# Patient Record
Sex: Male | Born: 1945 | Race: White | Hispanic: No | Marital: Single | Smoking: Former smoker
Health system: Southern US, Community
[De-identification: ages and names within clinical notes are randomized; demographics above are authoritative.]

## PROBLEM LIST (undated history)

## (undated) DIAGNOSIS — D649 Anemia, unspecified: Secondary | ICD-10-CM

## (undated) DIAGNOSIS — M1990 Unspecified osteoarthritis, unspecified site: Secondary | ICD-10-CM

## (undated) DIAGNOSIS — B1920 Unspecified viral hepatitis C without hepatic coma: Secondary | ICD-10-CM

## (undated) DIAGNOSIS — K7460 Unspecified cirrhosis of liver: Secondary | ICD-10-CM

## (undated) DIAGNOSIS — K219 Gastro-esophageal reflux disease without esophagitis: Secondary | ICD-10-CM

## (undated) DIAGNOSIS — I739 Peripheral vascular disease, unspecified: Secondary | ICD-10-CM

## (undated) DIAGNOSIS — Z973 Presence of spectacles and contact lenses: Secondary | ICD-10-CM

## (undated) DIAGNOSIS — I10 Essential (primary) hypertension: Secondary | ICD-10-CM

## (undated) DIAGNOSIS — G47419 Narcolepsy without cataplexy: Secondary | ICD-10-CM

## (undated) DIAGNOSIS — R7989 Other specified abnormal findings of blood chemistry: Secondary | ICD-10-CM

## (undated) DIAGNOSIS — Z972 Presence of dental prosthetic device (complete) (partial): Secondary | ICD-10-CM

## (undated) HISTORY — PX: TONSILLECTOMY: SUR1361

## (undated) HISTORY — PX: UPPER GI ENDOSCOPY: SHX6162

## (undated) HISTORY — PX: EYE SURGERY: SHX253

## (undated) HISTORY — PX: COLONOSCOPY: SHX174

## (undated) HISTORY — PX: APPENDECTOMY: SHX54

---
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
## 2000-08-25 ENCOUNTER — Ambulatory Visit (HOSPITAL_BASED_OUTPATIENT_CLINIC_OR_DEPARTMENT_OTHER): Admission: RE | Admit: 2000-08-25 | Discharge: 2000-08-25 | Payer: Self-pay | Admitting: Internal Medicine

## 2002-01-25 ENCOUNTER — Encounter: Payer: Self-pay | Admitting: *Deleted

## 2002-01-25 ENCOUNTER — Encounter (INDEPENDENT_AMBULATORY_CARE_PROVIDER_SITE_OTHER): Payer: Self-pay | Admitting: *Deleted

## 2002-01-25 ENCOUNTER — Ambulatory Visit (HOSPITAL_COMMUNITY): Admission: RE | Admit: 2002-01-25 | Discharge: 2002-01-25 | Payer: Self-pay | Admitting: *Deleted

## 2004-11-17 ENCOUNTER — Ambulatory Visit: Payer: Self-pay | Admitting: Internal Medicine

## 2004-12-02 ENCOUNTER — Ambulatory Visit (HOSPITAL_COMMUNITY): Admission: RE | Admit: 2004-12-02 | Discharge: 2004-12-02 | Payer: Self-pay | Admitting: Internal Medicine

## 2004-12-14 ENCOUNTER — Ambulatory Visit: Payer: Self-pay | Admitting: Internal Medicine

## 2005-01-07 ENCOUNTER — Ambulatory Visit: Payer: Self-pay | Admitting: Gastroenterology

## 2005-01-21 ENCOUNTER — Ambulatory Visit: Payer: Self-pay | Admitting: Gastroenterology

## 2005-02-04 ENCOUNTER — Ambulatory Visit: Payer: Self-pay | Admitting: Internal Medicine

## 2005-02-18 ENCOUNTER — Ambulatory Visit: Payer: Self-pay | Admitting: Internal Medicine

## 2005-03-18 ENCOUNTER — Ambulatory Visit: Payer: Self-pay | Admitting: Gastroenterology

## 2005-04-15 ENCOUNTER — Ambulatory Visit: Payer: Self-pay | Admitting: Gastroenterology

## 2005-05-13 ENCOUNTER — Ambulatory Visit: Payer: Self-pay | Admitting: Gastroenterology

## 2005-05-31 ENCOUNTER — Ambulatory Visit (HOSPITAL_COMMUNITY): Admission: RE | Admit: 2005-05-31 | Discharge: 2005-05-31 | Payer: Self-pay | Admitting: Obstetrics and Gynecology

## 2005-06-10 ENCOUNTER — Ambulatory Visit: Payer: Self-pay | Admitting: Gastroenterology

## 2005-07-08 ENCOUNTER — Ambulatory Visit: Payer: Self-pay | Admitting: Gastroenterology

## 2005-08-18 ENCOUNTER — Ambulatory Visit: Payer: Self-pay | Admitting: Oncology

## 2010-01-21 ENCOUNTER — Ambulatory Visit: Payer: Self-pay | Admitting: Oncology

## 2010-01-27 LAB — CBC WITH DIFFERENTIAL/PLATELET
BASO%: 0.4 % (ref 0.0–2.0)
Basophils Absolute: 0 10*3/uL (ref 0.0–0.1)
EOS%: 2.4 % (ref 0.0–7.0)
Eosinophils Absolute: 0.1 10*3/uL (ref 0.0–0.5)
HCT: 41.8 % (ref 38.4–49.9)
HGB: 14.9 g/dL (ref 13.0–17.1)
LYMPH%: 28.9 % (ref 14.0–49.0)
MCH: 35.2 pg — ABNORMAL HIGH (ref 27.2–33.4)
MCHC: 35.6 g/dL (ref 32.0–36.0)
MCV: 98.8 fL — ABNORMAL HIGH (ref 79.3–98.0)
MONO#: 0.3 10*3/uL (ref 0.1–0.9)
MONO%: 8 % (ref 0.0–14.0)
NEUT#: 2.4 10*3/uL (ref 1.5–6.5)
NEUT%: 60.3 % (ref 39.0–75.0)
Platelets: 53 10*3/uL — ABNORMAL LOW (ref 140–400)
RBC: 4.23 10*6/uL (ref 4.20–5.82)
RDW: 14 % (ref 11.0–14.6)
WBC: 4 10*3/uL (ref 4.0–10.3)
lymph#: 1.1 10*3/uL (ref 0.9–3.3)

## 2010-01-27 LAB — MORPHOLOGY
Other Comments: NONE SEEN
PLT EST: DECREASED

## 2010-01-28 LAB — COMPREHENSIVE METABOLIC PANEL
ALT: 86 U/L — ABNORMAL HIGH (ref 0–53)
AST: 63 U/L — ABNORMAL HIGH (ref 0–37)
Albumin: 3.7 g/dL (ref 3.5–5.2)
BUN: 20 mg/dL (ref 6–23)
CO2: 28 mEq/L (ref 19–32)
Calcium: 8.4 mg/dL (ref 8.4–10.5)
Chloride: 103 mEq/L (ref 96–112)
Creatinine, Ser: 1.21 mg/dL (ref 0.40–1.50)
Potassium: 3.8 mEq/L (ref 3.5–5.3)

## 2010-01-28 LAB — LACTATE DEHYDROGENASE: LDH: 160 U/L (ref 94–250)

## 2010-01-28 LAB — ANA: Anti Nuclear Antibody(ANA): NEGATIVE

## 2010-02-03 ENCOUNTER — Ambulatory Visit (HOSPITAL_COMMUNITY): Admission: RE | Admit: 2010-02-03 | Discharge: 2010-02-03 | Payer: Self-pay | Admitting: Oncology

## 2010-04-17 ENCOUNTER — Ambulatory Visit: Payer: Self-pay | Admitting: Gastroenterology

## 2010-07-30 ENCOUNTER — Ambulatory Visit: Payer: Self-pay | Admitting: Oncology

## 2010-08-13 ENCOUNTER — Ambulatory Visit: Payer: Self-pay | Admitting: Gastroenterology

## 2010-08-25 ENCOUNTER — Ambulatory Visit (HOSPITAL_COMMUNITY): Admission: RE | Admit: 2010-08-25 | Discharge: 2010-08-25 | Payer: Self-pay | Admitting: Gastroenterology

## 2011-02-04 ENCOUNTER — Ambulatory Visit (INDEPENDENT_AMBULATORY_CARE_PROVIDER_SITE_OTHER): Payer: BLUE CROSS/BLUE SHIELD | Admitting: Gastroenterology

## 2011-02-04 DIAGNOSIS — K7460 Unspecified cirrhosis of liver: Secondary | ICD-10-CM

## 2011-02-04 DIAGNOSIS — B182 Chronic viral hepatitis C: Secondary | ICD-10-CM

## 2011-03-25 ENCOUNTER — Other Ambulatory Visit: Payer: Self-pay

## 2012-02-22 ENCOUNTER — Other Ambulatory Visit: Payer: Self-pay | Admitting: Dermatology

## 2012-06-14 ENCOUNTER — Other Ambulatory Visit: Payer: Self-pay | Admitting: Internal Medicine

## 2012-06-14 DIAGNOSIS — B182 Chronic viral hepatitis C: Secondary | ICD-10-CM

## 2012-06-19 ENCOUNTER — Other Ambulatory Visit: Payer: Self-pay | Admitting: Dermatology

## 2012-06-20 ENCOUNTER — Other Ambulatory Visit: Payer: BLUE CROSS/BLUE SHIELD

## 2012-06-27 ENCOUNTER — Ambulatory Visit
Admission: RE | Admit: 2012-06-27 | Discharge: 2012-06-27 | Disposition: A | Discharge status: Home / Self Care | Payer: BLUE CROSS/BLUE SHIELD | Source: Ambulatory Visit | Attending: Internal Medicine | Admitting: Internal Medicine

## 2012-06-27 DIAGNOSIS — B182 Chronic viral hepatitis C: Secondary | ICD-10-CM

## 2012-08-16 ENCOUNTER — Emergency Department (HOSPITAL_COMMUNITY)
Admission: EM | Admit: 2012-08-16 | Discharge: 2012-08-16 | Disposition: A | Discharge status: Home / Self Care | Payer: BC Managed Care – PPO | Attending: Emergency Medicine | Admitting: Emergency Medicine

## 2012-08-16 ENCOUNTER — Encounter (HOSPITAL_COMMUNITY): Payer: Self-pay

## 2012-08-16 DIAGNOSIS — H538 Other visual disturbances: Secondary | ICD-10-CM | POA: Insufficient documentation

## 2012-08-16 DIAGNOSIS — H539 Unspecified visual disturbance: Secondary | ICD-10-CM

## 2012-08-16 DIAGNOSIS — Z79899 Other long term (current) drug therapy: Secondary | ICD-10-CM | POA: Insufficient documentation

## 2012-08-16 DIAGNOSIS — B1710 Acute hepatitis C without hepatic coma: Secondary | ICD-10-CM | POA: Insufficient documentation

## 2012-08-16 DIAGNOSIS — Z8739 Personal history of other diseases of the musculoskeletal system and connective tissue: Secondary | ICD-10-CM | POA: Insufficient documentation

## 2012-08-16 DIAGNOSIS — F17200 Nicotine dependence, unspecified, uncomplicated: Secondary | ICD-10-CM | POA: Insufficient documentation

## 2012-08-16 HISTORY — DX: Unspecified viral hepatitis C without hepatic coma: B19.20

## 2012-08-16 HISTORY — DX: Unspecified osteoarthritis, unspecified site: M19.90

## 2012-08-16 NOTE — ED Notes (Signed)
Pt states that motor skills have not been effected.
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
## 2012-08-16 NOTE — ED Notes (Signed)
Pt ambulatory leaving ED by self. Pt given discharge instructions. Pt does not have any furhter questions upon discharge. Pt does not appear to be in any acute distress.

## 2012-08-16 NOTE — ED Notes (Signed)
Pt states peripheral vision is impaired on right side. Pt states vision currently "more than adequate." Pt denies dizziness and  lightheadedness. Pt states little nausea earlier today. Pt denies pain.

## 2012-08-16 NOTE — ED Notes (Signed)
C. Schinlever, PA at bedside.  

## 2012-08-16 NOTE — ED Notes (Signed)
Pt reports blurry vision to (R) peripheral field starting yesterday. Pt reports he was on the computer yesterday when he noticed his sight was originally blurry then would gradually become clear. Pt also reports some difficulty w/typing yesterday. Pt denies slurred speech, dizziness, or headache. No neuro deficits noted, grips and strengths equal bilaterally
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
## 2013-05-23 ENCOUNTER — Other Ambulatory Visit: Payer: Self-pay | Admitting: Gastroenterology

## 2013-05-23 DIAGNOSIS — B182 Chronic viral hepatitis C: Secondary | ICD-10-CM

## 2013-05-23 DIAGNOSIS — K7460 Unspecified cirrhosis of liver: Secondary | ICD-10-CM

## 2013-06-04 ENCOUNTER — Ambulatory Visit
Admission: RE | Admit: 2013-06-04 | Discharge: 2013-06-04 | Disposition: A | Discharge status: Home / Self Care | Payer: BLUE CROSS/BLUE SHIELD | Source: Ambulatory Visit | Attending: Gastroenterology | Admitting: Gastroenterology

## 2013-06-04 DIAGNOSIS — B182 Chronic viral hepatitis C: Secondary | ICD-10-CM

## 2013-06-04 DIAGNOSIS — K7460 Unspecified cirrhosis of liver: Secondary | ICD-10-CM

## 2013-07-27 ENCOUNTER — Encounter (HOSPITAL_BASED_OUTPATIENT_CLINIC_OR_DEPARTMENT_OTHER): Payer: Self-pay | Admitting: *Deleted

## 2013-07-27 NOTE — Progress Notes (Signed)
Pt long hx cirrhosis-hepc-was treated To come in for bmet To bring all meds and overnight bag in case he has to stay

## 2013-07-30 ENCOUNTER — Other Ambulatory Visit: Payer: Self-pay | Admitting: Orthopedic Surgery

## 2013-08-03 NOTE — Progress Notes (Signed)
Surgery r/s from 08/02/13 to 08/09/13=pt saw pcp 08/02/13-labs done will get new labs preop reviewed

## 2013-08-07 ENCOUNTER — Other Ambulatory Visit: Payer: Self-pay | Admitting: Orthopedic Surgery

## 2013-08-08 NOTE — H&P (Signed)
Lareen Mullings/[facility] [address]   [address] [place], [place] [address] [phone] A Division of [facility]  Loreta Ave, M.D.   Robert A. Thurston Hole, M.D.   Burnell Blanks, M.D.   Eulas Post, M.D.   Lunette Stands, M.D Buford Dresser, M.D.  Charlsie Quest, M.D.   Estell Harpin, M.D.   Melina Fiddler, M.D. Genene Churn. Barry Dienes, PA-C            Kirstin A. Shepperson, PA-C Josh Media, PA-C [place], [place]   RE: Amarrion, Pastorino                                [phone]      DOB: [birth year] PROGRESS NOTE: 02-13-13 Elgie comes in for an acute injury to his left shoulder.  He was lifting a heavy object and felt pain and tearing sensation in his left shoulder referred down to the deltoid attachment.  Pain also down his biceps, which continues to be painful.  This has been three weeks.  He is getting worse rather than better.  Some help with anti-inflammatories.  He has not been able to really get his arm overhead since his initial injury.  No neck pain.  No radicular complaints.  Prior to this event no significant problems with his shoulder.  He comes in for evaluation and treatment recommendation.  He is a healthy, active 67 year-old male.   Status post right radial tunnel release by me back the end of last year.  Doing well without any issues.  Definitely improved after that procedure. Remaining history is reviewed, updated and included in the chart.   EXAMINATION: General exam is outlined and included in the chart.  Specifically, he has a virtual drop arm on the left.  Marked cuff weakness.  Also a little bit of biceps weakness, but I think the long head is still intact.  Positive impingement.  No instability.  No dramatic atrophy.  Passively I can get him through fairly good motion, but actively he cannot get his arm overhead with abduction.  On the right his incision from radial tunnel release has healed.  No residual  symptoms.    X-RAYS: Three view x-ray of the injured left shoulder shows a Type III acromion.  Degenerative changes in the [facility] joint.  Glenohumeral joint and subacromial space reasonable.    DISPOSITION:  I am very concerned he has a complete rotator cuff tear.  Rather than delaying diagnosis and treatment I would rather go ahead and get a scan now.  I have discussed this with him and based on his findings I think it is definitely indicated.  We will proceed with that and he will follow up with me when that is complete.  Tailor further treatment depending on what that shows.    Loreta Ave, M.D.   Electronically verified by Loreta Ave, M.D. DFM:jjh D 02-13-13 T 02-14-13  Devonda Pequignot/[facility] [address]   [address] [place], [place] [address] [phone] A Division of [facility]  Loreta Ave, M.D.   Robert A. Thurston Hole, M.D.   Burnell Blanks, M.D.   Eulas Post, M.D.   Lunette Stands, M.D Buford Dresser, M.D.  Charlsie Quest, M.D.   Estell Harpin, M.D.   Melina Fiddler, M.D. Genene Churn. Barry Dienes, PA-C  Kirstin A. Shepperson, PA-C Josh [place], PA-C [place], [place]   RE: Joelle, [place]                                [phone]      DOB: [birth year] PROGRESS NOTE: 02-16-13 Trestan returns for follow up.  I have gone over his MRI report.  Fortunately he does not have a full thickness tear.  There is cuff irritation, relatively mild.  Longitudinal tearing long head biceps, but not a complete tear.  I have gone over the MRI report and discussed it with him.  I also looked at the scan itself.  After thorough discussion we are going to try a subacromial injection to settle down inflammation and a Jobe exercise program.  We will see how he does.  Progress with activities as tolerated.  He will let me know if things don't improve.  We have also talked about potential progression to rupturing long head biceps  tendon and what would occur if that happened.  He understands and agrees.    PROCEDURE NOTE: The patient's clinical condition is marked by substantial pain and/or significant functional disability.  Other conservative therapy has not provided relief, is contraindicated, or not appropriate.  There is a reasonable likelihood that injection will significantly improve the patient's pain and/or functional disability. After appropriate consent and under sterile technique subacromial injection of the left shoulder with Depo-Medrol/Marcaine.  Tolerated this well.    Loreta Ave, M.D.   Electronically verified by Loreta Ave, M.D. DFM:jjh D 02-16-13 T 02-19-13  Atisha Hamidi/[facility] [address]   [address] [place], [place] [address] [phone] A Division of [facility]  Loreta Ave, M.D.   Robert A. Thurston Hole, M.D.   Burnell Blanks, M.D.   Eulas Post, M.D.   Lunette Stands, M.D Jewel Baize. Eulah Pont, M.D.  Buford Dresser, M.D.  Charlsie Quest, M.D.  Estell Harpin, M.D.   Melina Fiddler, M.D. Danford Bad. Willa Rough, PA-C  Kirstin A. Shepperson, PA-C  Josh Toyah, PA-C [place], [place]   RE: Jesson, [place]                                [phone]      DOB: 11-02-1969 PROGRESS NOTE: 07-20-13 Vilas comes in for his left shoulder.  Cortisone injection we did back in August helped for a week, nothing longer.  Pain has returned.  His last injection helped for more than four months.  He has rotator cuff impingement signs and symptoms, as well as marked biceps irritation.  He has reached a point that he would like to consider definitive treatment.  This is something we have discussed in the past.  Previous evaluation with x-rays and MRI scan.  Picture of impingement, rotator cuff tendinopathy, but he does not have a full thickness cuff tear.   History, workup and treatment to date is reviewed and discussed with  him.  EXAMINATION: General exam is outlined and included in the chart.  He continues to have fairly good motion.  Positive impingement.  Positive palms down abduction.  Biceps irritation, but it is not completely torn.  Neurovascularly intact.     DISPOSITION:  We have discussed definitive treatment.  Exam under anesthesia, arthroscopy, decompression.  If there is significant biceps pathology my approach would be an  arthroscopic release, not an extraarticular tenodesis, given his age of 35.  Obviously repair his cuff if a full thickness tear is found.  All of this discussed face-to-face, more than 25 minutes.  Paperwork complete.  All questions answered.  Procedure, risks, benefits and complications reviewed in detail.  I will see him at the time of operative intervention.    Loreta Ave, M.D.   Electronically verified by Loreta Ave, M.D. DFM:jjh D 07-20-13 T 07-23-13

## 2013-08-09 ENCOUNTER — Ambulatory Visit (HOSPITAL_BASED_OUTPATIENT_CLINIC_OR_DEPARTMENT_OTHER): Payer: BC Managed Care – PPO | Admitting: Anesthesiology

## 2013-08-09 ENCOUNTER — Encounter (HOSPITAL_BASED_OUTPATIENT_CLINIC_OR_DEPARTMENT_OTHER): Payer: Self-pay

## 2013-08-09 ENCOUNTER — Encounter (HOSPITAL_BASED_OUTPATIENT_CLINIC_OR_DEPARTMENT_OTHER)
Admission: RE | Disposition: A | Discharge status: Home / Self Care | Payer: Self-pay | Source: Ambulatory Visit | Attending: Orthopedic Surgery

## 2013-08-09 ENCOUNTER — Encounter (HOSPITAL_BASED_OUTPATIENT_CLINIC_OR_DEPARTMENT_OTHER): Payer: BC Managed Care – PPO | Admitting: Anesthesiology

## 2013-08-09 ENCOUNTER — Ambulatory Visit (HOSPITAL_BASED_OUTPATIENT_CLINIC_OR_DEPARTMENT_OTHER)
Admission: RE | Admit: 2013-08-09 | Discharge: 2013-08-09 | Disposition: A | Discharge status: Home / Self Care | Payer: BC Managed Care – PPO | Source: Ambulatory Visit | Attending: Orthopedic Surgery | Admitting: Orthopedic Surgery

## 2013-08-09 DIAGNOSIS — M7500 Adhesive capsulitis of unspecified shoulder: Secondary | ICD-10-CM | POA: Insufficient documentation

## 2013-08-09 DIAGNOSIS — B1920 Unspecified viral hepatitis C without hepatic coma: Secondary | ICD-10-CM | POA: Insufficient documentation

## 2013-08-09 DIAGNOSIS — M19019 Primary osteoarthritis, unspecified shoulder: Secondary | ICD-10-CM | POA: Insufficient documentation

## 2013-08-09 DIAGNOSIS — I10 Essential (primary) hypertension: Secondary | ICD-10-CM | POA: Insufficient documentation

## 2013-08-09 DIAGNOSIS — F17200 Nicotine dependence, unspecified, uncomplicated: Secondary | ICD-10-CM | POA: Insufficient documentation

## 2013-08-09 DIAGNOSIS — D696 Thrombocytopenia, unspecified: Secondary | ICD-10-CM | POA: Insufficient documentation

## 2013-08-09 DIAGNOSIS — K7460 Unspecified cirrhosis of liver: Secondary | ICD-10-CM | POA: Insufficient documentation

## 2013-08-09 DIAGNOSIS — M25512 Pain in left shoulder: Secondary | ICD-10-CM

## 2013-08-09 DIAGNOSIS — M25819 Other specified joint disorders, unspecified shoulder: Secondary | ICD-10-CM | POA: Insufficient documentation

## 2013-08-09 HISTORY — DX: Presence of dental prosthetic device (complete) (partial): Z97.2

## 2013-08-09 HISTORY — PX: SHOULDER ARTHROSCOPY WITH SUBACROMIAL DECOMPRESSION, ROTATOR CUFF REPAIR AND BICEP TENDON REPAIR: SHX5687

## 2013-08-09 HISTORY — DX: Essential (primary) hypertension: I10

## 2013-08-09 HISTORY — DX: Anemia, unspecified: D64.9

## 2013-08-09 HISTORY — DX: Presence of spectacles and contact lenses: Z97.3

## 2013-08-09 LAB — POCT I-STAT, CHEM 8
BUN: 23 mg/dL (ref 6–23)
Chloride: 101 mEq/L (ref 96–112)
Creatinine, Ser: 1.4 mg/dL — ABNORMAL HIGH (ref 0.50–1.35)
Glucose, Bld: 90 mg/dL (ref 70–99)
Hemoglobin: 14.6 g/dL (ref 13.0–17.0)
Potassium: 4.1 mEq/L (ref 3.5–5.1)

## 2013-08-09 LAB — CBC WITH DIFFERENTIAL/PLATELET
Eosinophils Absolute: 0.3 10*3/uL (ref 0.0–0.7)
Eosinophils Relative: 4 % (ref 0–5)
Hemoglobin: 14.5 g/dL (ref 13.0–17.0)
Lymphs Abs: 1.9 10*3/uL (ref 0.7–4.0)
MCH: 34.9 pg — ABNORMAL HIGH (ref 26.0–34.0)
MCV: 96.6 fL (ref 78.0–100.0)
Monocytes Relative: 8 % (ref 3–12)
Neutro Abs: 3.9 10*3/uL (ref 1.7–7.7)
Neutrophils Relative %: 59 % (ref 43–77)
RBC: 4.16 MIL/uL — ABNORMAL LOW (ref 4.22–5.81)

## 2013-08-09 SURGERY — SHOULDER ARTHROSCOPY WITH SUBACROMIAL DECOMPRESSION, ROTATOR CUFF REPAIR AND BICEP TENDON REPAIR
Anesthesia: General | Site: Shoulder | Laterality: Left | Wound class: Clean

## 2013-08-09 MED ORDER — DEXAMETHASONE SODIUM PHOSPHATE 4 MG/ML IJ SOLN
INTRAMUSCULAR | Status: DC | PRN
  Administered 2013-08-09: 10 mg via INTRAVENOUS

## 2013-08-09 MED ORDER — ACETAMINOPHEN 500 MG PO TABS
ORAL_TABLET | ORAL | Status: AC
  Filled 2013-08-09: qty 2

## 2013-08-09 MED ORDER — OXYCODONE HCL 5 MG PO TABS: 5.0000 mg | ORAL_TABLET | Freq: Once | ORAL | Status: DC | PRN

## 2013-08-09 MED ORDER — CEFAZOLIN SODIUM-DEXTROSE 2-3 GM-% IV SOLR: 2.0000 g | INTRAVENOUS | Status: DC

## 2013-08-09 MED ORDER — MIDAZOLAM HCL 2 MG/2ML IJ SOLN
INTRAMUSCULAR | Status: AC
  Filled 2013-08-09: qty 2

## 2013-08-09 MED ORDER — CHLORHEXIDINE GLUCONATE 4 % EX LIQD: 60.0000 mL | Freq: Once | CUTANEOUS | Status: DC

## 2013-08-09 MED ORDER — LACTATED RINGERS IV SOLN
INTRAVENOUS | Status: DC
  Administered 2013-08-09: 09:00:00 via INTRAVENOUS

## 2013-08-09 MED ORDER — MIDAZOLAM HCL 2 MG/2ML IJ SOLN
1.0000 mg | INTRAMUSCULAR | Status: DC | PRN
  Administered 2013-08-09: 2 mg via INTRAVENOUS

## 2013-08-09 MED ORDER — FENTANYL CITRATE 0.05 MG/ML IJ SOLN
INTRAMUSCULAR | Status: AC
  Filled 2013-08-09: qty 2

## 2013-08-09 MED ORDER — OXYCODONE HCL 5 MG/5ML PO SOLN: 5.0000 mg | Freq: Once | ORAL | Status: DC | PRN

## 2013-08-09 MED ORDER — OXYCODONE HCL 5 MG PO TABS: 5.0000 mg | ORAL_TABLET | ORAL | Status: DC | PRN

## 2013-08-09 MED ORDER — DEXTROSE-NACL 5-0.45 % IV SOLN: INTRAVENOUS | Status: DC

## 2013-08-09 MED ORDER — CEFAZOLIN SODIUM-DEXTROSE 2-3 GM-% IV SOLR
2.0000 g | INTRAVENOUS | Status: AC
  Administered 2013-08-09: 2 g via INTRAVENOUS

## 2013-08-09 MED ORDER — ACETAMINOPHEN 500 MG PO TABS: 1000.0000 mg | ORAL_TABLET | Freq: Once | ORAL | Status: DC

## 2013-08-09 MED ORDER — MIDAZOLAM HCL 2 MG/2ML IJ SOLN: 0.5000 mg | Freq: Once | INTRAMUSCULAR | Status: DC | PRN

## 2013-08-09 MED ORDER — PROMETHAZINE HCL 25 MG/ML IJ SOLN: 6.2500 mg | INTRAMUSCULAR | Status: DC | PRN

## 2013-08-09 MED ORDER — PROPOFOL 10 MG/ML IV BOLUS
INTRAVENOUS | Status: DC | PRN
  Administered 2013-08-09: 100 mg via INTRAVENOUS

## 2013-08-09 MED ORDER — BUPIVACAINE-EPINEPHRINE PF 0.5-1:200000 % IJ SOLN
INTRAMUSCULAR | Status: DC | PRN
  Administered 2013-08-09: 30 mL

## 2013-08-09 MED ORDER — FENTANYL CITRATE 0.05 MG/ML IJ SOLN
50.0000 ug | INTRAMUSCULAR | Status: DC | PRN
  Administered 2013-08-09: 100 ug via INTRAVENOUS

## 2013-08-09 MED ORDER — HYDRALAZINE HCL 20 MG/ML IJ SOLN
INTRAMUSCULAR | Status: DC | PRN
  Administered 2013-08-09: 5 mg via INTRAVENOUS

## 2013-08-09 MED ORDER — CEFAZOLIN SODIUM-DEXTROSE 2-3 GM-% IV SOLR
INTRAVENOUS | Status: AC
  Filled 2013-08-09: qty 50

## 2013-08-09 MED ORDER — FENTANYL CITRATE 0.05 MG/ML IJ SOLN
INTRAMUSCULAR | Status: DC | PRN
  Administered 2013-08-09: 25 ug via INTRAVENOUS

## 2013-08-09 MED ORDER — MEPERIDINE HCL 25 MG/ML IJ SOLN: 6.2500 mg | INTRAMUSCULAR | Status: DC | PRN

## 2013-08-09 MED ORDER — FENTANYL CITRATE 0.05 MG/ML IJ SOLN: 25.0000 ug | INTRAMUSCULAR | Status: DC | PRN

## 2013-08-09 MED ORDER — SUCCINYLCHOLINE CHLORIDE 20 MG/ML IJ SOLN
INTRAMUSCULAR | Status: DC | PRN
  Administered 2013-08-09: 100 mg via INTRAVENOUS

## 2013-08-09 SURGICAL SUPPLY — 69 items
BENZOIN TINCTURE PRP APPL 2/3 (GAUZE/BANDAGES/DRESSINGS) IMPLANT
BLADE CUTTER GATOR 3.5 (BLADE) ×2 IMPLANT
BLADE CUTTER MENIS 5.5 (BLADE) IMPLANT
BLADE GREAT WHITE 4.2 (BLADE) ×2 IMPLANT
BLADE SURG 15 STRL LF DISP TIS (BLADE) ×1 IMPLANT
BLADE SURG 15 STRL SS (BLADE) ×1
BUR OVAL 6.0 (BURR) ×2 IMPLANT
CANISTER OMNI JUG 16 LITER (MISCELLANEOUS) ×2 IMPLANT
CANISTER SUCT 3000ML (MISCELLANEOUS) IMPLANT
CANNULA DRY DOC 8X75 (CANNULA) IMPLANT
CANNULA TWIST IN 8.25X7CM (CANNULA) IMPLANT
CLOTH BEACON ORANGE TIMEOUT ST (SAFETY) ×2 IMPLANT
DECANTER SPIKE VIAL GLASS SM (MISCELLANEOUS) IMPLANT
DRAPE STERI 35X30 U-POUCH (DRAPES) ×2 IMPLANT
DRAPE U-SHAPE 47X51 STRL (DRAPES) ×2 IMPLANT
DRAPE U-SHAPE 76X120 STRL (DRAPES) ×4 IMPLANT
DRSG PAD ABDOMINAL 8X10 ST (GAUZE/BANDAGES/DRESSINGS) ×2 IMPLANT
DURAPREP 26ML APPLICATOR (WOUND CARE) ×2 IMPLANT
ELECT MENISCUS 165MM 90D (ELECTRODE) ×2 IMPLANT
ELECT NEEDLE TIP 2.8 STRL (NEEDLE) IMPLANT
ELECT REM PT RETURN 9FT ADLT (ELECTROSURGICAL) ×2
ELECTRODE REM PT RTRN 9FT ADLT (ELECTROSURGICAL) ×1 IMPLANT
GAUZE XEROFORM 1X8 LF (GAUZE/BANDAGES/DRESSINGS) ×2 IMPLANT
GLOVE BIO SURGEON STRL SZ 6.5 (GLOVE) ×2 IMPLANT
GLOVE BIO SURGEON STRL SZ8 (GLOVE) ×2 IMPLANT
GLOVE BIOGEL PI IND STRL 7.0 (GLOVE) ×1 IMPLANT
GLOVE BIOGEL PI IND STRL 8.5 (GLOVE) ×1 IMPLANT
GLOVE BIOGEL PI INDICATOR 7.0 (GLOVE) ×1
GLOVE BIOGEL PI INDICATOR 8.5 (GLOVE) ×1
GLOVE ORTHO TXT STRL SZ7.5 (GLOVE) ×2 IMPLANT
GOWN PREVENTION PLUS XLARGE (GOWN DISPOSABLE) ×2 IMPLANT
GOWN STRL REIN 2XL XLG LVL4 (GOWN DISPOSABLE) ×2 IMPLANT
IV NS IRRIG 3000ML ARTHROMATIC (IV SOLUTION) ×8 IMPLANT
NDL SUT 6 .5 CRC .975X.05 MAYO (NEEDLE) IMPLANT
NEEDLE MAYO TAPER (NEEDLE)
NEEDLE SCORPION MULTI FIRE (NEEDLE) IMPLANT
NS IRRIG 1000ML POUR BTL (IV SOLUTION) IMPLANT
PACK ARTHROSCOPY DSU (CUSTOM PROCEDURE TRAY) ×2 IMPLANT
PACK BASIN DAY SURGERY FS (CUSTOM PROCEDURE TRAY) ×2 IMPLANT
PASSER SUT SWANSON 36MM LOOP (INSTRUMENTS) IMPLANT
PENCIL BUTTON HOLSTER BLD 10FT (ELECTRODE) ×2 IMPLANT
SET ARTHROSCOPY TUBING (MISCELLANEOUS) ×1
SET ARTHROSCOPY TUBING LN (MISCELLANEOUS) ×1 IMPLANT
SLEEVE SCD COMPRESS KNEE MED (MISCELLANEOUS) IMPLANT
SLING ARM FOAM STRAP LRG (SOFTGOODS) IMPLANT
SLING ARM FOAM STRAP MED (SOFTGOODS) IMPLANT
SLING ARM FOAM STRAP XLG (SOFTGOODS) IMPLANT
SLING ARM IMMOBILIZER LRG (SOFTGOODS) IMPLANT
SLING ARM IMMOBILIZER MED (SOFTGOODS) IMPLANT
SPONGE GAUZE 4X4 12PLY (GAUZE/BANDAGES/DRESSINGS) ×4 IMPLANT
SPONGE LAP 4X18 X RAY DECT (DISPOSABLE) IMPLANT
STRIP CLOSURE SKIN 1/2X4 (GAUZE/BANDAGES/DRESSINGS) IMPLANT
SUCTION FRAZIER TIP 10 FR DISP (SUCTIONS) IMPLANT
SUT ETHIBOND 2 OS 4 DA (SUTURE) IMPLANT
SUT ETHILON 2 0 FS 18 (SUTURE) IMPLANT
SUT ETHILON 3 0 PS 1 (SUTURE) IMPLANT
SUT FIBERWIRE #2 38 T-5 BLUE (SUTURE)
SUT RETRIEVER MED (INSTRUMENTS) IMPLANT
SUT TIGER TAPE 7 IN WHITE (SUTURE) IMPLANT
SUT VIC AB 0 CT1 27 (SUTURE)
SUT VIC AB 0 CT1 27XBRD ANBCTR (SUTURE) IMPLANT
SUT VIC AB 2-0 SH 27 (SUTURE)
SUT VIC AB 2-0 SH 27XBRD (SUTURE) IMPLANT
SUT VIC AB 3-0 FS2 27 (SUTURE) IMPLANT
SUTURE FIBERWR #2 38 T-5 BLUE (SUTURE) IMPLANT
TAPE FIBER 2MM 7IN #2 BLUE (SUTURE) IMPLANT
TOWEL OR 17X24 6PK STRL BLUE (TOWEL DISPOSABLE) ×2 IMPLANT
WATER STERILE IRR 1000ML POUR (IV SOLUTION) ×2 IMPLANT
YANKAUER SUCT BULB TIP NO VENT (SUCTIONS) IMPLANT

## 2013-08-09 NOTE — Transfer of Care (Signed)
Immediate Anesthesia Transfer of Care Note  Patient: Joshua [name]  Procedure(s) Performed: Procedure(s): LEFT SHOULDER ARTHROSCOPY WITH SUBACROMIAL DECOMPRESSION, DISTAL CLAVICULECTOMY (Left)  Patient Location: PACU  Anesthesia Type:General and GA combined with regional for post-op pain  Level of Consciousness: sedated  Airway & Oxygen Therapy: Patient Spontanous Breathing and Patient connected to face mask oxygen  Post-op Assessment: Report given to PACU RN and Post -op Vital signs reviewed and stable  Post vital signs: Reviewed and stable  Complications: No apparent anesthesia complications

## 2013-08-09 NOTE — Anesthesia Procedure Notes (Addendum)
Anesthesia Regional Block:  Interscalene brachial plexus block  Pre-Anesthetic Checklist: ,, timeout performed, Correct Patient, Correct Site, Correct Laterality, Correct Procedure, Correct Position, site marked, Risks and benefits discussed,  Surgical consent,  Pre-op evaluation,  At surgeon's request and post-op pain management  Laterality: Left  Prep: chloraprep       Needles:  Injection technique: Single-shot  Needle Type: Stimulator Needle - 40      Needle Gauge: 22 and 22 G    Additional Needles:  Procedures: nerve stimulator Interscalene brachial plexus block  Nerve Stimulator or Paresthesia:  Response: forearm twitch, 0.45 mA, 0.1 ms,   Additional Responses:   Narrative:  Start time: 08/09/2013 9:49 AM End time: 08/09/2013 9:58 AM Injection made incrementally with aspirations every 5 mL.  Performed by: Personally  Anesthesiologist: Sandford Craze, MD  Additional Notes: Pt identified in Holding room.  Monitors applied. Working IV access confirmed. Sterile prep L neck.  #22ga PNS to forearm twitch at 0.28mA threshold.  30cc 0.5% Bupivacaine with 1:200k epi injected incrementally after negative test dose.  Patient asymptomatic, VSS, no heme aspirated, tolerated well.  Sandford Craze MD  Interscalene brachial plexus block Procedure Name: Intubation Performed by: York Grice Pre-anesthesia Checklist: Patient identified, Timeout performed, Emergency Drugs available, Suction available and Patient being monitored Patient Re-evaluated:Patient Re-evaluated prior to inductionOxygen Delivery Method: Circle system utilized Preoxygenation: Pre-oxygenation with 100% oxygen Intubation Type: IV induction Ventilation: Mask ventilation without difficulty Laryngoscope Size: Miller and 2 Grade View: Grade I Tube type: Oral Tube size: 8.0 mm Number of attempts: 1 Placement Confirmation: ETT inserted through vocal cords under direct vision,  breath sounds checked- equal and bilateral  and positive ETCO2 Secured at: 23 cm Tube secured with: Tape Dental Injury: Teeth and Oropharynx as per pre-operative assessment

## 2013-08-09 NOTE — Anesthesia Preprocedure Evaluation (Addendum)
Anesthesia Evaluation  Patient identified by MRN, date of birth, ID band Patient awake    Reviewed: Allergy & Precautions, H&P , NPO status , Patient's Chart, lab work & pertinent test results  History of Anesthesia Complications Negative for: history of anesthetic complications  Airway Mallampati: I TM Distance: >3 FB Neck ROM: Full    Dental  (+) Partial Upper and Dental Advisory Given   Pulmonary Current Smoker,  breath sounds clear to auscultation  Pulmonary exam normal       Cardiovascular hypertension, Pt. on medications Rhythm:Regular Rate:Normal     Neuro/Psych negative neurological ROS     GI/Hepatic negative GI ROS, (+) Cirrhosis -       , Hepatitis -, C  Endo/Other  negative endocrine ROS  Renal/GU negative Renal ROS     Musculoskeletal   Abdominal   Peds  Hematology  (+) Blood dyscrasia (thrombocytopenia: plts 82K), ,   Anesthesia Other Findings   Reproductive/Obstetrics                         Anesthesia Physical Anesthesia Plan  ASA: III  Anesthesia Plan: General   Post-op Pain Management:    Induction: Intravenous  Airway Management Planned: Oral ETT  Additional Equipment:   Intra-op Plan:   Post-operative Plan: Extubation in OR  Informed Consent: I have reviewed the patients History and Physical, chart, labs and discussed the procedure including the risks, benefits and alternatives for the proposed anesthesia with the patient or authorized representative who has indicated his/her understanding and acceptance.   Dental advisory given  Plan Discussed with: CRNA and Surgeon  Anesthesia Plan Comments: (Plan routine monitors, GETA with interscalene block for post op analgesia)        Anesthesia Quick Evaluation

## 2013-08-09 NOTE — Anesthesia Postprocedure Evaluation (Signed)
  Anesthesia Post-op Note  Patient: Joshua [name]  Procedure(s) Performed: Procedure(s): LEFT SHOULDER ARTHROSCOPY WITH SUBACROMIAL DECOMPRESSION, DISTAL CLAVICULECTOMY (Left)  Patient Location: PACU  Anesthesia Type:GA combined with regional for post-op pain  Level of Consciousness: awake, alert , oriented and patient cooperative  Airway and Oxygen Therapy: Patient Spontanous Breathing  Post-op Pain: none  Post-op Assessment: Post-op Vital signs reviewed, Patient's Cardiovascular Status Stable, Respiratory Function Stable, Patent Airway, No signs of Nausea or vomiting and Pain level controlled  Post-op Vital Signs: Reviewed and stable  Complications: No apparent anesthesia complications

## 2013-08-09 NOTE — Progress Notes (Signed)
Assisted Dr. Jean Rosenthal with left, interscalene  block. Side rails up, monitors on throughout procedure. See vital signs in flow sheet. Tolerated Procedure well.Assisted Dr. Jean Rosenthal with left, interscalene  block. Side rails up, monitors on throughout procedure. See vital signs in flow sheet. Tolerated Procedure well.

## 2013-08-09 NOTE — Interval H&P Note (Signed)
History and Physical Interval Note:  08/09/2013 7:31 AM  Joshua [name]  has presented today for surgery, with the diagnosis of LEFT SHOULDER IMPINGEMENT SYNDROME DEGENERATIVE ARTHRITIS ROTATOR CUFF RUPTURE, BICIPITAL  The various methods of treatment have been discussed with the patient and family. After consideration of risks, benefits and other options for treatment, the patient has consented to  Procedure(s): LEFT SHOULDER ARTHROSCOPY WITH SUBACROMIAL DECOMPRESSION, DISTAL CLAVICULECTOMY, ROTATOR CUFF REPAIR AND BICEPS TENODESIS (Left) as a surgical intervention .  The patient's history has been reviewed, patient examined, no change in status, stable for surgery.  I have reviewed the patient's chart and labs.  Questions were answered to the patient's satisfaction.     Other Atienza F

## 2013-08-10 NOTE — Op Note (Signed)
Joshua, [name] NO.:  [id number]  MEDICAL RECORD NO.:  [id number]  LOCATION:                               FACILITY:  [facility]  PHYSICIAN:  Loreta Ave, M.D. DATE OF BIRTH:  [birth year]  DATE OF PROCEDURE:  08/09/2013 DATE OF DISCHARGE:  08/09/2013                              OPERATIVE REPORT   PREOPERATIVE DIAGNOSES:  Left shoulder long-standing impingement with tearing of long head biceps tendon.  Degenerative joint disease of acromioclavicular joint.  POSTOPERATIVE DIAGNOSIS:  Left shoulder long-standing impingement with tearing of long head biceps tendon.  Degenerative joint disease of acromioclavicular joint with evidence of recent completion of tearing of long head biceps tendon with a long retained intra-articular stump. Grade 2 changes of good humeral joint.  Degenerative tearing labrum. Partial tearing of rotator cuff above and below.  Mild adhesive capsulitis at terminal abduction and external rotation.  PROCEDURE:  Left shoulder exam, manipulation under anesthesia. Arthroscopy.  Debridement of labrum.  Resection.  Adhesion. Chondroplasty of humeral head.  Debridement of rotator cuff.  Resection of intra-articular portion of biceps tendon.  Acromioplasty.  CA ligament release.  Excision of distal clavicle.  SURGEON:  Loreta Ave, M.D.  ASSISTANT:  Domingo Cocking, PA, presents throughout the entire case, necessary for timely completion of procedure.  ANESTHESIA:  General.  BLOOD LOSS:  Minimal.  SPECIMENS:  None.  CULTURES:  None.  COMPLICATIONS:  None.  DRESSINGS:  Soft compressive with sling.  DESCRIPTION OF PROCEDURE:  The patient was brought to the operating room, placed on the operating table in supine position.  After adequate anesthesia had been obtained, shoulder examined.  Fairly good motion, but likely the terminal 15%, forward flexion, abduction and external rotation.  Manipulated, break-up adhesions achieving full  motion of stable shoulder.  Placed in beach-chair position on the shoulder positioner, prepped and draped in usual sterile fashion.  Three portals; anterior, posterior and lateral.  Arthroscope was introduced.  Shoulder was distended and inspected.  The retained biceps resected in its entirety.  Complex tearing of labrum debrided.  Chondroplasty for little roughening on the humeral head.  Undersurface of the cuff debrided. Although thinned, no full-thickness tears.  Cannula was redirected subacromially.  Adhesion reactive bursitis debrided.  Top of the cuff debrided.  No full-thickness tears.  Acromioplasty to a type 1 acromion releasing the CA ligament.  Periarticular spurs lateral centimeter of grade 4 changes in [facility] joint, treated with resection.  Adequacy of debridement.  Decompression confirmed viewing from all portals. Instruments and fluid were removed.  Portals were closed with nylon. Injected with Marcaine.  Sterile compressive dressing applied.  Sling applied.  Anesthesia reversed.  Brought to the recovery room.  Tolerated the surgery well.  No complications.     Loreta Ave, M.D.   ______________________________ Loreta Ave, M.D.    DFM/MEDQ  D:  08/09/2013  T:  08/10/2013  Job:  [id number]

## 2013-08-13 ENCOUNTER — Encounter (HOSPITAL_BASED_OUTPATIENT_CLINIC_OR_DEPARTMENT_OTHER): Payer: Self-pay | Admitting: Orthopedic Surgery

## 2013-08-30 ENCOUNTER — Other Ambulatory Visit: Payer: Self-pay

## 2013-12-07 ENCOUNTER — Other Ambulatory Visit: Payer: Self-pay | Admitting: Gastroenterology

## 2013-12-07 DIAGNOSIS — B182 Chronic viral hepatitis C: Secondary | ICD-10-CM

## 2013-12-07 DIAGNOSIS — K7460 Unspecified cirrhosis of liver: Secondary | ICD-10-CM

## 2013-12-12 ENCOUNTER — Ambulatory Visit
Admission: RE | Admit: 2013-12-12 | Discharge: 2013-12-12 | Disposition: A | Discharge status: Home / Self Care | Payer: BC Managed Care – PPO | Source: Ambulatory Visit | Attending: Gastroenterology | Admitting: Gastroenterology

## 2013-12-12 DIAGNOSIS — K7460 Unspecified cirrhosis of liver: Secondary | ICD-10-CM

## 2013-12-12 DIAGNOSIS — B182 Chronic viral hepatitis C: Secondary | ICD-10-CM

## 2014-02-11 ENCOUNTER — Emergency Department (HOSPITAL_COMMUNITY)
Admission: EM | Admit: 2014-02-11 | Discharge: 2014-02-11 | Disposition: A | Discharge status: Home / Self Care | Payer: BC Managed Care – PPO | Attending: Emergency Medicine | Admitting: Emergency Medicine

## 2014-02-11 ENCOUNTER — Emergency Department (HOSPITAL_COMMUNITY): Payer: BC Managed Care – PPO

## 2014-02-11 ENCOUNTER — Encounter (HOSPITAL_COMMUNITY): Payer: Self-pay | Admitting: Emergency Medicine

## 2014-02-11 DIAGNOSIS — Z8619 Personal history of other infectious and parasitic diseases: Secondary | ICD-10-CM | POA: Insufficient documentation

## 2014-02-11 DIAGNOSIS — L03119 Cellulitis of unspecified part of limb: Principal | ICD-10-CM

## 2014-02-11 DIAGNOSIS — R52 Pain, unspecified: Secondary | ICD-10-CM | POA: Insufficient documentation

## 2014-02-11 DIAGNOSIS — F17200 Nicotine dependence, unspecified, uncomplicated: Secondary | ICD-10-CM | POA: Insufficient documentation

## 2014-02-11 DIAGNOSIS — L03116 Cellulitis of left lower limb: Secondary | ICD-10-CM

## 2014-02-11 DIAGNOSIS — L02419 Cutaneous abscess of limb, unspecified: Secondary | ICD-10-CM | POA: Insufficient documentation

## 2014-02-11 DIAGNOSIS — M79609 Pain in unspecified limb: Secondary | ICD-10-CM | POA: Insufficient documentation

## 2014-02-11 DIAGNOSIS — Z862 Personal history of diseases of the blood and blood-forming organs and certain disorders involving the immune mechanism: Secondary | ICD-10-CM | POA: Insufficient documentation

## 2014-02-11 DIAGNOSIS — I10 Essential (primary) hypertension: Secondary | ICD-10-CM | POA: Insufficient documentation

## 2014-02-11 DIAGNOSIS — M129 Arthropathy, unspecified: Secondary | ICD-10-CM | POA: Insufficient documentation

## 2014-02-11 DIAGNOSIS — Z79899 Other long term (current) drug therapy: Secondary | ICD-10-CM | POA: Insufficient documentation

## 2014-02-11 LAB — COMPREHENSIVE METABOLIC PANEL
ALT: 17 U/L (ref 0–53)
AST: 31 U/L (ref 0–37)
Albumin: 2.8 g/dL — ABNORMAL LOW (ref 3.5–5.2)
Alkaline Phosphatase: 80 U/L (ref 39–117)
BUN: 53 mg/dL — AB (ref 6–23)
CHLORIDE: 101 meq/L (ref 96–112)
CO2: 23 mEq/L (ref 19–32)
CREATININE: 1.89 mg/dL — AB (ref 0.50–1.35)
Calcium: 8.6 mg/dL (ref 8.4–10.5)
GFR calc Af Amer: 40 mL/min — ABNORMAL LOW (ref >90)
GFR calc non Af Amer: 34 mL/min — ABNORMAL LOW (ref >90)
Glucose, Bld: 94 mg/dL (ref 70–99)
Potassium: 4.5 mEq/L (ref 3.7–5.3)
Sodium: 137 mEq/L (ref 137–147)
TOTAL PROTEIN: 6 g/dL (ref 6.0–8.3)
Total Bilirubin: 2.6 mg/dL — ABNORMAL HIGH (ref 0.3–1.2)

## 2014-02-11 LAB — CBC WITH DIFFERENTIAL/PLATELET
BASOS ABS: 0 10*3/uL (ref 0.0–0.1)
Basophils Relative: 0 % (ref 0–1)
EOS ABS: 0.1 10*3/uL (ref 0.0–0.7)
Eosinophils Relative: 2 % (ref 0–5)
HCT: 35.7 % — ABNORMAL LOW (ref 39.0–52.0)
HEMOGLOBIN: 12.6 g/dL — AB (ref 13.0–17.0)
Lymphocytes Relative: 23 % (ref 12–46)
Lymphs Abs: 1.6 10*3/uL (ref 0.7–4.0)
MCH: 33.8 pg (ref 26.0–34.0)
MCHC: 35.3 g/dL (ref 30.0–36.0)
MCV: 95.7 fL (ref 78.0–100.0)
MONOS PCT: 15 % — AB (ref 3–12)
Monocytes Absolute: 1 10*3/uL (ref 0.1–1.0)
NEUTROS ABS: 3.9 10*3/uL (ref 1.7–7.7)
Neutrophils Relative %: 59 % (ref 43–77)
Platelets: 65 10*3/uL — ABNORMAL LOW (ref 150–400)
RBC: 3.73 MIL/uL — ABNORMAL LOW (ref 4.22–5.81)
RDW: 14.8 % (ref 11.5–15.5)
WBC: 6.6 10*3/uL (ref 4.0–10.5)

## 2014-02-11 LAB — I-STAT CG4 LACTIC ACID, ED: LACTIC ACID, VENOUS: 1.54 mmol/L (ref 0.5–2.2)

## 2014-02-11 MED ORDER — CLINDAMYCIN HCL 150 MG PO CAPS: 300.0000 mg | ORAL_CAPSULE | Freq: Three times a day (TID) | ORAL | Status: DC

## 2014-02-11 NOTE — Discharge Instructions (Signed)
Take the prescribed medication as directed.   Follow-up with your primary care physician-- i have called to scheduled follow-up appt for you and the office should call you with time.  If they do not call by tomorrow afternoon, recommend calling to confirm appt time. Return to the ED for new or worsening symptoms-- increased redness, swelling, fevers, chills, etc.

## 2014-02-11 NOTE — ED Notes (Signed)
NOTIFIED DR. Mingo Amber IN PERSON OF

## 2014-02-11 NOTE — ED Notes (Signed)
PT reports seeing his PCP on Thursday both ankles have been swollen for months per Pt . His ankle did not appear to be bruised when at his PCP office on Thursday. Pt reports multiple falls and hitting his ankle . No distress noted ,Pt on cell phone during interview. Pt busy talking to family at bed side and with phone to answer questions

## 2014-02-11 NOTE — ED Notes (Signed)
Pt moved to room 26 to maintain privacy for dopplar.

## 2014-02-11 NOTE — Progress Notes (Signed)
*  Preliminary Results* Left lower extremity venous duplex completed. Left lower extremity is negative for deep vein thrombosis. There is no evidence of left Baker's cyst.  02/11/2014 5:59 PM  Maudry Mayhew, RVT, RDCS, RDMS

## 2014-02-11 NOTE — ED Notes (Signed)
Pt arrives via POV from home with slip and fall on dirt. Pt reports pain in left ankle and calf for the last week. Pt noted to have significant swelling, bruising, limping to room. Denies blood thinners. Brisk capillary refill.

## 2014-02-11 NOTE — ED Provider Notes (Signed)
CSN: [id number]     Arrival date & time 02/11/14  1250 History   First MD Initiated Contact with Patient 02/11/14 1530     Chief Complaint  Patient presents with  . Ankle Pain     (Consider location/radiation/quality/duration/timing/severity/associated sxs/prior Treatment) The history is provided by the patient and medical records.   This is a 68 y.o. M with PMH significant for HTN, hep C, arthritis, presenting to the ED for left ankle and calf pain.  Pt states he was removing some personal items from a truck, slipped in the mud and collapsed onto his left side and twisted his ankle.  No head trauma or LOC.  Pt states he saw his primary care physician last Thursday, but since then sx have worsened.  States ankle is now severely bruised, and his calf is becoming erythematous and warm to touch.  Denies fever or chills.  States he has been ambulatory but with some pain, worse along the anterior calf.  Pt does not use assistive device to walk.  No prior hx of DVT or PE. Patient is not currently on any anticoagulants. Denies chest pain or SOB.  Vital signs stable on arrival.  Past Medical History  Diagnosis Date  . Arthritis   . Hepatitis C   . Hypertension   . Wears glasses   . Wears partial dentures     top partial  . Anemia     low platelets/liver disease   Past Surgical History  Procedure Laterality Date  . Tonsillectomy    . Appendectomy    . Eye surgery      both cataracts  . Pilonidal cyst excision  1960  . Colonoscopy    . Upper gi endoscopy    . Shoulder arthroscopy with subacromial decompression, rotator cuff repair and bicep tendon repair Left 08/09/2013    Procedure: LEFT SHOULDER ARTHROSCOPY WITH SUBACROMIAL DECOMPRESSION, DISTAL CLAVICULECTOMY;  Surgeon: Ninetta Lights, MD;  Location: [facility];  Service: Orthopedics;  Laterality: Left;   History reviewed. No pertinent family history. History  Substance Use Topics  . Smoking status: Light Tobacco  Smoker  . Smokeless tobacco: Not on file  . Alcohol Use: Yes    Review of Systems  Musculoskeletal: Positive for arthralgias and joint swelling.  Skin: Positive for color change.  All other systems reviewed and are negative.     Allergies  Review of patient's allergies indicates no known allergies.  Home Medications   Prior to Admission medications   Medication Sig Start Date End Date Taking? Authorizing Provider  amphetamine-dextroamphetamine (ADDERALL) 10 MG tablet Take 10 mg by mouth Twice daily. 06/10/12   Historical Provider, MD  furosemide (LASIX) 80 MG tablet Take 80 mg by mouth daily. 05/30/12   Historical Provider, MD  ibuprofen (ADVIL,MOTRIN) 200 MG tablet Take 200 mg by mouth every 6 (six) hours as needed for pain.    Historical Provider, MD  Multiple Vitamins-Minerals (MULTIVITAMIN WITH MINERALS) tablet Take 1 tablet by mouth daily.    Historical Provider, MD  oxyCODONE (OXY IR/ROXICODONE) 5 MG immediate release tablet Take 1 tablet (5 mg total) by mouth every 4 (four) hours as needed for pain. 08/09/13   Bernita Buffy, PA-C  spironolactone (ALDACTONE) 25 MG tablet Take 25 mg by mouth Twice daily. 05/23/12   Historical Provider, MD  valACYclovir (VALTREX) 500 MG tablet Take 500 mg by mouth as needed.    Historical Provider, MD  vitamin C (ASCORBIC ACID) 500 MG tablet Take 500  mg by mouth daily.    Historical Provider, MD  zolpidem (AMBIEN) 5 MG tablet Take 5 mg by mouth at bedtime as needed for sleep.    Historical Provider, MD   BP 109/51  Pulse 59  Temp(Src) 98 F (36.7 C) (Oral)  Resp 18  Wt 185 lb (83.915 kg)  SpO2 99%  Physical Exam  Nursing note and vitals reviewed. Constitutional: He is oriented to person, place, and time. He appears well-developed and well-nourished. No distress.  HENT:  Head: Normocephalic and atraumatic.  Mouth/Throat: Oropharynx is clear and moist.  Eyes: Conjunctivae and EOM are normal. Pupils are equal, round, and reactive to  light.  Neck: Normal range of motion. Neck supple.  Cardiovascular: Normal rate, regular rhythm and normal heart sounds.   Pulmonary/Chest: Effort normal and breath sounds normal. No respiratory distress. He has no wheezes.  Musculoskeletal: Normal range of motion.       Left ankle: He exhibits swelling and ecchymosis. He exhibits no deformity and no laceration. Tenderness. Achilles tendon normal.       Left lower leg: He exhibits swelling.  Left ankle with diffusely swollen, erythematous, and bruising along anterior aspect; no gross deformities; full ROM maintained with only mild pain Erythema and pitting edema along left anterior calf; warmth to touch when compared with right; no palpable cords; pulses present with doppler; calf compartment soft; no open lesions or sores  Neurological: He is alert and oriented to person, place, and time.  Skin: Skin is warm and dry. He is not diaphoretic.  Psychiatric: He has a normal mood and affect.    ED Course  Procedures (including critical care time) Labs Review Labs Reviewed  CBC WITH DIFFERENTIAL - Abnormal; Notable for the following:    RBC 3.73 (*)    Hemoglobin 12.6 (*)    HCT 35.7 (*)    Platelets 65 (*)    Monocytes Relative 15 (*)    All other components within normal limits  COMPREHENSIVE METABOLIC PANEL - Abnormal; Notable for the following:    BUN 53 (*)    Creatinine, Ser 1.89 (*)    Albumin 2.8 (*)    Total Bilirubin 2.6 (*)    GFR calc non Af Amer 34 (*)    GFR calc Af Amer 40 (*)    All other components within normal limits  I-STAT CG4 LACTIC ACID, ED    Imaging Review Dg Tibia/fibula Left  02/11/2014   CLINICAL DATA:  Left leg pain following traumatic injury  EXAM: LEFT TIBIA AND FIBULA - 2 VIEW  COMPARISON:  None.  FINDINGS: Soft tissue swelling about the ankle is noted. No acute bony abnormality is seen.  IMPRESSION: Soft tissue swelling without acute bony abnormality.   Electronically Signed   By: Inez Catalina M.D.    On: 02/11/2014 17:06   Dg Ankle Complete Left  02/11/2014   CLINICAL DATA:  Traumatic injury and pain  EXAM: LEFT ANKLE COMPLETE - 3+ VIEW  COMPARISON:  None.  FINDINGS: Considerable soft tissue swelling is noted about the ankle joint. No acute fracture or dislocation is noted.  IMPRESSION: Soft tissue swelling without acute bony abnormality.   Electronically Signed   By: Inez Catalina M.D.   On: 02/11/2014 17:05   *Preliminary Results*  Left lower extremity venous duplex completed.  Left lower extremity is negative for deep vein thrombosis. There is no evidence of left Baker's cyst.  02/11/2014 5:59 PM  Maudry Mayhew, RVT, RDCS, RDMS  EKG Interpretation None      MDM   Final diagnoses:  Cellulitis of left anterior lower leg   68 year old male with large amount of swelling and bruising to left ankle s/p fall last week.  Pt now with onset of erythema and warmth to left anterior shin.  Compartment remains soft without palpable cords.  Concern for cellulitis vs DVT.  Will obtain basic labs, plain films of left anke/tib fib, and LLE venous duplex.  Plain films negative for acute fx.  Duplex negative for DVT.  Labs are reassuring, no leukocytosis.  Patient remains afebrile and overall nontoxic appearing. Area of cellulitis was marked on the patient's left lower extremity.  I have called his primary care office and scheduled a followup appointment for recheck in 1 to 2 days-- Office staff stated they would call with his appointment time.  Patient will be started on clindamycin. Offered pain medication, he declined.  Patient was instructed to monitor for increased redness, swelling, streaking, fever or chills-- instructed to return to the ED if these occur. Discussed plan with patient and sister at bedside, they've acknowledged understanding and agreed with plan of care.  Case was discussed with Dr. Mingo Amber who personally evaluated pt and agreed with plan of care.  Larene Pickett,  PA-C 02/11/14 2308

## 2014-02-14 NOTE — ED Provider Notes (Signed)
Medical screening examination/treatment/procedure(s) were conducted as a shared visit with non-physician practitioner(s) and myself.  I personally evaluated the patient during the encounter.   EKG Interpretation None       Patient here with L ankle swelling. Fell last week. Worsening swelling, erythema of L leg. On exam, compartments soft, pulse obtain with doppler. Neuro status intact.  Labs without white count, otherwise ok. No DVT. Will treat for cellulitis. Has not had trial of outpt antibiotics, given clindamycin here. Has f/u with PCP - arranged by Quincy Carnes, PA-C.    Osvaldo Shipper, MD 02/14/14 [phone]

## 2014-02-17 ENCOUNTER — Encounter (HOSPITAL_COMMUNITY): Payer: Self-pay | Admitting: Emergency Medicine

## 2014-02-17 ENCOUNTER — Inpatient Hospital Stay (HOSPITAL_COMMUNITY)
Admission: EM | Admit: 2014-02-17 | Discharge: 2014-02-19 | DRG: 602 | Disposition: A | Discharge status: Home / Self Care | Payer: BC Managed Care – PPO | Attending: Internal Medicine | Admitting: Internal Medicine

## 2014-02-17 DIAGNOSIS — Z79899 Other long term (current) drug therapy: Secondary | ICD-10-CM

## 2014-02-17 DIAGNOSIS — L03119 Cellulitis of unspecified part of limb: Secondary | ICD-10-CM | POA: Diagnosis present

## 2014-02-17 DIAGNOSIS — Z888 Allergy status to other drugs, medicaments and biological substances status: Secondary | ICD-10-CM | POA: Diagnosis not present

## 2014-02-17 DIAGNOSIS — L0390 Cellulitis, unspecified: Secondary | ICD-10-CM | POA: Diagnosis present

## 2014-02-17 DIAGNOSIS — L0291 Cutaneous abscess, unspecified: Secondary | ICD-10-CM

## 2014-02-17 DIAGNOSIS — F1210 Cannabis abuse, uncomplicated: Secondary | ICD-10-CM | POA: Diagnosis present

## 2014-02-17 DIAGNOSIS — K5900 Constipation, unspecified: Secondary | ICD-10-CM | POA: Diagnosis present

## 2014-02-17 DIAGNOSIS — B1921 Unspecified viral hepatitis C with hepatic coma: Secondary | ICD-10-CM | POA: Diagnosis present

## 2014-02-17 DIAGNOSIS — I10 Essential (primary) hypertension: Secondary | ICD-10-CM | POA: Diagnosis present

## 2014-02-17 DIAGNOSIS — D649 Anemia, unspecified: Secondary | ICD-10-CM | POA: Diagnosis present

## 2014-02-17 DIAGNOSIS — M79609 Pain in unspecified limb: Secondary | ICD-10-CM | POA: Diagnosis present

## 2014-02-17 DIAGNOSIS — F17200 Nicotine dependence, unspecified, uncomplicated: Secondary | ICD-10-CM | POA: Diagnosis present

## 2014-02-17 DIAGNOSIS — K7460 Unspecified cirrhosis of liver: Secondary | ICD-10-CM

## 2014-02-17 DIAGNOSIS — L02419 Cutaneous abscess of limb, unspecified: Secondary | ICD-10-CM | POA: Diagnosis present

## 2014-02-17 DIAGNOSIS — K7682 Hepatic encephalopathy: Secondary | ICD-10-CM

## 2014-02-17 DIAGNOSIS — B1920 Unspecified viral hepatitis C without hepatic coma: Secondary | ICD-10-CM | POA: Clinically undetermined

## 2014-02-17 DIAGNOSIS — D6959 Other secondary thrombocytopenia: Secondary | ICD-10-CM | POA: Diagnosis present

## 2014-02-17 DIAGNOSIS — Z9089 Acquired absence of other organs: Secondary | ICD-10-CM

## 2014-02-17 DIAGNOSIS — L03116 Cellulitis of left lower limb: Secondary | ICD-10-CM

## 2014-02-17 DIAGNOSIS — K7290 Hepatic failure, unspecified without coma: Secondary | ICD-10-CM

## 2014-02-17 HISTORY — DX: Unspecified cirrhosis of liver: K74.60

## 2014-02-17 LAB — COMPREHENSIVE METABOLIC PANEL
ALT: 15 U/L (ref 0–53)
AST: 27 U/L (ref 0–37)
Albumin: 2.4 g/dL — ABNORMAL LOW (ref 3.5–5.2)
Alkaline Phosphatase: 85 U/L (ref 39–117)
BUN: 34 mg/dL — ABNORMAL HIGH (ref 6–23)
CO2: 27 meq/L (ref 19–32)
CREATININE: 1.27 mg/dL (ref 0.50–1.35)
Calcium: 8 mg/dL — ABNORMAL LOW (ref 8.4–10.5)
Chloride: 98 mEq/L (ref 96–112)
GFR, EST AFRICAN AMERICAN: 64 mL/min — AB (ref >90)
GFR, EST NON AFRICAN AMERICAN: 56 mL/min — AB (ref >90)
GLUCOSE: 88 mg/dL (ref 70–99)
Potassium: 4.1 mEq/L (ref 3.7–5.3)
Sodium: 137 mEq/L (ref 137–147)
Total Bilirubin: 1.7 mg/dL — ABNORMAL HIGH (ref 0.3–1.2)
Total Protein: 5.8 g/dL — ABNORMAL LOW (ref 6.0–8.3)

## 2014-02-17 LAB — CBC WITH DIFFERENTIAL/PLATELET
Basophils Absolute: 0 10*3/uL (ref 0.0–0.1)
Basophils Relative: 1 % (ref 0–1)
EOS PCT: 5 % (ref 0–5)
Eosinophils Absolute: 0.3 10*3/uL (ref 0.0–0.7)
HEMATOCRIT: 35.9 % — AB (ref 39.0–52.0)
HEMOGLOBIN: 12.6 g/dL — AB (ref 13.0–17.0)
LYMPHS ABS: 1.4 10*3/uL (ref 0.7–4.0)
LYMPHS PCT: 23 % (ref 12–46)
MCH: 34.2 pg — ABNORMAL HIGH (ref 26.0–34.0)
MCHC: 35.1 g/dL (ref 30.0–36.0)
MCV: 97.6 fL (ref 78.0–100.0)
MONO ABS: 0.8 10*3/uL (ref 0.1–1.0)
MONOS PCT: 14 % — AB (ref 3–12)
NEUTROS ABS: 3.4 10*3/uL (ref 1.7–7.7)
Neutrophils Relative %: 58 % (ref 43–77)
Platelets: 94 10*3/uL — ABNORMAL LOW (ref 150–400)
RBC: 3.68 MIL/uL — AB (ref 4.22–5.81)
RDW: 14.7 % (ref 11.5–15.5)
WBC: 6 10*3/uL (ref 4.0–10.5)

## 2014-02-17 LAB — AMMONIA: Ammonia: 70 umol/L — ABNORMAL HIGH (ref 11–60)

## 2014-02-17 LAB — PROTIME-INR
INR: 1.26 (ref 0.00–1.49)
Prothrombin Time: 15.5 s — ABNORMAL HIGH (ref 11.6–15.2)

## 2014-02-17 MED ORDER — DOCUSATE SODIUM 100 MG PO CAPS: 100.0000 mg | ORAL_CAPSULE | Freq: Two times a day (BID) | ORAL | Status: DC | PRN

## 2014-02-17 MED ORDER — VANCOMYCIN HCL 10 G IV SOLR
1500.0000 mg | INTRAVENOUS | Status: DC
  Administered 2014-02-18: 1500 mg via INTRAVENOUS
  Filled 2014-02-17 (×2): qty 1500

## 2014-02-17 MED ORDER — FUROSEMIDE 40 MG PO TABS
40.0000 mg | ORAL_TABLET | Freq: Two times a day (BID) | ORAL | Status: DC
  Administered 2014-02-17 – 2014-02-19 (×4): 40 mg via ORAL
  Filled 2014-02-17 (×7): qty 1

## 2014-02-17 MED ORDER — CLINDAMYCIN PHOSPHATE 600 MG/50ML IV SOLN: 600.0000 mg | Freq: Once | INTRAVENOUS | Status: DC

## 2014-02-17 MED ORDER — AMPHETAMINE-DEXTROAMPHETAMINE 10 MG PO TABS
10.0000 mg | ORAL_TABLET | Freq: Two times a day (BID) | ORAL | Status: DC
  Administered 2014-02-18 – 2014-02-19 (×3): 10 mg via ORAL
  Filled 2014-02-17 (×3): qty 1

## 2014-02-17 MED ORDER — NADOLOL 40 MG PO TABS
40.0000 mg | ORAL_TABLET | Freq: Every day | ORAL | Status: DC
  Administered 2014-02-17 – 2014-02-18 (×2): 40 mg via ORAL
  Filled 2014-02-17 (×4): qty 1

## 2014-02-17 MED ORDER — SODIUM CHLORIDE 0.9 % IV SOLN
INTRAVENOUS | Status: DC
Start: 2014-02-17 — End: 2014-02-18

## 2014-02-17 MED ORDER — LEDIPASVIR-SOFOSBUVIR 90-400 MG PO TABS
1.0000 | ORAL_TABLET | ORAL | Status: DC
  Administered 2014-02-19: 1 via ORAL

## 2014-02-17 MED ORDER — MORPHINE SULFATE 2 MG/ML IJ SOLN
1.0000 mg | INTRAMUSCULAR | Status: DC | PRN
  Administered 2014-02-17: 1 mg via INTRAVENOUS
  Filled 2014-02-17: qty 1

## 2014-02-17 MED ORDER — SPIRONOLACTONE 25 MG PO TABS
25.0000 mg | ORAL_TABLET | Freq: Two times a day (BID) | ORAL | Status: DC
  Administered 2014-02-17: 25 mg via ORAL
  Filled 2014-02-17 (×4): qty 1

## 2014-02-17 MED ORDER — ADULT MULTIVITAMIN W/MINERALS CH
1.0000 | ORAL_TABLET | Freq: Every day | ORAL | Status: DC
  Administered 2014-02-18 – 2014-02-19 (×2): 1 via ORAL
  Filled 2014-02-17 (×3): qty 1

## 2014-02-17 MED ORDER — FENTANYL CITRATE 0.05 MG/ML IJ SOLN
50.0000 ug | Freq: Once | INTRAMUSCULAR | Status: AC
  Administered 2014-02-17: 50 ug via INTRAVENOUS
  Filled 2014-02-17: qty 2

## 2014-02-17 MED ORDER — IBUPROFEN 200 MG PO TABS: 200.0000 mg | ORAL_TABLET | Freq: Four times a day (QID) | ORAL | Status: DC | PRN

## 2014-02-17 MED ORDER — CLINDAMYCIN PHOSPHATE 600 MG/50ML IV SOLN: 600.0000 mg | Freq: Three times a day (TID) | INTRAVENOUS | Status: DC

## 2014-02-17 MED ORDER — VANCOMYCIN HCL IN DEXTROSE 1-5 GM/200ML-% IV SOLN
1000.0000 mg | Freq: Once | INTRAVENOUS | Status: AC
  Administered 2014-02-17: 1000 mg via INTRAVENOUS
  Filled 2014-02-17: qty 200

## 2014-02-17 MED ORDER — LACTULOSE 10 GM/15ML PO SOLN
20.0000 g | Freq: Once | ORAL | Status: AC
  Administered 2014-02-17: 20 g via ORAL
  Filled 2014-02-17: qty 30

## 2014-02-17 NOTE — ED Provider Notes (Signed)
CSN: [id number]     Arrival date & time 02/17/14  1322 History   First MD Initiated Contact with Patient 02/17/14 1449     Chief Complaint  Patient presents with  . Leg Pain     (Consider location/radiation/quality/duration/timing/severity/associated sxs/prior Treatment) HPI Comments: Patient with history of cirrhosis, prior history of hepatitis C, has been treated for a left lower leg cellulitis for the last week on clindamycin. Patient was initially seen in the emergency department one week ago, also had followup with his primary care physician 3 days ago. According to patient and spouse, the redness and swelling in his left lower leg is improved. However the patient reports an increase in pain around his foot and ankle region. He denies actual systemic fever or chills. He has been compliant with his antibiotics. Patient's spouse also notes that the patient was somewhat confused last week which was also noted by his primary care physician. His mental status seems improved according to spouse, but is still a concern. Patient has not been taking any narcotic pain medicines due to confusion, not wanting to mask any worsening symptoms. Patient was just taking over-the-counter ibuprofen for pain which was handling his pain up until the last 5 days. He denies any chest pain, pleuritic pain, cough. Patient reports that when he was seen in the emergency department one week ago he had a Doppler ultrasound done which showed no DVT.  Patient is a 68 y.o. male presenting with leg pain. The history is provided by the patient, the spouse and medical records.  Leg Pain Associated symptoms: no fever     Past Medical History  Diagnosis Date  . Arthritis   . Hepatitis C   . Hypertension   . Wears glasses   . Wears partial dentures     top partial  . Anemia     low platelets/liver disease   Past Surgical History  Procedure Laterality Date  . Tonsillectomy    . Appendectomy    . Eye surgery      both  cataracts  . Pilonidal cyst excision  1960  . Colonoscopy    . Upper gi endoscopy    . Shoulder arthroscopy with subacromial decompression, rotator cuff repair and bicep tendon repair Left 08/09/2013    Procedure: LEFT SHOULDER ARTHROSCOPY WITH SUBACROMIAL DECOMPRESSION, DISTAL CLAVICULECTOMY;  Surgeon: Ninetta Lights, MD;  Location: [facility];  Service: Orthopedics;  Laterality: Left;   History reviewed. No pertinent family history. History  Substance Use Topics  . Smoking status: Light Tobacco Smoker  . Smokeless tobacco: Not on file  . Alcohol Use: Yes    Review of Systems  Constitutional: Negative for fever and chills.  Respiratory: Negative for cough and shortness of breath.   Cardiovascular: Positive for leg swelling. Negative for chest pain.  Gastrointestinal: Negative for nausea and vomiting.  Musculoskeletal: Positive for arthralgias.  Hematological: Bruises/bleeds easily.  Psychiatric/Behavioral: Positive for confusion.  All other systems reviewed and are negative.     Allergies  Codeine  Home Medications   Prior to Admission medications   Medication Sig Start Date End Date Taking? Authorizing Provider  amphetamine-dextroamphetamine (ADDERALL) 10 MG tablet Take 10 mg by mouth Twice daily. 06/10/12  Yes Historical Provider, MD  clindamycin (CLEOCIN) 150 MG capsule Take 2 capsules (300 mg total) by mouth 3 (three) times daily. May dispense as 150mg  capsules 02/11/14  Yes Larene Pickett, PA-C  Docusate Calcium (STOOL SOFTENER PO) Take 1 tablet by mouth daily.  Yes Historical Provider, MD  furosemide (LASIX) 80 MG tablet Take 40 mg by mouth 2 (two) times daily.  05/30/12  Yes Historical Provider, MD  HARVONI 90-400 MG TABS Take 1 tablet by mouth daily. 01/29/14  Yes Historical Provider, MD  ibuprofen (ADVIL,MOTRIN) 200 MG tablet Take 200 mg by mouth every 6 (six) hours as needed for pain.   Yes Historical Provider, MD  IRON PO Take 1 tablet by mouth daily.    Yes Historical Provider, MD  Multiple Vitamins-Minerals (MULTIVITAMIN WITH MINERALS) tablet Take 1 tablet by mouth daily.   Yes Historical Provider, MD  nadolol (CORGARD) 40 MG tablet Take 40 mg by mouth daily. 01/15/14  Yes Historical Provider, MD  spironolactone (ALDACTONE) 25 MG tablet Take 25 mg by mouth Twice daily. 05/23/12  Yes Historical Provider, MD   BP 106/56  Pulse 68  Temp(Src) 98.2 F (36.8 C) (Oral)  Resp 20  SpO2 99% Physical Exam  Nursing note and vitals reviewed. Constitutional: He appears well-developed and well-nourished. He appears listless. No distress.  Eyes: Conjunctivae and EOM are normal. No scleral icterus.  Neck: Neck supple.  Cardiovascular: Normal rate and regular rhythm.   Pulmonary/Chest: Effort normal. No respiratory distress. He has no wheezes.  Abdominal: Soft. He exhibits no distension. There is no tenderness. There is no rebound.  Musculoskeletal: He exhibits edema and tenderness.       Right lower leg: He exhibits edema. He exhibits no tenderness.       Left lower leg: He exhibits tenderness, swelling and edema. He exhibits no deformity and no laceration.       Legs: Neurological: He appears listless. He exhibits normal muscle tone. Coordination normal. GCS eye subscore is 4. GCS verbal subscore is 4. GCS motor subscore is 6.  Skin: Skin is warm.  Sallow color to skin  Psychiatric: His mood appears not anxious. His affect is not angry and not inappropriate. His speech is delayed and slurred. He is slowed. He does not exhibit a depressed mood. He is inattentive.    ED Course  Procedures (including critical care time) Labs Review Labs Reviewed  CBC WITH DIFFERENTIAL - Abnormal; Notable for the following:    RBC 3.68 (*)    Hemoglobin 12.6 (*)    HCT 35.9 (*)    MCH 34.2 (*)    Platelets 94 (*)    Monocytes Relative 14 (*)    All other components within normal limits  COMPREHENSIVE METABOLIC PANEL - Abnormal; Notable for the following:    BUN  34 (*)    Calcium 8.0 (*)    Total Protein 5.8 (*)    Albumin 2.4 (*)    Total Bilirubin 1.7 (*)    GFR calc non Af Amer 56 (*)    GFR calc Af Amer 64 (*)    All other components within normal limits  AMMONIA - Abnormal; Notable for the following:    Ammonia 70 (*)    All other components within normal limits  PROTIME-INR - Abnormal; Notable for the following:    Prothrombin Time 15.5 (*)    All other components within normal limits    Imaging Review No results found.   EKG Interpretation None     Room air saturation is 99% and I interpret this to be normal.   4:27 PM Pt has no fever, WBC is normal.  Pt had neg DVT study last week.  Area of erythema is receded from ink mark that was drawn at PCP  office 4 days ago.  Seemingly infection is improving with clindamycin.  Labs pending.    4:57 PM Ammonia is elevated at 70.  Will discuss with [facility] hospitalist for admission give likely clinical hepatic encephalopathy and poorly improving cellullits as well as pain.  Will order lactulose.  MDM   Final diagnoses:  Hepatic encephalopathy  Cellulitis of left lower leg     Given worsening pain, must consider worsening infection, new onset DVT, or simply delayed overall pain response considering he was more confused last week and seemingly more alert now.  Will repeat labs, obtain ammonia level, consider IV abx and admission.      Saddie Benders. Monica Codd, MD 02/17/14 1657

## 2014-02-17 NOTE — ED Notes (Signed)
Pt presents to department for evaluation of L lower leg pain and swelling. States he fell x3 weeks ago, hitting L lower leg, now states increased swelling, redness and warmth to extremity. 9/10 pain at the time. Pt is alert and oriented x4.

## 2014-02-17 NOTE — H&P (Signed)
[facility] History and Physical  CAMBELL STANEK SMO:[id number] DOB: [birth year] DOA: 02/17/2014  Referring physician:  PCP: [name], MD  Specialists: Dr. [name], hepatologist [facility]  Chief Complaint: Cellulitis/ Leg pain  HPI: Joshua [name] is a 68 y.o. male  With a history of hepatitis C, history of cirrhosis, and that was recently diagnosed with a left lower leg cellulitis. Patient had seen his primary care doctor approximately a week and half ago and was placed on clindamycin. Patient presents today to the emergency department for increased pain in his left lower extremity. He states his pain is achy in nature and rates as a 7/10. The rash itself has improved according to family however the pain has not. Patient states that his legs are generally swollen due to his hepatitis and cirrhosis. He does state that the swelling was increased in his left lower extremity. Patient also had a lower extremity Doppler on 02/11/2014 which was negative for DVT. At this time patient denies any chest pain, shortness of breath, abdominal pain, nausea, vomiting, diarrhea. He does state he has some constipation. His sister is at bedside, she still a little confused lately. Patient normally follows up with his hepatologist, Dr. [name], at [facility]. He has been taking medication for his hepatitis C.  Review of Systems:  Constitutional: Denies fever, chills, diaphoresis, appetite change and fatigue.  HEENT: Denies photophobia, eye pain, redness, hearing loss, ear pain, congestion, sore throat, rhinorrhea, sneezing, mouth sores, trouble swallowing, neck pain, neck stiffness and tinnitus.   Respiratory: Denies SOB, DOE, cough, chest tightness,  and wheezing.   Cardiovascular: Denies chest pain, palpitations. Gastrointestinal: Complained of constipation. Genitourinary: Denies dysuria, urgency, frequency, hematuria, flank pain and difficulty urinating.  Musculoskeletal: Has pain in her left lower  leg along with cellulitis. Skin: Denies pallor, rash and wound.  Neurological: Denies dizziness, seizures, syncope, weakness, light-headedness, numbness and headaches.  Hematological: Denies adenopathy. Easy bruising, personal or family bleeding history  Psychiatric/Behavioral: Denies suicidal ideation, mood changes, confusion, nervousness, sleep disturbance and agitation  Past Medical History  Diagnosis Date  . Arthritis   . Hepatitis C   . Hypertension   . Wears glasses   . Wears partial dentures     top partial  . Anemia     low platelets/liver disease   Past Surgical History  Procedure Laterality Date  . Tonsillectomy    . Appendectomy    . Eye surgery      both cataracts  . Pilonidal cyst excision  1960  . Colonoscopy    . Upper gi endoscopy    . Shoulder arthroscopy with subacromial decompression, rotator cuff repair and bicep tendon repair Left 08/09/2013    Procedure: LEFT SHOULDER ARTHROSCOPY WITH SUBACROMIAL DECOMPRESSION, DISTAL CLAVICULECTOMY;  Surgeon: Ninetta Lights, MD;  Location: [facility];  Service: Orthopedics;  Laterality: Left;   Social History:  reports that he has been smoking.  He does not have any smokeless tobacco history on file. He reports that he drinks alcohol. He reports that he uses illicit drugs (Marijuana). Lives at home alone.  Allergies  Allergen Reactions  . Codeine Itching    History reviewed. No pertinent family history.  Prior to Admission medications   Medication Sig Start Date End Date Taking? Authorizing Provider  amphetamine-dextroamphetamine (ADDERALL) 10 MG tablet Take 10 mg by mouth Twice daily. 06/10/12  Yes Historical Provider, MD  clindamycin (CLEOCIN) 150 MG capsule Take 2 capsules (300 mg total) by mouth 3 (three) times daily.  May dispense as 150mg  capsules 02/11/14  Yes Larene Pickett, PA-C  Docusate Calcium (STOOL SOFTENER PO) Take 1 tablet by mouth daily.   Yes Historical Provider, MD  furosemide (LASIX)  80 MG tablet Take 40 mg by mouth 2 (two) times daily.  05/30/12  Yes Historical Provider, MD  HARVONI 90-400 MG TABS Take 1 tablet by mouth daily. 01/29/14  Yes Historical Provider, MD  ibuprofen (ADVIL,MOTRIN) 200 MG tablet Take 200 mg by mouth every 6 (six) hours as needed for pain.   Yes Historical Provider, MD  IRON PO Take 1 tablet by mouth daily.   Yes Historical Provider, MD  Multiple Vitamins-Minerals (MULTIVITAMIN WITH MINERALS) tablet Take 1 tablet by mouth daily.   Yes Historical Provider, MD  nadolol (CORGARD) 40 MG tablet Take 40 mg by mouth daily. 01/15/14  Yes Historical Provider, MD  spironolactone (ALDACTONE) 25 MG tablet Take 25 mg by mouth Twice daily. 05/23/12  Yes Historical Provider, MD   Physical Exam: Filed Vitals:   02/17/14 1339  BP: 106/56  Pulse: 68  Temp: 98.2 F (36.8 C)  Resp: 20     General: Well developed, well nourished, NAD, appears stated age  HEENT: NCAT, PERRLA, EOMI, Anicteic Sclera, mucous membranes moist.   Neck: Supple, no JVD, no masses  Cardiovascular: S1 S2 auscultated, no rubs, murmurs or gallops. Regular rate and rhythm.  Respiratory: Clear to auscultation bilaterally with equal chest rise  Abdomen: Soft, obese, nontender, nondistended, + bowel sounds  Extremities: warm dry without cyanosis clubbing.  2+ pitting edema in the lower extremities bilaterally, left lower extremity shows erythema and is warm to touch. Outline of cellulitis on the left lower extremity.  Neuro: AAOx3, cranial nerves grossly intact. Strength 5/5 in patient's upper and lower extremities bilaterally  Skin: Without rashes exudates or nodules  Psych: Normal affect and demeanor  Labs on Admission:  Basic Metabolic Panel:  Recent Labs Lab 02/11/14 1547 02/17/14 1350  NA 137 137  K 4.5 4.1  CL 101 98  CO2 23 27  GLUCOSE 94 88  BUN 53* 34*  CREATININE 1.89* 1.27  CALCIUM 8.6 8.0*   Liver Function Tests:  Recent Labs Lab 02/11/14 1547 02/17/14 1350    AST 31 27  ALT 17 15  ALKPHOS 80 85  BILITOT 2.6* 1.7*  PROT 6.0 5.8*  ALBUMIN 2.8* 2.4*   No results found for this basename: LIPASE, AMYLASE,  in the last 168 hours  Recent Labs Lab 02/17/14 1520  AMMONIA 70*   CBC:  Recent Labs Lab 02/11/14 1542 02/17/14 1350  WBC 6.6 6.0  NEUTROABS 3.9 3.4  HGB 12.6* 12.6*  HCT 35.7* 35.9*  MCV 95.7 97.6  PLT 65* 94*   Cardiac Enzymes: No results found for this basename: CKTOTAL, CKMB, CKMBINDEX, TROPONINI,  in the last 168 hours  BNP (last 3 results) No results found for this basename: PROBNP,  in the last 8760 hours CBG: No results found for this basename: GLUCAP,  in the last 168 hours  Radiological Exams on Admission: No results found.  EKG: none  Assessment/Plan  Left lower extremity cellulitis -Patient will be admitted to medical floor  -And currently afebrile no leukocytosis -Patient was on clindamycin as an outpatient however slow to resolve -Placed on vancomycin with pharmacy to dose -Will give pain control -Patient lower venous Doppler of the left extremity on 02/11/2014, which did not show evidence of a DVT or Baker's cyst.  Elevated ammonia level/hepatic encephalopathy -Patient's sister, patient has  been confused however currently oriented -Patient has never been formally diagnosed with hepatic encephalopathy -Ammonia level of 70, no prior ammonia level for comparison -Lactulose ordered by the emergency department -Will continue to monitor patient's ammonia level  History of cirrhosis -Continue Lasix, spironolactone, nadolol -Patient follows up with hepatologist at [facility]  History of hepatitis C -Continue Harvoni  ADD -Continue adderall  Constipation -Will hopefully resolve with lactulose, however, will order colace  Thrombocytopenia -Likely secondary to cirrhosis/hep C.   -Will continue to monitor, will avoid DVT prophylaxis with anticoagulants  DVT prophylaxis: Foot pumps  Code  Status: Full  Condition: Guarded  Family Communication: Sisters at bedside. Admission, patients condition and plan of care including tests being ordered have been discussed with the patient and sister who indicate understanding and agree with the plan and Code Status.  Disposition Plan: Admitted for observation   Time spent: 60 minutes  Aadvik Roker D.O. [facility] Pager [phone]  If 7PM-7AM, please contact night-coverage [email or web address] Password [email or web address] 02/17/2014, 5:14 PM

## 2014-02-17 NOTE — ED Notes (Signed)
Pt here for follow up of left leg pain. Was diagnosed with cellulitis of left leg on 02/11/14 and was given Cleocin abx. Pt states leg hurts. Leg swollen, inflamed, and hot to the touch. Pt also has residual pain from falling three weeks ago.

## 2014-02-17 NOTE — Progress Notes (Signed)
ANTIBIOTIC CONSULT NOTE - INITIAL  Pharmacy Consult for Vancomycin Indication: Cellulitis  Allergies  Allergen Reactions  . Codeine Itching    Patient Measurements: Height: 5\' 5"  (165.1 cm) Weight: 184 lb 15.5 oz (83.9 kg) IBW/kg (Calculated) : 61.5 Adjusted Body Weight:   Vital Signs: Temp: 98.3 F (36.8 C) (04/26 1747) Temp src: Oral (04/26 1747) BP: 119/53 mmHg (04/26 1747) Pulse Rate: 65 (04/26 1747) Intake/Output from previous day:   Intake/Output from this shift:    Labs:  Recent Labs  02/17/14 1350  WBC 6.0  HGB 12.6*  PLT 94*  CREATININE 1.27   Estimated Creatinine Clearance: 54 ml/min (by C-G formula based on Cr of 1.27). No results found for this basename: VANCOTROUGH, VANCOPEAK, VANCORANDOM, GENTTROUGH, GENTPEAK, GENTRANDOM, TOBRATROUGH, TOBRAPEAK, TOBRARND, AMIKACINPEAK, AMIKACINTROU, AMIKACIN,  in the last 72 hours   Microbiology: No results found for this or any previous visit (from the past 720 hour(s)).  Medical History: Past Medical History  Diagnosis Date  . Arthritis   . Hepatitis C   . Hypertension   . Wears glasses   . Wears partial dentures     top partial  . Anemia     low platelets/liver disease    Medications:  Scheduled:  . amphetamine-dextroamphetamine  10 mg Oral BID WC  . furosemide  40 mg Oral BID  . Ledipasvir-Sofosbuvir  1 tablet Oral Daily  . [START ON 02/18/2014] multivitamin with minerals  1 tablet Oral Daily  . nadolol  40 mg Oral Daily  . spironolactone  25 mg Oral BID   Assessment: 68yo male with cellulitis, failed Clinda po as outpatient, to start Vancomycin.  He received Vanc 1000mg  in ED at 1730 today.    Goal of Therapy:  Vancomycin trough level 10-15 mcg/ml  Plan:  1-  Vancomycin 1500mg  IV q24, start at 1200 on 4/27 2-  F/U renal fxn and cx data 3-  Vanc Trough as appropriate  Gracy Bruins, PharmD [facility]

## 2014-02-18 ENCOUNTER — Encounter (HOSPITAL_COMMUNITY): Payer: Self-pay | Admitting: General Practice

## 2014-02-18 DIAGNOSIS — K7682 Hepatic encephalopathy: Secondary | ICD-10-CM

## 2014-02-18 DIAGNOSIS — I10 Essential (primary) hypertension: Secondary | ICD-10-CM

## 2014-02-18 DIAGNOSIS — K7290 Hepatic failure, unspecified without coma: Secondary | ICD-10-CM

## 2014-02-18 LAB — BASIC METABOLIC PANEL
BUN: 28 mg/dL — AB (ref 6–23)
CHLORIDE: 98 meq/L (ref 96–112)
CO2: 27 mEq/L (ref 19–32)
Calcium: 8 mg/dL — ABNORMAL LOW (ref 8.4–10.5)
Creatinine, Ser: 1.27 mg/dL (ref 0.50–1.35)
GFR calc non Af Amer: 56 mL/min — ABNORMAL LOW (ref >90)
GFR, EST AFRICAN AMERICAN: 64 mL/min — AB (ref >90)
Glucose, Bld: 84 mg/dL (ref 70–99)
POTASSIUM: 4.3 meq/L (ref 3.7–5.3)
Sodium: 135 mEq/L — ABNORMAL LOW (ref 137–147)

## 2014-02-18 LAB — CBC
HEMATOCRIT: 33.3 % — AB (ref 39.0–52.0)
HEMOGLOBIN: 11.4 g/dL — AB (ref 13.0–17.0)
MCH: 33.6 pg (ref 26.0–34.0)
MCHC: 34.2 g/dL (ref 30.0–36.0)
MCV: 98.2 fL (ref 78.0–100.0)
Platelets: 80 10*3/uL — ABNORMAL LOW (ref 150–400)
RBC: 3.39 MIL/uL — ABNORMAL LOW (ref 4.22–5.81)
RDW: 14.7 % (ref 11.5–15.5)
WBC: 4.8 10*3/uL (ref 4.0–10.5)

## 2014-02-18 LAB — AMMONIA: Ammonia: 64 umol/L — ABNORMAL HIGH (ref 11–60)

## 2014-02-18 MED ORDER — SPIRONOLACTONE 100 MG PO TABS
100.0000 mg | ORAL_TABLET | Freq: Two times a day (BID) | ORAL | Status: DC
  Administered 2014-02-18 – 2014-02-19 (×3): 100 mg via ORAL
  Filled 2014-02-18 (×6): qty 1

## 2014-02-18 MED ORDER — OXYCODONE HCL 5 MG PO TABS
5.0000 mg | ORAL_TABLET | Freq: Four times a day (QID) | ORAL | Status: DC | PRN
  Administered 2014-02-18 (×2): 5 mg via ORAL
  Filled 2014-02-18 (×2): qty 1

## 2014-02-18 MED ORDER — WHITE PETROLATUM GEL
Status: AC
  Administered 2014-02-18: 0.2
  Filled 2014-02-18: qty 5

## 2014-02-18 NOTE — Progress Notes (Signed)
PROGRESS NOTE  Joshua [name] FXT:[id number] DOB: 1946-09-19 DOA: 02/17/2014 PCP: [name], MD  Assessment/Plan: Left lower extremity cellulitis  -And currently afebrile no leukocytosis  -Patient was on clindamycin as an outpatient however slow to resolve  -Placed on vancomycin with pharmacy to dose  -Will give pain control  -Patient lower venous Doppler of the left extremity on 02/11/2014, which did not show evidence of a DVT or Baker's cyst.  ?d/c in AM  Elevated ammonia level/hepatic encephalopathy  -Patient has never been formally diagnosed with hepatic encephalopathy  -Ammonia level of 70, no prior ammonia level for comparison  -Lactulose ordered by the emergency department  -Will continue to monitor patient's ammonia level   History of cirrhosis  -Continue Lasix, spironolactone, nadolol  -Patient follows up with hepatologist at [facility]   History of hepatitis C  -Continue Harvoni   ADD  -Continue adderall   Constipation  -Will hopefully resolve with lactulose, however, will order colace   Thrombocytopenia  -Likely secondary to cirrhosis/hep C.  -Will continue to monitor, will avoid DVT prophylaxis with anticoagulants      Code Status: full Family Communication: patient Disposition Plan: home 1-2 days depending on response of cellultitis   Consultants:  none  Procedures:  none   HPI/Subjective: Wants to go home  Objective: Filed Vitals:   02/18/14 0506  BP: 112/60  Pulse: 64  Temp: 97.9 F (36.6 C)  Resp: 18    Intake/Output Summary (Last 24 hours) at 02/18/14 0912 Last data filed at 02/18/14 0700  Gross per 24 hour  Intake    120 ml  Output    675 ml  Net   -555 ml   Filed Weights   02/17/14 1747  Weight: 83.9 kg (184 lb 15.5 oz)    Exam:  General: Well developed, well nourished, NAD, appears stated age  Cardiovascular: S1 S2 auscultated, no rubs, murmurs or gallops. Regular rate and rhythm.  Respiratory: Clear  to auscultation bilaterally with equal chest rise  Abdomen: Soft, obese, nontender, nondistended, + bowel sounds  Extremities: warm dry without cyanosis clubbing. 2+ pitting edema in the lower extremities bilaterally, left lower extremity much improved per patient- limited redness- chronic skin changes. Outline of cellulitis on the left lower extremity.   Data Reviewed: Basic Metabolic Panel:  Recent Labs Lab 02/11/14 1547 02/17/14 1350 02/18/14 0503  NA 137 137 135*  K 4.5 4.1 4.3  CL 101 98 98  CO2 23 27 27   GLUCOSE 94 88 84  BUN 53* 34* 28*  CREATININE 1.89* 1.27 1.27  CALCIUM 8.6 8.0* 8.0*   Liver Function Tests:  Recent Labs Lab 02/11/14 1547 02/17/14 1350  AST 31 27  ALT 17 15  ALKPHOS 80 85  BILITOT 2.6* 1.7*  PROT 6.0 5.8*  ALBUMIN 2.8* 2.4*   No results found for this basename: LIPASE, AMYLASE,  in the last 168 hours  Recent Labs Lab 02/17/14 1520 02/18/14 0503  AMMONIA 70* 64*   CBC:  Recent Labs Lab 02/11/14 1542 02/17/14 1350 02/18/14 0503  WBC 6.6 6.0 4.8  NEUTROABS 3.9 3.4  --   HGB 12.6* 12.6* 11.4*  HCT 35.7* 35.9* 33.3*  MCV 95.7 97.6 98.2  PLT 65* 94* 80*   Cardiac Enzymes: No results found for this basename: CKTOTAL, CKMB, CKMBINDEX, TROPONINI,  in the last 168 hours BNP (last 3 results) No results found for this basename: PROBNP,  in the last 8760 hours CBG: No results found for this basename: GLUCAP,  in the last 168 hours  No results found for this or any previous visit (from the past 240 hour(s)).   Studies: No results found.  Scheduled Meds: . amphetamine-dextroamphetamine  10 mg Oral BID WC  . furosemide  40 mg Oral BID  . Ledipasvir-Sofosbuvir  1 tablet Oral Q24H  . multivitamin with minerals  1 tablet Oral Daily  . nadolol  40 mg Oral Daily  . spironolactone  25 mg Oral BID  . vancomycin  1,500 mg Intravenous Q24H   Continuous Infusions: . sodium chloride 50 mL/hr at 02/17/14 1831   Antibiotics Given (last 72  hours)   None      Active Problems:   Cellulitis   Cirrhosis   Hepatitis C   HTN (hypertension)    Time spent: 35 min    Geradine Girt  [facility] Pager [phone] If 7PM-7AM, please contact night-coverage at [email or web address], password [facility] 02/18/2014, 9:12 AM  LOS: 1 day

## 2014-02-19 MED ORDER — OXYCODONE HCL 5 MG PO TABS: 5.0000 mg | ORAL_TABLET | Freq: Four times a day (QID) | ORAL | Status: DC | PRN

## 2014-02-19 MED ORDER — AMOXICILLIN-POT CLAVULANATE 875-125 MG PO TABS: 1.0000 | ORAL_TABLET | Freq: Two times a day (BID) | ORAL | Status: DC

## 2014-02-19 NOTE — Discharge Summary (Signed)
Physician Discharge Summary  Joshua [name] ZDG:[id number] DOB: [birth year] DOA: 02/17/2014  PCP: [name], MD  Admit date: 02/17/2014 Discharge date: 02/19/2014  Time spent: 35 minutes  Recommendations for Outpatient Follow-up:  1. Periodic CBC, BMP  Discharge Diagnoses:  Active Problems:   Cellulitis   Cirrhosis   Hepatitis C   HTN (hypertension)   Discharge Condition: improved  Diet recommendation: hepatic  Filed Weights   02/17/14 1747  Weight: 83.9 kg (184 lb 15.5 oz)    History of present illness:  HPI: Joshua [name] is a 68 y.o. male  With a history of hepatitis C, history of cirrhosis, and that was recently diagnosed with a left lower leg cellulitis. Patient had seen his primary care doctor approximately a week and half ago and was placed on clindamycin. Patient presents today to the emergency department for increased pain in his left lower extremity. He states his pain is achy in nature and rates as a 7/10. The rash itself has improved according to family however the pain has not. Patient states that his legs are generally swollen due to his hepatitis and cirrhosis. He does state that the swelling was increased in his left lower extremity. Patient also had a lower extremity Doppler on 02/11/2014 which was negative for DVT. At this time patient denies any chest pain, shortness of breath, abdominal pain, nausea, vomiting, diarrhea. He does state he has some constipation. His sister is at bedside, she still a little confused lately. Patient normally follows up with his hepatologist, Dr. [name], at [facility]. He has been taking medication for his hepatitis C.   Hospital Course:  Left lower extremity cellulitis  -And currently afebrile no leukocytosis  -Patient was on clindamycin as an outpatient however slow to resolve  -Placed on vancomycin with pharmacy to dose  -Patient lower venous Doppler of the left extremity on 02/11/2014, which did not show evidence of  a DVT or Baker's cyst.  -leg much improved in redness although swollen- patient instructed to elevate -given vanc x 2 and will be d/c'd on augmentin  Elevated ammonia level/hepatic encephalopathy  -Patient has never been formally diagnosed with hepatic encephalopathy  -Ammonia level of 70, no prior ammonia level for comparison  -Lactulose ordered by the emergency department  -defer to specialist at [facility]  History of cirrhosis  -Continue Lasix, spironolactone, nadolol  -Patient follows up with hepatologist at [facility]   History of hepatitis C  -Continue Harvoni   ADD  -Continue adderall   Constipation  -PRNs  Thrombocytopenia  -Likely secondary to cirrhosis/hep C.  -Will continue to monitor, will avoid DVT prophylaxis with anticoagulants    Procedures:  none  Consultations:  none  Discharge Exam: Filed Vitals:   02/19/14 0537  BP: 93/40  Pulse: 57  Temp: 97.9 F (36.6 C)  Resp: 20    General: anxious to get home as he says we are not prescribing his medications correctly Cardiovascular: rrr Respiratory: clear  Discharge Instructions You were cared for by a hospitalist during your hospital stay. If you have any questions about your discharge medications or the care you received while you were in the hospital after you are discharged, you can call the unit and asked to speak with the hospitalist on call if the hospitalist that took care of you is not available. Once you are discharged, your primary care physician will handle any further medical issues. Please note that NO REFILLS for any discharge medications will be authorized once you  are discharged, as it is imperative that you return to your primary care physician (or establish a relationship with a primary care physician if you do not have one) for your aftercare needs so that they can reassess your need for medications and monitor your lab values.  Discharge Orders   Future Orders Complete By Expires    Discharge instructions  As directed    Increase activity slowly  As directed        Medication List    STOP taking these medications       clindamycin 150 MG capsule  Commonly known as:  CLEOCIN      TAKE these medications       amoxicillin-clavulanate 875-125 MG per tablet  Commonly known as:  AUGMENTIN  Take 1 tablet by mouth 2 (two) times daily.     amphetamine-dextroamphetamine 10 MG tablet  Commonly known as:  ADDERALL  Take 10 mg by mouth Twice daily.     furosemide 80 MG tablet  Commonly known as:  LASIX  Take 40 mg by mouth 2 (two) times daily.     HARVONI 90-400 MG Tabs  Generic drug:  Ledipasvir-Sofosbuvir  Take 1 tablet by mouth daily.     ibuprofen 200 MG tablet  Commonly known as:  ADVIL,MOTRIN  Take 200 mg by mouth every 6 (six) hours as needed for pain.     IRON PO  Take 1 tablet by mouth daily.     multivitamin with minerals tablet  Take 1 tablet by mouth daily.     nadolol 40 MG tablet  Commonly known as:  CORGARD  Take 40 mg by mouth daily.     oxyCODONE 5 MG immediate release tablet  Commonly known as:  Oxy IR/ROXICODONE  Take 1 tablet (5 mg total) by mouth every 6 (six) hours as needed for severe pain.     spironolactone 25 MG tablet  Commonly known as:  ALDACTONE  Take 100 mg by mouth 2 (two) times daily.     STOOL SOFTENER PO  Take 1 tablet by mouth daily.       Allergies  Allergen Reactions  . Codeine Itching       Follow-up Information   Follow up with [facility], MD In 1 week.   Specialty:  Internal Medicine   Contact information:   [address] [place] [address] [phone]        The results of significant diagnostics from this hospitalization (including imaging, microbiology, ancillary and laboratory) are listed below for reference.    Significant Diagnostic Studies: Dg Tibia/fibula Left  02/11/2014   CLINICAL DATA:  Left leg pain following traumatic injury  EXAM: LEFT TIBIA AND  FIBULA - 2 VIEW  COMPARISON:  None.  FINDINGS: Soft tissue swelling about the ankle is noted. No acute bony abnormality is seen.  IMPRESSION: Soft tissue swelling without acute bony abnormality.   Electronically Signed   By: Inez Catalina M.D.   On: 02/11/2014 17:06   Dg Ankle Complete Left  02/11/2014   CLINICAL DATA:  Traumatic injury and pain  EXAM: LEFT ANKLE COMPLETE - 3+ VIEW  COMPARISON:  None.  FINDINGS: Considerable soft tissue swelling is noted about the ankle joint. No acute fracture or dislocation is noted.  IMPRESSION: Soft tissue swelling without acute bony abnormality.   Electronically Signed   By: Inez Catalina M.D.   On: 02/11/2014 17:05    Microbiology: No results found for this or any previous visit (from  the past 240 hour(s)).   Labs: Basic Metabolic Panel:  Recent Labs Lab 02/17/14 1350 02/18/14 0503  NA 137 135*  K 4.1 4.3  CL 98 98  CO2 27 27  GLUCOSE 88 84  BUN 34* 28*  CREATININE 1.27 1.27  CALCIUM 8.0* 8.0*   Liver Function Tests:  Recent Labs Lab 02/17/14 1350  AST 27  ALT 15  ALKPHOS 85  BILITOT 1.7*  PROT 5.8*  ALBUMIN 2.4*   No results found for this basename: LIPASE, AMYLASE,  in the last 168 hours  Recent Labs Lab 02/17/14 1520 02/18/14 0503  AMMONIA 70* 64*   CBC:  Recent Labs Lab 02/17/14 1350 02/18/14 0503  WBC 6.0 4.8  NEUTROABS 3.4  --   HGB 12.6* 11.4*  HCT 35.9* 33.3*  MCV 97.6 98.2  PLT 94* 80*   Cardiac Enzymes: No results found for this basename: CKTOTAL, CKMB, CKMBINDEX, TROPONINI,  in the last 168 hours BNP: BNP (last 3 results) No results found for this basename: PROBNP,  in the last 8760 hours CBG: No results found for this basename: GLUCAP,  in the last 168 hours     Signed:  Geradine Girt  [facility] 02/19/2014, 9:28 AM

## 2014-02-19 NOTE — Progress Notes (Signed)
Discharge instructions given along with prescriptions for Augmentin and Oxycodone. Patient discharged to home. No questions verbalized.

## 2014-04-26 ENCOUNTER — Emergency Department (HOSPITAL_COMMUNITY)
Admission: EM | Admit: 2014-04-26 | Discharge: 2014-04-26 | Disposition: A | Discharge status: Home / Self Care | Payer: BC Managed Care – PPO | Attending: Emergency Medicine | Admitting: Emergency Medicine

## 2014-04-26 ENCOUNTER — Encounter (HOSPITAL_COMMUNITY): Payer: Self-pay | Admitting: Emergency Medicine

## 2014-04-26 DIAGNOSIS — X58XXXA Exposure to other specified factors, initial encounter: Secondary | ICD-10-CM | POA: Insufficient documentation

## 2014-04-26 DIAGNOSIS — IMO0002 Reserved for concepts with insufficient information to code with codable children: Secondary | ICD-10-CM | POA: Insufficient documentation

## 2014-04-26 DIAGNOSIS — M129 Arthropathy, unspecified: Secondary | ICD-10-CM | POA: Insufficient documentation

## 2014-04-26 DIAGNOSIS — I10 Essential (primary) hypertension: Secondary | ICD-10-CM | POA: Insufficient documentation

## 2014-04-26 DIAGNOSIS — Z8719 Personal history of other diseases of the digestive system: Secondary | ICD-10-CM | POA: Insufficient documentation

## 2014-04-26 DIAGNOSIS — Z862 Personal history of diseases of the blood and blood-forming organs and certain disorders involving the immune mechanism: Secondary | ICD-10-CM | POA: Insufficient documentation

## 2014-04-26 DIAGNOSIS — Z98811 Dental restoration status: Secondary | ICD-10-CM | POA: Insufficient documentation

## 2014-04-26 DIAGNOSIS — Y929 Unspecified place or not applicable: Secondary | ICD-10-CM | POA: Insufficient documentation

## 2014-04-26 DIAGNOSIS — F17200 Nicotine dependence, unspecified, uncomplicated: Secondary | ICD-10-CM | POA: Insufficient documentation

## 2014-04-26 DIAGNOSIS — Y939 Activity, unspecified: Secondary | ICD-10-CM | POA: Insufficient documentation

## 2014-04-26 DIAGNOSIS — Z79899 Other long term (current) drug therapy: Secondary | ICD-10-CM | POA: Insufficient documentation

## 2014-04-26 DIAGNOSIS — S86912A Strain of unspecified muscle(s) and tendon(s) at lower leg level, left leg, initial encounter: Secondary | ICD-10-CM

## 2014-04-26 DIAGNOSIS — Z8619 Personal history of other infectious and parasitic diseases: Secondary | ICD-10-CM | POA: Insufficient documentation

## 2014-04-26 MED ORDER — TRAMADOL HCL 50 MG PO TABS
50.0000 mg | ORAL_TABLET | Freq: Once | ORAL | Status: AC
  Administered 2014-04-26: 50 mg via ORAL
  Filled 2014-04-26: qty 1

## 2014-04-26 MED ORDER — TRAMADOL HCL 50 MG PO TABS: 50.0000 mg | ORAL_TABLET | Freq: Four times a day (QID) | ORAL | Status: DC | PRN

## 2014-04-26 MED ORDER — NAPROXEN 375 MG PO TABS: 375.0000 mg | ORAL_TABLET | Freq: Two times a day (BID) | ORAL | Status: DC

## 2014-04-26 MED ORDER — IBUPROFEN 800 MG PO TABS
800.0000 mg | ORAL_TABLET | Freq: Once | ORAL | Status: AC
  Administered 2014-04-26: 800 mg via ORAL
  Filled 2014-04-26: qty 1

## 2014-04-26 NOTE — ED Notes (Signed)
Pt states he is been working on his fends for long days and suddenly he started feeling pain on his left knee that he refers is constant 9/10 on pain scale, pt denies any fall or injury to his knee.

## 2014-04-26 NOTE — ED Provider Notes (Signed)
CSN: [id number]     Arrival date & time 04/26/14  7829 History   First MD Initiated Contact with Patient 04/26/14 [phone]     Chief Complaint  Patient presents with  . Knee Pain     (Consider location/radiation/quality/duration/timing/severity/associated sxs/prior Treatment) HPI  This patient is a very pleasant 68 year old man history of osteoarthritis. He presents with complaints of left-sided knee pain. He notes that he has been walking up and down his and unevenly graded yard painting sealant onto a fence for the past several days.   Pain is aching and localizes to the to the medial and lateral aspects of the joint. The patient recalls no specific trauma or strain. He has not appreciated joint effusion or swelling. Pain is aching, worse when the patient begins to walk and improves as the patient walks around more. Pt denies paresthesias and motor weakness. Rate his pain as 8/10 at worst and 4/10 at present.   Past Medical History  Diagnosis Date  . Arthritis   . Hepatitis C   . Hypertension   . Wears glasses   . Wears partial dentures     top partial  . Anemia     low platelets/liver disease  . Cirrhosis    Past Surgical History  Procedure Laterality Date  . Tonsillectomy    . Appendectomy    . Eye surgery      both cataracts  . Pilonidal cyst excision  1960  . Colonoscopy    . Upper gi endoscopy    . Shoulder arthroscopy with subacromial decompression, rotator cuff repair and bicep tendon repair Left 08/09/2013    Procedure: LEFT SHOULDER ARTHROSCOPY WITH SUBACROMIAL DECOMPRESSION, DISTAL CLAVICULECTOMY;  Surgeon: Ninetta Lights, MD;  Location: [facility];  Service: Orthopedics;  Laterality: Left;   History reviewed. No pertinent family history. History  Substance Use Topics  . Smoking status: Light Tobacco Smoker  . Smokeless tobacco: Current User  . Alcohol Use: Yes    Review of Systems  Ten point review of symptoms performed and is negative with  the exception of symptoms noted above.   Allergies  Codeine  Home Medications   Prior to Admission medications   Medication Sig Start Date End Date Taking? Authorizing Kianni Lheureux  amoxicillin-clavulanate (AUGMENTIN) 875-125 MG per tablet Take 1 tablet by mouth 2 (two) times daily. 02/19/14   Geradine Girt, DO  amphetamine-dextroamphetamine (ADDERALL) 10 MG tablet Take 10 mg by mouth Twice daily. 06/10/12   Historical Javaris Wigington, MD  Docusate Calcium (STOOL SOFTENER PO) Take 1 tablet by mouth daily.    Historical Maronda Caison, MD  furosemide (LASIX) 80 MG tablet Take 40 mg by mouth 2 (two) times daily.  05/30/12   Historical Rudolf Blizard, MD  HARVONI 90-400 MG TABS Take 1 tablet by mouth daily. 01/29/14   Historical Dashonda Bonneau, MD  ibuprofen (ADVIL,MOTRIN) 200 MG tablet Take 200 mg by mouth every 6 (six) hours as needed for pain.    Historical Shontelle Muska, MD  IRON PO Take 1 tablet by mouth daily.    Historical Arden Axon, MD  Multiple Vitamins-Minerals (MULTIVITAMIN WITH MINERALS) tablet Take 1 tablet by mouth daily.    Historical Hondo Nanda, MD  nadolol (CORGARD) 40 MG tablet Take 40 mg by mouth daily. 01/15/14   Historical Zakeya Junker, MD  naproxen (NAPROSYN) 375 MG tablet Take 1 tablet (375 mg total) by mouth 2 (two) times daily. 04/26/14   Elyn Peers, MD  oxyCODONE (OXY IR/ROXICODONE) 5 MG immediate release tablet Take 1 tablet (5  mg total) by mouth every 6 (six) hours as needed for severe pain. 02/19/14   Geradine Girt, DO  spironolactone (ALDACTONE) 25 MG tablet Take 100 mg by mouth 2 (two) times daily.    Historical Fynley Chrystal, MD  traMADol (ULTRAM) 50 MG tablet Take 1 tablet (50 mg total) by mouth every 6 (six) hours as needed. 04/26/14   Elyn Peers, MD   BP 121/59  Pulse 57  Temp(Src) 97.5 F (36.4 C) (Oral)  Resp 16  Ht 5\' 6"  (1.676 m)  Wt 190 lb (86.183 kg)  BMI 30.68 kg/m2  SpO2 100% Physical Exam Gen: well developed and well nourished appearing Head: NCAT Eyes: PERL, EOMI Nose: no epistaixis or  rhinorrhea Mouth/throat: mucosa is moist and pink Neck: supple, no stridor Lungs: CTA B, no wheezing, rhonchi or rales CV: RRR, no murmur, extremities appear well perfused.  Abd: soft, notender, nondistended Back: normal to inspection Skin: warm and dry Ext: LLE: normal to inspection, mild ttp over the medial and lateral aspect of the left knee, able to range without pain, no joint effusion, distal neurovascular exam normal. no dependent edema Neuro: CN ii-xii grossly intact, no focal deficits Psyche; normal affect,  calm and cooperative.   ED Course  Procedures (including critical care time) Labs Review   MDM   Final diagnoses:  Knee strain, left, initial encounter   We will manage supportively with knee immobilizer to use on a prn basis and prescribe Tramadol and 10d course of Naproxen. The patient says he has an orthopedist with whom he will follow up.     Elyn Peers, MD 04/26/14 [phone]

## 2014-05-21 ENCOUNTER — Encounter (HOSPITAL_COMMUNITY): Payer: Self-pay | Admitting: Emergency Medicine

## 2014-05-21 DIAGNOSIS — F17200 Nicotine dependence, unspecified, uncomplicated: Secondary | ICD-10-CM | POA: Diagnosis present

## 2014-05-21 DIAGNOSIS — N183 Chronic kidney disease, stage 3 unspecified: Secondary | ICD-10-CM | POA: Diagnosis present

## 2014-05-21 DIAGNOSIS — G319 Degenerative disease of nervous system, unspecified: Secondary | ICD-10-CM | POA: Diagnosis present

## 2014-05-21 DIAGNOSIS — R159 Full incontinence of feces: Secondary | ICD-10-CM | POA: Diagnosis present

## 2014-05-21 DIAGNOSIS — D6959 Other secondary thrombocytopenia: Secondary | ICD-10-CM | POA: Diagnosis present

## 2014-05-21 DIAGNOSIS — M129 Arthropathy, unspecified: Secondary | ICD-10-CM | POA: Diagnosis present

## 2014-05-21 DIAGNOSIS — K7460 Unspecified cirrhosis of liver: Secondary | ICD-10-CM | POA: Diagnosis present

## 2014-05-21 DIAGNOSIS — B1920 Unspecified viral hepatitis C without hepatic coma: Secondary | ICD-10-CM | POA: Diagnosis present

## 2014-05-21 DIAGNOSIS — K7290 Hepatic failure, unspecified without coma: Principal | ICD-10-CM | POA: Diagnosis present

## 2014-05-21 DIAGNOSIS — N189 Chronic kidney disease, unspecified: Secondary | ICD-10-CM | POA: Diagnosis present

## 2014-05-21 DIAGNOSIS — E860 Dehydration: Secondary | ICD-10-CM | POA: Diagnosis present

## 2014-05-21 DIAGNOSIS — I10 Essential (primary) hypertension: Secondary | ICD-10-CM | POA: Diagnosis present

## 2014-05-21 DIAGNOSIS — K7682 Hepatic encephalopathy: Principal | ICD-10-CM | POA: Diagnosis present

## 2014-05-21 DIAGNOSIS — I129 Hypertensive chronic kidney disease with stage 1 through stage 4 chronic kidney disease, or unspecified chronic kidney disease: Secondary | ICD-10-CM | POA: Diagnosis present

## 2014-05-21 DIAGNOSIS — D689 Coagulation defect, unspecified: Secondary | ICD-10-CM | POA: Diagnosis present

## 2014-05-21 LAB — CBC WITH DIFFERENTIAL/PLATELET
BASOS ABS: 0 10*3/uL (ref 0.0–0.1)
Basophils Relative: 1 % (ref 0–1)
EOS PCT: 3 % (ref 0–5)
Eosinophils Absolute: 0.2 10*3/uL (ref 0.0–0.7)
HEMATOCRIT: 43.2 % (ref 39.0–52.0)
Hemoglobin: 15.5 g/dL (ref 13.0–17.0)
LYMPHS ABS: 2.7 10*3/uL (ref 0.7–4.0)
Lymphocytes Relative: 31 % (ref 12–46)
MCH: 32.8 pg (ref 26.0–34.0)
MCHC: 35.9 g/dL (ref 30.0–36.0)
MCV: 91.5 fL (ref 78.0–100.0)
Monocytes Absolute: 0.6 10*3/uL (ref 0.1–1.0)
Monocytes Relative: 7 % (ref 3–12)
Neutro Abs: 5.3 10*3/uL (ref 1.7–7.7)
Neutrophils Relative %: 59 % (ref 43–77)
Platelets: 91 10*3/uL — ABNORMAL LOW (ref 150–400)
RBC: 4.72 MIL/uL (ref 4.22–5.81)
RDW: 14.1 % (ref 11.5–15.5)
WBC: 8.9 10*3/uL (ref 4.0–10.5)

## 2014-05-21 LAB — COMPREHENSIVE METABOLIC PANEL
ALT: 22 U/L (ref 0–53)
ANION GAP: 14 (ref 5–15)
AST: 37 U/L (ref 0–37)
Albumin: 3.4 g/dL — ABNORMAL LOW (ref 3.5–5.2)
Alkaline Phosphatase: 86 U/L (ref 39–117)
BILIRUBIN TOTAL: 2.4 mg/dL — AB (ref 0.3–1.2)
BUN: 34 mg/dL — AB (ref 6–23)
CALCIUM: 9.1 mg/dL (ref 8.4–10.5)
CO2: 23 meq/L (ref 19–32)
Chloride: 95 mEq/L — ABNORMAL LOW (ref 96–112)
Creatinine, Ser: 1.57 mg/dL — ABNORMAL HIGH (ref 0.50–1.35)
GFR, EST AFRICAN AMERICAN: 50 mL/min — AB (ref >90)
GFR, EST NON AFRICAN AMERICAN: 43 mL/min — AB (ref >90)
GLUCOSE: 90 mg/dL (ref 70–99)
Potassium: 4.9 mEq/L (ref 3.7–5.3)
Sodium: 132 mEq/L — ABNORMAL LOW (ref 137–147)
Total Protein: 6.5 g/dL (ref 6.0–8.3)

## 2014-05-21 LAB — AMMONIA: AMMONIA: 100 umol/L — AB (ref 11–60)

## 2014-05-21 NOTE — ED Notes (Signed)
Friend reported that pt. has been confused and disoriented for the past several days , pt. presents with confusion / disoriented , speech clear with no facial asymmetry , equal grips / no arm drift , ambulatory. Denies pain / respirations unlabored. His friend reported that he has a history of hepatitis C / elevated ammonia .

## 2014-05-22 ENCOUNTER — Encounter (HOSPITAL_COMMUNITY): Payer: Self-pay | Admitting: Emergency Medicine

## 2014-05-22 ENCOUNTER — Inpatient Hospital Stay (HOSPITAL_COMMUNITY): Payer: BC Managed Care – PPO

## 2014-05-22 ENCOUNTER — Emergency Department (HOSPITAL_COMMUNITY): Payer: BC Managed Care – PPO

## 2014-05-22 ENCOUNTER — Inpatient Hospital Stay (HOSPITAL_COMMUNITY)
Admission: EM | Admit: 2014-05-22 | Discharge: 2014-05-24 | DRG: 442 | Disposition: A | Discharge status: Home / Self Care | Payer: BC Managed Care – PPO | Attending: Internal Medicine | Admitting: Internal Medicine

## 2014-05-22 DIAGNOSIS — B1920 Unspecified viral hepatitis C without hepatic coma: Secondary | ICD-10-CM

## 2014-05-22 DIAGNOSIS — I10 Essential (primary) hypertension: Secondary | ICD-10-CM | POA: Diagnosis present

## 2014-05-22 DIAGNOSIS — G9389 Other specified disorders of brain: Secondary | ICD-10-CM | POA: Diagnosis not present

## 2014-05-22 DIAGNOSIS — R4182 Altered mental status, unspecified: Secondary | ICD-10-CM

## 2014-05-22 DIAGNOSIS — K7469 Other cirrhosis of liver: Secondary | ICD-10-CM

## 2014-05-22 DIAGNOSIS — E7220 Disorder of urea cycle metabolism, unspecified: Secondary | ICD-10-CM | POA: Insufficient documentation

## 2014-05-22 DIAGNOSIS — K7460 Unspecified cirrhosis of liver: Secondary | ICD-10-CM

## 2014-05-22 DIAGNOSIS — N189 Chronic kidney disease, unspecified: Secondary | ICD-10-CM | POA: Diagnosis present

## 2014-05-22 DIAGNOSIS — G9340 Encephalopathy, unspecified: Secondary | ICD-10-CM | POA: Diagnosis present

## 2014-05-22 DIAGNOSIS — B182 Chronic viral hepatitis C: Secondary | ICD-10-CM

## 2014-05-22 DIAGNOSIS — N179 Acute kidney failure, unspecified: Secondary | ICD-10-CM

## 2014-05-22 LAB — URINALYSIS, ROUTINE W REFLEX MICROSCOPIC
BILIRUBIN URINE: NEGATIVE
GLUCOSE, UA: NEGATIVE mg/dL
Ketones, ur: NEGATIVE mg/dL
Leukocytes, UA: NEGATIVE
Nitrite: NEGATIVE
Protein, ur: NEGATIVE mg/dL
Specific Gravity, Urine: 1.011 (ref 1.005–1.030)
UROBILINOGEN UA: 1 mg/dL (ref 0.0–1.0)
pH: 6 (ref 5.0–8.0)

## 2014-05-22 LAB — PROTIME-INR
INR: 1.38 (ref 0.00–1.49)
INR: 1.58 — ABNORMAL HIGH (ref 0.00–1.49)
Prothrombin Time: 17 seconds — ABNORMAL HIGH (ref 11.6–15.2)
Prothrombin Time: 18.9 seconds — ABNORMAL HIGH (ref 11.6–15.2)

## 2014-05-22 LAB — AMMONIA
AMMONIA: 225 umol/L — AB (ref 11–60)
AMMONIA: 84 umol/L — AB (ref 11–60)

## 2014-05-22 LAB — GLUCOSE, CAPILLARY: Glucose-Capillary: 120 mg/dL — ABNORMAL HIGH (ref 70–99)

## 2014-05-22 LAB — CREATININE, URINE, RANDOM: Creatinine, Urine: 79.69 mg/dL

## 2014-05-22 LAB — URINE MICROSCOPIC-ADD ON

## 2014-05-22 LAB — RAPID URINE DRUG SCREEN, HOSP PERFORMED
Amphetamines: NOT DETECTED
Barbiturates: NOT DETECTED
Benzodiazepines: NOT DETECTED
Cocaine: NOT DETECTED
Opiates: NOT DETECTED
Tetrahydrocannabinol: NOT DETECTED

## 2014-05-22 LAB — SALICYLATE LEVEL: Salicylate Lvl: <2 mg/dL — ABNORMAL LOW (ref 2.8–20.0)

## 2014-05-22 LAB — MRSA PCR SCREENING: MRSA BY PCR: NEGATIVE

## 2014-05-22 LAB — ACETAMINOPHEN LEVEL: Acetaminophen (Tylenol), Serum: <15 ug/mL (ref 10–30)

## 2014-05-22 LAB — ETHANOL

## 2014-05-22 LAB — CBG MONITORING, ED: Glucose-Capillary: 88 mg/dL (ref 70–99)

## 2014-05-22 LAB — SODIUM, URINE, RANDOM

## 2014-05-22 LAB — I-STAT CG4 LACTIC ACID, ED: Lactic Acid, Venous: 1.6 mmol/L (ref 0.5–2.2)

## 2014-05-22 MED ORDER — SODIUM CHLORIDE 0.9 % IJ SOLN
3.0000 mL | Freq: Two times a day (BID) | INTRAMUSCULAR | Status: DC
  Administered 2014-05-22 – 2014-05-24 (×4): 3 mL via INTRAVENOUS

## 2014-05-22 MED ORDER — NADOLOL 40 MG PO TABS
40.0000 mg | ORAL_TABLET | Freq: Every day | ORAL | Status: DC
  Administered 2014-05-22: 40 mg via ORAL
  Filled 2014-05-22 (×2): qty 1

## 2014-05-22 MED ORDER — STROKE: EARLY STAGES OF RECOVERY BOOK
Freq: Once | Status: AC
  Administered 2014-05-22: 1
  Filled 2014-05-22: qty 1

## 2014-05-22 MED ORDER — LEDIPASVIR-SOFOSBUVIR 90-400 MG PO TABS: 1.0000 | ORAL_TABLET | Freq: Every day | ORAL | Status: DC

## 2014-05-22 MED ORDER — AMPHETAMINE-DEXTROAMPHETAMINE 10 MG PO TABS
10.0000 mg | ORAL_TABLET | Freq: Every day | ORAL | Status: DC
  Administered 2014-05-23 – 2014-05-24 (×2): 10 mg via ORAL
  Filled 2014-05-22 (×2): qty 1

## 2014-05-22 MED ORDER — LACTULOSE 10 GM/15ML PO SOLN
30.0000 g | Freq: Three times a day (TID) | ORAL | Status: DC
  Administered 2014-05-22 – 2014-05-24 (×7): 30 g via ORAL
  Filled 2014-05-22 (×9): qty 45

## 2014-05-22 MED ORDER — KETOROLAC TROMETHAMINE 30 MG/ML IJ SOLN
30.0000 mg | Freq: Once | INTRAMUSCULAR | Status: AC
  Administered 2014-05-22: 30 mg via INTRAVENOUS
  Filled 2014-05-22: qty 1

## 2014-05-22 MED ORDER — LACTULOSE 10 GM/15ML PO SOLN
30.0000 g | Freq: Once | ORAL | Status: AC
  Administered 2014-05-22: 30 g via ORAL
  Filled 2014-05-22: qty 45

## 2014-05-22 NOTE — Progress Notes (Signed)
Utilization Review Completed.  

## 2014-05-22 NOTE — ED Notes (Signed)
Dr. Kakrakandy at bedside. 

## 2014-05-22 NOTE — ED Notes (Signed)
Unsuccessful In and out cath attempted by this RN and Alesia Morin, RN. Bladder scanner to be performed next. Dr. Randal Buba notified.

## 2014-05-22 NOTE — ED Provider Notes (Signed)
CSN: [id number]     Arrival date & time 7/Joshua/15  2119 History   None    Chief Complaint  Patient presents with  . Altered Mental Status     (Consider location/radiation/quality/duration/timing/severity/associated sxs/prior Treatment) Patient is a 68 y.o. [name] presenting with altered mental status. The history is provided by medical records. The history is limited by the condition of the patient.  Altered Mental Status Presenting symptoms: confusion   Presenting symptoms: no behavior changes   Severity:  Severe Most recent episode: unable. Episode history:  Unable to specify Timing:  Constant Context comment:  Hep c Associated symptoms: no abdominal pain     Past Medical History  Diagnosis Date  . Arthritis   . Hepatitis C   . Hypertension   . Wears glasses   . Wears partial dentures     top partial  . Anemia     low platelets/liver disease  . Cirrhosis    Past Surgical History  Procedure Laterality Date  . Tonsillectomy    . Appendectomy    . Eye surgery      both cataracts  . Pilonidal cyst excision  1960  . Colonoscopy    . Upper gi endoscopy    . Shoulder arthroscopy with subacromial decompression, rotator cuff repair and bicep tendon repair Left 08/09/2013    Procedure: LEFT SHOULDER ARTHROSCOPY WITH SUBACROMIAL DECOMPRESSION, DISTAL CLAVICULECTOMY;  Surgeon: Ninetta Lights, MD;  Location: [facility];  Service: Orthopedics;  Laterality: Left;   History reviewed. No pertinent family history. History  Substance Use Topics  . Smoking status: Light Tobacco Smoker  . Smokeless tobacco: Current User  . Alcohol Use: Yes    Review of Systems  Unable to perform ROS Gastrointestinal: Negative for abdominal pain.  Psychiatric/Behavioral: Positive for confusion.      Allergies  Codeine  Home Medications   Prior to Admission medications   Medication Sig Start Date End Date Taking? Authorizing Provider  amoxicillin-clavulanate (AUGMENTIN)  875-125 MG per tablet Take 1 tablet by mouth 2 (two) times daily. 4/Joshua/15   Geradine Girt, DO  amphetamine-dextroamphetamine (ADDERALL) 10 MG tablet Take 10 mg by mouth Twice daily. 06/10/12   Historical Provider, MD  Docusate Calcium (STOOL SOFTENER PO) Take 1 tablet by mouth daily.    Historical Provider, MD  furosemide (LASIX) 80 MG tablet Take 40 mg by mouth 2 (two) times daily.  05/30/12   Historical Provider, MD  HARVONI 90-400 MG TABS Take 1 tablet by mouth daily. 01/29/14   Historical Provider, MD  ibuprofen (ADVIL,MOTRIN) 200 MG tablet Take 200 mg by mouth every 6 (six) hours as needed for pain.    Historical Provider, MD  IRON PO Take 1 tablet by mouth daily.    Historical Provider, MD  Multiple Vitamins-Minerals (MULTIVITAMIN WITH MINERALS) tablet Take 1 tablet by mouth daily.    Historical Provider, MD  nadolol (CORGARD) 40 MG tablet Take 40 mg by mouth daily. 01/15/14   Historical Provider, MD  naproxen (NAPROSYN) 375 MG tablet Take 1 tablet (375 mg total) by mouth 2 (two) times daily. 04/26/14   Elyn Peers, MD  oxyCODONE (OXY IR/ROXICODONE) 5 MG immediate release tablet Take 1 tablet (5 mg total) by mouth every 6 (six) hours as needed for severe pain. 4/Joshua/15   Geradine Girt, DO  spironolactone (ALDACTONE) 25 MG tablet Take 100 mg by mouth 2 (two) times daily.    Historical Provider, MD  traMADol (ULTRAM) 50 MG tablet Take 1  tablet (50 mg total) by mouth every 6 (six) hours as needed. 04/26/14   Elyn Peers, MD   BP 134/88  Pulse 73  Temp(Src) 98.5 F (36.9 C) (Oral)  Resp 20  Wt 185 lb 5 oz (84.057 kg)  SpO2 100% Physical Exam  Constitutional: He appears well-developed and well-nourished. No distress.  Fetor hepaticus  HENT:  Head: Normocephalic and atraumatic.  Mouth/Throat: Oropharynx is clear and moist.  Eyes: Conjunctivae and EOM are normal. Pupils are equal, round, and reactive to light.  Neck: Normal range of motion. Neck supple.  Cardiovascular: Normal rate, regular rhythm  and intact distal pulses.   Pulmonary/Chest: Effort normal and breath sounds normal. He has no wheezes. He has no rales.  Abdominal: Soft. Bowel sounds are normal. There is no tenderness. There is no rebound and no guarding.  Musculoskeletal: Normal range of motion.  Neurological: He is alert. He has normal reflexes.  asterixis  Skin: Skin is warm and dry.  Psychiatric: He has a normal mood and affect.    ED Course  Procedures (including critical care time) Labs Review Labs Reviewed  CBC WITH DIFFERENTIAL - Abnormal; Notable for the following:    Platelets 91 (*)    All other components within normal limits  COMPREHENSIVE METABOLIC PANEL - Abnormal; Notable for the following:    Sodium 132 (*)    Chloride 95 (*)    BUN 34 (*)    Creatinine, Ser 1.57 (*)    Albumin 3.4 (*)    Total Bilirubin 2.4 (*)    GFR calc non Af Amer 43 (*)    GFR calc Af Amer 50 (*)    All other components within normal limits  AMMONIA - Abnormal; Notable for the following:    Ammonia 100 (*)    All other components within normal limits  URINALYSIS, ROUTINE W REFLEX MICROSCOPIC  URINE RAPID DRUG SCREEN (HOSP PERFORMED)  ETHANOL  AMMONIA  PROTIME-INR  ACETAMINOPHEN LEVEL  SALICYLATE LEVEL  I-STAT CG4 LACTIC ACID, ED    Imaging Review No results found.   EKG Interpretation None      MDM   Final diagnoses:  None     Date: 05/22/2014  Rate: 71  Rhythm: normal sinus rhythm  QRS Axis: normal  Intervals: normal  ST/T Wave abnormalities: normal  Conduction Disutrbances: none  Narrative Interpretation: unremarkable   Admit, start lactulose       Dakota Stangl K Jen Benedict-Rasch, MD 05/22/14 0100
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
## 2014-05-22 NOTE — ED Notes (Signed)
Attempted report 

## 2014-05-22 NOTE — Progress Notes (Signed)
[facility] TEAM 1 - Stepdown/ICU TEAM Progress Note  Joshua [name] WJX:[id number] DOB: 1945/11/08 DOA: 05/22/2014 PCP: [name], MD  Admit HPI / Brief Narrative: 68 y.o. male with history of hepatitis C and cirrhosis of liver was brought to the ER after patient was found to be increasingly confused as noticed by patient's friend. Patient was confused with slurred speech and had difficulty walking. He also had incontinence of his bowels. He was brought to the ER. Ammonia levels were elevated and CT head showed possible subacute stroke versus old stroke.  As per notes from [facility], where patient follows for his hepatitis C and cirrhosis of liver, patient's diuretics were increased on July 9 due to increasing edema.   HPI/Subjective: Pt is seen for a f/u visit  Assessment/Plan:  Acute hepatic encephalopathy  ?Subacute CVA Ruled out by MRI   Hepatitis C w/ cirrhosis of the liver  Thrombocytopenia   Coagulopathy  Acute on chronic renal failure   Code Status: FULL Family Communication: no family present at time of exam Disposition Plan: SDU  Consultants: none  Procedures: none  Antibiotics: none  DVT prophylaxis: SCDs  Objective: Blood pressure 103/44, pulse 59, temperature 98.2 F (36.8 C), temperature source Oral, resp. rate 12, height 5\' 6"  (1.676 m), weight 81.4 kg (179 lb 7.3 oz), SpO2 97.00%.  Intake/Output Summary (Last 24 hours) at 05/22/14 1303 Last data filed at 05/22/14 1032  Gross per 24 hour  Intake    222 ml  Output   2500 ml  Net  -2278 ml   Exam: F/U exam completed  Data Reviewed: Basic Metabolic Panel:  Recent Labs Lab 05/21/14 2133  NA 132*  K 4.9  CL 95*  CO2 23  GLUCOSE 90  BUN 34*  CREATININE 1.57*  CALCIUM 9.1    Liver Function Tests:  Recent Labs Lab 05/21/14 2133  AST 37  ALT 22  ALKPHOS 86  BILITOT 2.4*  PROT 6.5  ALBUMIN 3.4*   No results found for this basename: LIPASE, AMYLASE,  in the last 168  hours  Recent Labs Lab 05/21/14 2133 05/22/14 0055 05/22/14 0550  AMMONIA 100* 84* 225*    Coags:  Recent Labs Lab 05/22/14 0055 05/22/14 0717  INR 1.38 1.58*   No results found for this basename: PTT,  in the last 168 hours  CBC:  Recent Labs Lab 05/21/14 2133  WBC 8.9  NEUTROABS 5.3  HGB 15.5  HCT 43.2  MCV 91.5  PLT 91*   CBG:  Recent Labs Lab 05/22/14 0804 05/22/14 1213  GLUCAP 88 120*    Recent Results (from the past 240 hour(s))  MRSA PCR SCREENING     Status: None   Collection Time    05/22/14  9:08 AM      Result Value Ref Range Status   MRSA by PCR NEGATIVE  NEGATIVE Final   Comment:            The GeneXpert MRSA Assay (FDA     approved for NASAL specimens     only), is one component of a     comprehensive MRSA colonization     surveillance program. It is not     intended to diagnose MRSA     infection nor to guide or     monitor treatment for     MRSA infections.     Studies:  Recent x-ray studies have been reviewed in detail by the Attending Physician  Scheduled Meds:  Scheduled Meds: .  amphetamine-dextroamphetamine  10 mg Oral Q breakfast  . lactulose  30 g Oral TID  . nadolol  40 mg Oral Daily  . sodium chloride  3 mL Intravenous Q12H    Time spent on care of this patient: 25+ mins   MCCLUNG,JEFFREY T , MD   [facility] Office  [phone] Pager - Text Page per Shea Evans as per below:  On-Call/Text Page:      [email or web address]      password [email or web address]  If 7PM-7AM, please contact night-coverage [email or web address] Password [email or web address] 05/22/2014, 1:03 PM   LOS: 0 days

## 2014-05-23 DIAGNOSIS — E7220 Disorder of urea cycle metabolism, unspecified: Secondary | ICD-10-CM

## 2014-05-23 LAB — COMPREHENSIVE METABOLIC PANEL
ALK PHOS: 69 U/L (ref 39–117)
ALT: 19 U/L (ref 0–53)
AST: 31 U/L (ref 0–37)
Albumin: 2.6 g/dL — ABNORMAL LOW (ref 3.5–5.2)
Anion gap: 12 (ref 5–15)
BUN: 31 mg/dL — ABNORMAL HIGH (ref 6–23)
CHLORIDE: 98 meq/L (ref 96–112)
CO2: 24 meq/L (ref 19–32)
Calcium: 8.2 mg/dL — ABNORMAL LOW (ref 8.4–10.5)
Creatinine, Ser: 1.48 mg/dL — ABNORMAL HIGH (ref 0.50–1.35)
GFR calc non Af Amer: 46 mL/min — ABNORMAL LOW (ref >90)
GFR, EST AFRICAN AMERICAN: 54 mL/min — AB (ref >90)
GLUCOSE: 79 mg/dL (ref 70–99)
POTASSIUM: 4.9 meq/L (ref 3.7–5.3)
Sodium: 134 mEq/L — ABNORMAL LOW (ref 137–147)
Total Bilirubin: 2.1 mg/dL — ABNORMAL HIGH (ref 0.3–1.2)
Total Protein: 5.2 g/dL — ABNORMAL LOW (ref 6.0–8.3)

## 2014-05-23 LAB — CBC
HCT: 36.9 % — ABNORMAL LOW (ref 39.0–52.0)
HEMOGLOBIN: 13 g/dL (ref 13.0–17.0)
MCH: 32.7 pg (ref 26.0–34.0)
MCHC: 35.2 g/dL (ref 30.0–36.0)
MCV: 92.9 fL (ref 78.0–100.0)
Platelets: 58 10*3/uL — ABNORMAL LOW (ref 150–400)
RBC: 3.97 MIL/uL — ABNORMAL LOW (ref 4.22–5.81)
RDW: 14.5 % (ref 11.5–15.5)
WBC: 6.8 10*3/uL (ref 4.0–10.5)

## 2014-05-23 LAB — AMMONIA: AMMONIA: 106 umol/L — AB (ref 11–60)

## 2014-05-23 LAB — PROTIME-INR
INR: 1.57 — ABNORMAL HIGH (ref 0.00–1.49)
Prothrombin Time: 18.8 seconds — ABNORMAL HIGH (ref 11.6–15.2)

## 2014-05-23 MED ORDER — NADOLOL 20 MG PO TABS
20.0000 mg | ORAL_TABLET | Freq: Every day | ORAL | Status: DC
  Filled 2014-05-23: qty 1

## 2014-05-23 NOTE — Plan of Care (Signed)
Problem: Phase I Progression Outcomes Goal: Voiding-avoid urinary catheter unless indicated Outcome: Not Met (add Reason) Urinary retention

## 2014-05-23 NOTE — Progress Notes (Signed)
[facility] TEAM 1 - Stepdown/ICU TEAM Progress Note  Joshua [name] YBO:[id number] DOB: April 20, 1946 DOA: 05/22/2014 PCP: [name], MD  Admit HPI / Brief Narrative: 68 y.o. male with history of hepatitis C and cirrhosis of liver was brought to the ER after patient was found to be increasingly confused as noticed by patient's friend. Patient was confused with slurred speech and had difficulty walking. He also had incontinence of his bowels. He was brought to the ER. Ammonia levels were elevated and CT head showed possible subacute stroke versus old stroke.  As per notes from [facility], where patient follows for his hepatitis C and cirrhosis of liver, patient's diuretics were increased on July 9 due to increasing edema.   HPI/Subjective: Awake and denies SOB or CP-states baseline BP much higher than current reading  Assessment/Plan:  Acute hepatic encephalopathy -resolving -repeat ammonia level down from max 225 to 106 -cont Lactulose  ?Subacute CVA Ruled out by MRI -results dw pt and wife  Hepatitis C w/ cirrhosis of the liver -LFTs stable -Child-Pugh score 9 c/w Class B -? If has varices but is on Nadolol- given soft BP will decrease dose from 40 to 20 mg  Thrombocytopenia  -platelets have been variable over the years and recent readings have been c/w that pattern  Coagulopathy -INR stable and no evidence of bleeding  Acute on chronic renal failure stage 3 -inching toward baseline  -suspect recent increase in diuretics caused dehydration with resultant worsening of encephalopathy -cont to hold Aldactone  Code Status: FULL Family Communication: Wife at bedside Disposition Plan: Transfer to floor-PT/OT evaluation pending  Consultants: none  Procedures: none  Antibiotics: none  DVT prophylaxis: SCDs  Objective: Blood pressure 118/48, pulse 59, temperature 98.2 F (36.8 C), temperature source Oral, resp. rate 14, height 5\' 6"  (1.676 m), weight 179 lb 7.3  oz (81.4 kg), SpO2 98.00%.  Intake/Output Summary (Last 24 hours) at 05/23/14 1416 Last data filed at 05/23/14 1300  Gross per 24 hour  Intake    360 ml  Output   3170 ml  Net  -2810 ml   Exam: Gen: No acute respiratory distress Chest: Clear to auscultation bilaterally without wheezes, rhonchi or crackles, room air Cardiac: Regular rate and rhythm, S1-S2, no rubs murmurs or gallops, 1+ lower extremity peripheral edema R > L, no JVD Abdomen: Soft nontender nondistended without obvious hepatosplenomegaly, no ascites Extremities: Symmetrical in appearance without cyanosis, clubbing or effusion  Data Reviewed: Basic Metabolic Panel:  Recent Labs Lab 05/21/14 2133 05/23/14 0330  NA 132* 134*  K 4.9 4.9  CL 95* 98  CO2 23 24  GLUCOSE 90 79  BUN 34* 31*  CREATININE 1.57* 1.48*  CALCIUM 9.1 8.2*    Liver Function Tests:  Recent Labs Lab 05/21/14 2133 05/23/14 0330  AST 37 31  ALT 22 19  ALKPHOS 86 69  BILITOT 2.4* 2.1*  PROT 6.5 5.2*  ALBUMIN 3.4* 2.6*   No results found for this basename: LIPASE, AMYLASE,  in the last 168 hours  Recent Labs Lab 05/21/14 2133 05/22/14 0055 05/22/14 0550 05/23/14 1053  AMMONIA 100* 84* 225* 106*    Coags:  Recent Labs Lab 05/22/14 0055 05/22/14 0717 05/23/14 0330  INR 1.38 1.58* 1.57*   No results found for this basename: PTT,  in the last 168 hours  CBC:  Recent Labs Lab 05/21/14 2133 05/23/14 0330  WBC 8.9 6.8  NEUTROABS 5.3  --   HGB 15.5 13.0  HCT 43.2 36.9*  MCV  91.5 92.9  PLT 91* 58*   CBG:  Recent Labs Lab 05/22/14 0804 05/22/14 1213  GLUCAP 88 120*    Recent Results (from the past 240 hour(s))  MRSA PCR SCREENING     Status: None   Collection Time    05/22/14  9:08 AM      Result Value Ref Range Status   MRSA by PCR NEGATIVE  NEGATIVE Final   Comment:            The GeneXpert MRSA Assay (FDA     approved for NASAL specimens     only), is one component of a     comprehensive MRSA  colonization     surveillance program. It is not     intended to diagnose MRSA     infection nor to guide or     monitor treatment for     MRSA infections.     Studies:  Recent x-ray studies have been reviewed in detail by the Attending Physician  Scheduled Meds:  Scheduled Meds: . amphetamine-dextroamphetamine  10 mg Oral Q breakfast  . lactulose  30 g Oral TID  . [START ON 05/24/2014] nadolol  20 mg Oral Daily  . sodium chloride  3 mL Intravenous Q12H    Time spent on care of this patient: 25+ mins   ELLIS,ALLISON L. , ANP  [facility] Office  [phone] Pager - Text Page per Shea Evans as per below:  On-Call/Text Page:      [email or web address]      password [email or web address]  If 7PM-7AM, please contact night-coverage [email or web address] Password [email or web address] 05/23/2014, 2:16 PM   LOS: 1 day      Patient seen and examined. Agree with the above note of assessment and plan.  Discussed in detail with the patient and his family at bedside who live in [place]. As per the patient, he stopped taking lactulose because of bowel incontinence and the next thing he knew he ended up in the hospital. His ammonia level is 106 today and he is alert and oriented. Plan for PT/OT and home health RN on discharge.patient lives at home by himself and his family live in [place].  Plan to slowly restart diuretics on discharge when his renal parameters improve.    Hosie Poisson, MD  [phone]

## 2014-05-23 NOTE — Progress Notes (Signed)
Report given to Gabe RN on [address], to transfer to 6E[address][id number] via wheelchair, Hazle Nordmann RN

## 2014-05-24 LAB — COMPREHENSIVE METABOLIC PANEL
ALBUMIN: 2.8 g/dL — AB (ref 3.5–5.2)
ALK PHOS: 82 U/L (ref 39–117)
ALT: 25 U/L (ref 0–53)
AST: 40 U/L — ABNORMAL HIGH (ref 0–37)
Anion gap: 10 (ref 5–15)
BUN: 18 mg/dL (ref 6–23)
CO2: 26 mEq/L (ref 19–32)
Calcium: 8.5 mg/dL (ref 8.4–10.5)
Chloride: 101 mEq/L (ref 96–112)
Creatinine, Ser: 1.11 mg/dL (ref 0.50–1.35)
GFR calc Af Amer: 76 mL/min — ABNORMAL LOW (ref >90)
GFR calc non Af Amer: 65 mL/min — ABNORMAL LOW (ref >90)
GLUCOSE: 81 mg/dL (ref 70–99)
POTASSIUM: 5.1 meq/L (ref 3.7–5.3)
SODIUM: 137 meq/L (ref 137–147)
Total Bilirubin: 1.9 mg/dL — ABNORMAL HIGH (ref 0.3–1.2)
Total Protein: 5.5 g/dL — ABNORMAL LOW (ref 6.0–8.3)

## 2014-05-24 LAB — CBC
HCT: 38.4 % — ABNORMAL LOW (ref 39.0–52.0)
HEMOGLOBIN: 13.2 g/dL (ref 13.0–17.0)
MCH: 31.8 pg (ref 26.0–34.0)
MCHC: 34.4 g/dL (ref 30.0–36.0)
MCV: 92.5 fL (ref 78.0–100.0)
Platelets: 55 10*3/uL — ABNORMAL LOW (ref 150–400)
RBC: 4.15 MIL/uL — AB (ref 4.22–5.81)
RDW: 14.5 % (ref 11.5–15.5)
WBC: 6 10*3/uL (ref 4.0–10.5)

## 2014-05-24 LAB — AMMONIA: Ammonia: 41 umol/L (ref 11–60)

## 2014-05-24 MED ORDER — SPIRONOLACTONE 100 MG PO TABS: 100.0000 mg | ORAL_TABLET | Freq: Two times a day (BID) | ORAL | Status: DC

## 2014-05-24 MED ORDER — FUROSEMIDE 80 MG PO TABS: 40.0000 mg | ORAL_TABLET | Freq: Every day | ORAL | Status: DC

## 2014-05-24 MED ORDER — WHITE PETROLATUM GEL
Status: AC
  Filled 2014-05-24: qty 5

## 2014-05-24 MED ORDER — LACTULOSE 10 GM/15ML PO SOLN: 30.0000 g | Freq: Three times a day (TID) | ORAL | Status: DC

## 2014-05-24 NOTE — Plan of Care (Signed)
Problem: Acute Rehab PT Goals(only PT should resolve) Goal: Pt Will Ambulate And no overt LOB throughout walk

## 2014-05-24 NOTE — Discharge Instructions (Signed)
Please take Lactulose as instructed, you should have 3 loose bowel movements daily. If you experience severe diarrhea, decrease the dose to twice daily and call your primary MD. If you have no bowel movements, please take 1-2 extra doses and call your PCP right away.   Daily weights, if you have more than 3 lbs weight gain or fluid buildup in 1-2 days call your PCP.   You were cared for by a hospitalist during your hospital stay. If you have any questions about your discharge medications or the care you received while you were in the hospital after you are discharged, you can call the unit and asked to speak with the hospitalist on call if the hospitalist that took care of you is not available. Once you are discharged, your primary care physician will handle any further medical issues. Please note that NO REFILLS for any discharge medications will be authorized once you are discharged, as it is imperative that you return to your primary care physician (or establish a relationship with a primary care physician if you do not have one) for your aftercare needs so that they can reassess your need for medications and monitor your lab values.     If you do not have a primary care physician, you can call [phone] for a physician referral.  Follow with Primary MD Joshua Seashore, MD in 5-7 days   Get CBC, CMP checked by your doctor and again as further instructed.  Get a 2 view Chest X ray done next visit if you had Pneumonia of Lung problems at the [facility] reviewed and adjusted.  Please request your Prim.MD to go over all Hospital Tests and Procedure/Radiological results at the follow up, please get all Hospital records sent to your Prim MD by signing hospital release before you go home.  Activity: As tolerated with Full fall precautions use walker/cane & assistance as needed  Diet: low salt  For Heart failure patients - Check your Weight same time everyday, if you gain over 2  pounds, or you develop in leg swelling, experience more shortness of breath or chest pain, call your Primary MD immediately. Follow Cardiac Low Salt Diet and 1.8 lit/day fluid restriction.  Disposition Home  If you experience worsening of your admission symptoms, develop shortness of breath, life threatening emergency, suicidal or homicidal thoughts you must seek medical attention immediately by calling 911 or calling your MD immediately  if symptoms less severe.  You Must read complete instructions/literature along with all the possible adverse reactions/side effects for all the Medicines you take and that have been prescribed to you. Take any new Medicines after you have completely understood and accpet all the possible adverse reactions/side effects.   Do not drive and provide baby sitting services if your were admitted for syncope or siezures until you have seen by Primary MD or a Neurologist and advised to do so again.  Do not drive when taking Pain medications.   Do not take more than prescribed Pain, Sleep and Anxiety Medications  Special Instructions: If you have smoked or chewed Tobacco  in the last 2 yrs please stop smoking, stop any regular Alcohol  and or any Recreational drug use.  Wear Seat belts while driving.

## 2014-05-24 NOTE — Progress Notes (Signed)
  Patient Discharge:  Disposition: home   Education: educated patient on cirrhosis.  Neurosurgeon given.  Educated patient and sister on lactulose prescription.  Patient and sister verbalized understanding.  Educated patient on fluid overload, signs and symptoms, and when to call a doctor.  Patient and sister verbalized understanding.    IV: bilateral IV's removed, clean dry and intact with no bleeding  Telemetry: n/a   Follow-up appointments: Patient verbalized that he would like to make his own follow-up appointments.  Patient instructed on how to make appointment, numbers given to patient.  Patient verbalized understanding  Prescriptions:  Lactulose prescription given to patient  Transportation:  Escorted out via wheelchair with volunteer services.   Belongings:
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
## 2014-05-24 NOTE — Evaluation (Signed)
Physical Therapy Evaluation Patient Details Name: Joshua [name] MRN: [id number] DOB: 03-26-1946 Today's Date: 05/24/2014   History of Present Illness  Pt. was admitted 05/22/24 with increasing confusion, slurred speech, difficulty walking and bowel incontinence .  Had been found by friend in stated condition.  Pt. found to have acute hepatic encephalopathy, subacute CVA was ruled out by MRI.  PMH inculues hep c, liver cirrhosis, anemia.    Clinical Impression  Joshua [name] presents to PT with slight initial decrease in walking balance as he exited his room, brushing the door slightly.  Once out in the hallway, he had no overt LOB and was able to alternate gait speed without difficulty.  He appears to have some mild and possibly residual cognitive issues, such as unable to locate his room following walk.  Also, knew location but thought it was "August 24 or 25 or 26th".  Pt does state that his sister can stay with him as long as necessary.  I do recommend 24 hour supervision due to his cognition.  Physically he should manage well at home.  OT is to further assess his cognition in more detail and will await report.    Follow Up Recommendations No PT follow up;Supervision/Assistance - 24 hour    Equipment Recommendations  None recommended by PT    Recommendations for Other Services       Precautions / Restrictions Precautions Precautions: Fall Restrictions Weight Bearing Restrictions: No      Mobility  Bed Mobility Overal bed mobility: Independent             General bed mobility comments: manages well  Transfers Overall transfer level: Modified independent Equipment used: None             General transfer comment: pt. needed slightly increased time to rise to stand from bed but no physical assist needed;  mod. independent in stand to sit in recliner with use of hands on armrests  Ambulation/Gait Ambulation/Gait assistance: Min guard;Modified independent (Device/Increase  time) Ambulation Distance (Feet): 200 Feet Assistive device: None Gait Pattern/deviations: Step-through pattern;WFL(Within Functional Limits) Gait velocity: near normal and pt. able to increase and decrease speed to command without difficulty   General Gait Details: Upon exiting pt's room, he had slight LOB and brushed into doorway.  He self corrected .  Once in the hallway, he had no overt LOB.    Stairs Stairs: Yes Stairs assistance: Modified independent (Device/Increase time) Stair Management: One rail Right;Alternating pattern;Forwards (alternating ascending; step to on descent) Number of Stairs: 4 General stair comments: Pt. reports history of left ankle sprain and since then, he has used step to technique for descending steps  Wheelchair Mobility    Modified Rankin (Stroke Patients Only)       Balance Overall balance assessment: Needs assistance         Standing balance support: No upper extremity supported;During functional activity Standing balance-Leahy Scale: Good   Single Leg Stance - Right Leg: 5 Single Leg Stance - Left Leg: 10           High Level Balance Comments: Pt. able to reach over and pick item up off floor with no LOB noted             Pertinent Vitals/Pain See vitals tab No pain, no distress    Home Living Family/patient expects to be discharged to:: Private residence Living Arrangements: Alone Available Help at Discharge: Friend(s);Family;Available 24 hours/day (stated sister can be with him 24 hours/day as long  as needed) Type of Home: House Home Access: Stairs to enter Entrance Stairs-Rails: Left Entrance Stairs-Number of Steps: 1 Home Layout: One level Home Equipment: Cane - single point      Prior Function Level of Independence: Independent         Comments: drives, works in [employer]        Extremity/Trunk Assessment   Upper Extremity Assessment: Defer to OT evaluation           Lower  Extremity Assessment: Overall WFL for tasks assessed      Cervical / Trunk Assessment: Normal  Communication   Communication: No difficulties  Cognition Arousal/Alertness: Awake/alert Behavior During Therapy: WFL for tasks assessed/performed Overall Cognitive Status: Impaired/Different from baseline Area of Impairment: Orientation Orientation Level: Time (pt. disoriented to month, thought August)   Memory: Decreased short-term memory         General Comments: pt. with limited recall of the events leading up to his hospitalization but able to clearly state that his ammonia level was high    General Comments      Exercises        Assessment/Plan    PT Assessment Patient needs continued PT services  PT Diagnosis Abnormality of gait   PT Problem List Decreased balance;Decreased mobility  PT Treatment Interventions DME instruction;Gait training;Balance training   PT Goals (Current goals can be found in the Care Plan section) Acute Rehab PT Goals Patient Stated Goal: home and resume work when able PT Goal Formulation: With patient Time For Goal Achievement: 05/27/14 Potential to Achieve Goals: Good    Frequency Min 3X/week   Barriers to discharge        Co-evaluation               End of Session   Activity Tolerance: Patient tolerated treatment well Patient left: in chair;with call bell/phone within reach Nurse Communication: Mobility status         Time: 0254-2706 PT Time Calculation (min): 30 min   Charges:   PT Evaluation $Initial PT Evaluation Tier I: 1 Procedure PT Treatments $Gait Training: 8-22 mins   PT G CodesLadona Ridgel 05/24/2014, 9:30 AM Gerlean Ren PT Acute Rehab Services [place] [phone]
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
## 2014-08-23 ENCOUNTER — Encounter (HOSPITAL_COMMUNITY): Payer: Self-pay | Admitting: Emergency Medicine

## 2014-08-23 ENCOUNTER — Emergency Department (HOSPITAL_COMMUNITY)
Admission: EM | Admit: 2014-08-23 | Discharge: 2014-08-23 | Disposition: A | Discharge status: Home / Self Care | Payer: BC Managed Care – PPO | Attending: Emergency Medicine | Admitting: Emergency Medicine

## 2014-08-23 ENCOUNTER — Emergency Department (HOSPITAL_COMMUNITY): Payer: BC Managed Care – PPO

## 2014-08-23 DIAGNOSIS — Z8739 Personal history of other diseases of the musculoskeletal system and connective tissue: Secondary | ICD-10-CM | POA: Diagnosis not present

## 2014-08-23 DIAGNOSIS — E7220 Disorder of urea cycle metabolism, unspecified: Secondary | ICD-10-CM | POA: Diagnosis not present

## 2014-08-23 DIAGNOSIS — Z98811 Dental restoration status: Secondary | ICD-10-CM | POA: Diagnosis not present

## 2014-08-23 DIAGNOSIS — Z79899 Other long term (current) drug therapy: Secondary | ICD-10-CM | POA: Diagnosis not present

## 2014-08-23 DIAGNOSIS — I10 Essential (primary) hypertension: Secondary | ICD-10-CM | POA: Diagnosis not present

## 2014-08-23 DIAGNOSIS — E724 Disorders of ornithine metabolism: Secondary | ICD-10-CM | POA: Insufficient documentation

## 2014-08-23 DIAGNOSIS — Z8719 Personal history of other diseases of the digestive system: Secondary | ICD-10-CM | POA: Diagnosis not present

## 2014-08-23 DIAGNOSIS — Z8619 Personal history of other infectious and parasitic diseases: Secondary | ICD-10-CM | POA: Diagnosis not present

## 2014-08-23 DIAGNOSIS — R4182 Altered mental status, unspecified: Secondary | ICD-10-CM | POA: Insufficient documentation

## 2014-08-23 DIAGNOSIS — Z720 Tobacco use: Secondary | ICD-10-CM | POA: Diagnosis not present

## 2014-08-23 DIAGNOSIS — D649 Anemia, unspecified: Secondary | ICD-10-CM | POA: Insufficient documentation

## 2014-08-23 LAB — RAPID URINE DRUG SCREEN, HOSP PERFORMED
Amphetamines: NOT DETECTED
BENZODIAZEPINES: NOT DETECTED
Barbiturates: NOT DETECTED
COCAINE: NOT DETECTED
Opiates: NOT DETECTED
TETRAHYDROCANNABINOL: NOT DETECTED

## 2014-08-23 LAB — URINALYSIS, ROUTINE W REFLEX MICROSCOPIC
Bilirubin Urine: NEGATIVE
GLUCOSE, UA: NEGATIVE mg/dL
Ketones, ur: NEGATIVE mg/dL
Nitrite: NEGATIVE
PH: 7 (ref 5.0–8.0)
Protein, ur: NEGATIVE mg/dL
Specific Gravity, Urine: 1.006 (ref 1.005–1.030)
Urobilinogen, UA: 1 mg/dL (ref 0.0–1.0)

## 2014-08-23 LAB — COMPREHENSIVE METABOLIC PANEL
ALK PHOS: 89 U/L (ref 39–117)
ALT: 22 U/L (ref 0–53)
AST: 38 U/L — ABNORMAL HIGH (ref 0–37)
Albumin: 3.1 g/dL — ABNORMAL LOW (ref 3.5–5.2)
Anion gap: 13 (ref 5–15)
BUN: 23 mg/dL (ref 6–23)
CO2: 22 mEq/L (ref 19–32)
Calcium: 8.7 mg/dL (ref 8.4–10.5)
Chloride: 102 mEq/L (ref 96–112)
Creatinine, Ser: 1.27 mg/dL (ref 0.50–1.35)
GFR calc Af Amer: 64 mL/min — ABNORMAL LOW (ref >90)
GFR calc non Af Amer: 56 mL/min — ABNORMAL LOW (ref >90)
GLUCOSE: 104 mg/dL — AB (ref 70–99)
Potassium: 4.1 mEq/L (ref 3.7–5.3)
SODIUM: 137 meq/L (ref 137–147)
TOTAL PROTEIN: 6.5 g/dL (ref 6.0–8.3)
Total Bilirubin: 2.4 mg/dL — ABNORMAL HIGH (ref 0.3–1.2)

## 2014-08-23 LAB — CBG MONITORING, ED: Glucose-Capillary: 101 mg/dL — ABNORMAL HIGH (ref 70–99)

## 2014-08-23 LAB — URINE MICROSCOPIC-ADD ON

## 2014-08-23 LAB — AMMONIA: AMMONIA: 99 umol/L — AB (ref 11–60)

## 2014-08-23 LAB — PRO B NATRIURETIC PEPTIDE: PRO B NATRI PEPTIDE: 86.9 pg/mL (ref 0–125)

## 2014-08-23 LAB — CBC
HCT: 38.4 % — ABNORMAL LOW (ref 39.0–52.0)
HEMOGLOBIN: 13.3 g/dL (ref 13.0–17.0)
MCH: 32.9 pg (ref 26.0–34.0)
MCHC: 34.6 g/dL (ref 30.0–36.0)
MCV: 95 fL (ref 78.0–100.0)
Platelets: 82 10*3/uL — ABNORMAL LOW (ref 150–400)
RBC: 4.04 MIL/uL — ABNORMAL LOW (ref 4.22–5.81)
RDW: 14.5 % (ref 11.5–15.5)
WBC: 7.5 10*3/uL (ref 4.0–10.5)

## 2014-08-23 LAB — TROPONIN I: Troponin I: <0.3 ng/mL (ref <0.30)

## 2014-08-23 LAB — ACETAMINOPHEN LEVEL: Acetaminophen (Tylenol), Serum: <15 ug/mL (ref 10–30)

## 2014-08-23 LAB — PROTIME-INR
INR: 1.31 (ref 0.00–1.49)
Prothrombin Time: 16.4 seconds — ABNORMAL HIGH (ref 11.6–15.2)

## 2014-08-23 LAB — SALICYLATE LEVEL: Salicylate Lvl: <2 mg/dL — ABNORMAL LOW (ref 2.8–20.0)

## 2014-08-23 LAB — I-STAT CG4 LACTIC ACID, ED
LACTIC ACID, VENOUS: 0.89 mmol/L (ref 0.5–2.2)
Lactic Acid, Venous: 2.64 mmol/L — ABNORMAL HIGH (ref 0.5–2.2)

## 2014-08-23 LAB — ETHANOL: Alcohol, Ethyl (B): <11 mg/dL (ref 0–11)

## 2014-08-23 MED ORDER — SODIUM CHLORIDE 0.9 % IV BOLUS (SEPSIS)
1000.0000 mL | Freq: Once | INTRAVENOUS | Status: AC
  Administered 2014-08-23: 1000 mL via INTRAVENOUS

## 2014-08-23 NOTE — ED Notes (Signed)
EDP at bedside  

## 2014-08-23 NOTE — ED Notes (Addendum)
Per pt sts confusion that started today. Pt sts hx of same with ammonia levels elevated.

## 2014-08-23 NOTE — ED Provider Notes (Addendum)
Patient was confused earlier today stated he could not put his shoes on. He presently feels asymptomatic and looks at his baseline mental status to his companion. Patient is alert Glasgow Coma Score 15 ambulates without difficulty with His Shoes without Difficulty Oriented 3. No asterixis Hospitalization Offered the Patient Which He Declined. Patient Likely with Mild Hepatic Encephalopathy Which He States Waxes and Wanes.  Date: 08/23/2014  Rate: 65  Rhythm: normal sinus rhythm  QRS Axis: normal  Intervals: normal  ST/T Wave abnormalities: normal  Conduction Disutrbances:none  Narrative Interpretation:   Old EKG Reviewed: Unchanged from 05/22/2014 interpreted by me Results for orders placed during the hospital encounter of 08/23/14  CBC      Result Value Ref Range   WBC 7.5  4.0 - 10.5 K/uL   RBC 4.04 (*) 4.22 - 5.81 MIL/uL   Hemoglobin 13.3  13.0 - 17.0 g/dL   HCT 38.4 (*) 39.0 - 52.0 %   MCV 95.0  78.0 - 100.0 fL   MCH 32.9  26.0 - 34.0 pg   MCHC 34.6  30.0 - 36.0 g/dL   RDW 14.5  11.5 - 15.5 %   Platelets 82 (*) 150 - 400 K/uL  COMPREHENSIVE METABOLIC PANEL      Result Value Ref Range   Sodium 137  137 - 147 mEq/L   Potassium 4.1  3.7 - 5.3 mEq/L   Chloride 102  96 - 112 mEq/L   CO2 22  19 - 32 mEq/L   Glucose, Bld 104 (*) 70 - 99 mg/dL   BUN 23  6 - 23 mg/dL   Creatinine, Ser 1.27  0.50 - 1.35 mg/dL   Calcium 8.7  8.4 - 10.5 mg/dL   Total Protein 6.5  6.0 - 8.3 g/dL   Albumin 3.1 (*) 3.5 - 5.2 g/dL   AST 38 (*) 0 - 37 U/L   ALT 22  0 - 53 U/L   Alkaline Phosphatase 89  39 - 117 U/L   Total Bilirubin 2.4 (*) 0.3 - 1.2 mg/dL   GFR calc non Af Amer 56 (*) >90 mL/min   GFR calc Af Amer 64 (*) >90 mL/min   Anion gap 13  5 - 15  PROTIME-INR      Result Value Ref Range   Prothrombin Time 16.4 (*) 11.6 - 15.2 seconds   INR 1.31  0.00 - 1.49  URINALYSIS, ROUTINE W REFLEX MICROSCOPIC      Result Value Ref Range   Color, Urine YELLOW  YELLOW   APPearance CLEAR  CLEAR   Specific Gravity, Urine 1.006  1.005 - 1.030   pH 7.0  5.0 - 8.0   Glucose, UA NEGATIVE  NEGATIVE mg/dL   Hgb urine dipstick TRACE (*) NEGATIVE   Bilirubin Urine NEGATIVE  NEGATIVE   Ketones, ur NEGATIVE  NEGATIVE mg/dL   Protein, ur NEGATIVE  NEGATIVE mg/dL   Urobilinogen, UA 1.0  0.0 - 1.0 mg/dL   Nitrite NEGATIVE  NEGATIVE   Leukocytes, UA TRACE (*) NEGATIVE  AMMONIA      Result Value Ref Range   Ammonia 99 (*) 11 - 60 umol/L  TROPONIN I      Result Value Ref Range   Troponin I <0.30  <0.30 ng/mL  PRO B NATRIURETIC PEPTIDE      Result Value Ref Range   Pro B Natriuretic peptide (BNP) 86.9  0 - 125 pg/mL  URINE MICROSCOPIC-ADD ON      Result Value Ref Range  Squamous Epithelial / LPF RARE  RARE   WBC, UA 0-2  <3 WBC/hpf   RBC / HPF 0-2  <3 RBC/hpf   Bacteria, UA RARE  RARE   Casts HYALINE CASTS (*) NEGATIVE  ETHANOL      Result Value Ref Range   Alcohol, Ethyl (B) <11  0 - 11 mg/dL  URINE RAPID DRUG SCREEN (HOSP PERFORMED)      Result Value Ref Range   Opiates NONE DETECTED  NONE DETECTED   Cocaine NONE DETECTED  NONE DETECTED   Benzodiazepines NONE DETECTED  NONE DETECTED   Amphetamines NONE DETECTED  NONE DETECTED   Tetrahydrocannabinol NONE DETECTED  NONE DETECTED   Barbiturates NONE DETECTED  NONE DETECTED  SALICYLATE LEVEL      Result Value Ref Range   Salicylate Lvl <6.7 (*) 2.8 - 20.0 mg/dL  ACETAMINOPHEN LEVEL      Result Value Ref Range   Acetaminophen (Tylenol), Serum <15.0  10 - 30 ug/mL  CBG MONITORING, ED      Result Value Ref Range   Glucose-Capillary 101 (*) 70 - 99 mg/dL   Comment 1 Notify RN    I-STAT CG4 LACTIC ACID, ED      Result Value Ref Range   Lactic Acid, Venous 2.64 (*) 0.5 - 2.2 mmol/L  I-STAT CG4 LACTIC ACID, ED      Result Value Ref Range   Lactic Acid, Venous 0.89  0.5 - 2.2 mmol/L   Dg Chest 2 View  08/23/2014   CLINICAL DATA:  Altered mental status.  Hypertension .  EXAM: CHEST  2 VIEW  COMPARISON:  05/22/2014.  FINDINGS:  Mediastinum hilar structures are normal. Mild left base subsegmental atelectasis. Lungs otherwise clear. No pleural effusion or pneumothorax. Heart size stable. Mild degenerative changes thoracic spine.  IMPRESSION: Mild left base subsegmental atelectasis, otherwise negative chest.   Electronically Signed   By: [place]   On: 08/23/2014 20:44   Ct Head Wo Contrast  08/23/2014   CLINICAL DATA:  Altered mental state.  EXAM: CT HEAD WITHOUT CONTRAST  TECHNIQUE: Contiguous axial images were obtained from the base of the skull through the vertex without intravenous contrast.  COMPARISON:  05/22/2014  FINDINGS: There is no evidence of intracranial hemorrhage, brain edema, or other signs of acute infarction. There is no evidence of intracranial mass lesion or mass effect. No abnormal extraaxial fluid collections are identified.  Mild diffuse cerebral atrophy is stable. 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 No evidence of hydrocephalus. No skull abnormality identified  IMPRESSION: No acute intracranial findings.  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   Electronically Signed   By: Earle Gell M.D.   On: 08/23/2014 21:46    Orlie Dakin, MD 08/23/14 [place], MD 08/23/14 [phone]

## 2014-08-23 NOTE — ED Notes (Signed)
Tech in room completed ECG and CBG

## 2014-08-23 NOTE — ED Provider Notes (Signed)
CSN: [id number]     Arrival date & time 08/23/14  1738 History   First MD Initiated Contact with Patient 08/23/14 1857     Chief Complaint  Patient presents with  . Altered Mental Status     (Consider location/radiation/quality/duration/timing/severity/associated sxs/prior Treatment) The history is provided by the patient and a friend. No language interpreter was used.  Joshua [name] is a 68 y/o M with PMhx of arthritis, Hep C, HTN, anemia, cirrhosis presenting to the ED with confusion that started today. Patient reported that when he woke up this morning he has not been feeling himself. Friend that accompanies patient reported that he received a phone call and knew something was wrong. Reported that when he arrived at the patient's house reported that the patient was dressed, but was having issues in tying his shoes also reported that the patient kept putting clothing on and stated that he was putting his pants on top of the pants that he already had on. patient has a history of elevated ammonia levels - patient reported that he has been taking his lactulose as prescribed. enied fever, chills, neck pain, neck stiffness, chest pain, shortness of breath, difficulty breathing, cough, abdominal pain, nausea, vomiting, blurred vision, sudden loss of vision, headache, dizziness. PCP Dr. Ashby Dawes  Past Medical History  Diagnosis Date  . Arthritis   . Hepatitis C   . Hypertension   . Wears glasses   . Wears partial dentures     top partial  . Anemia     low platelets/liver disease  . Cirrhosis    Past Surgical History  Procedure Laterality Date  . Tonsillectomy    . Appendectomy    . Eye surgery      both cataracts  . Pilonidal cyst excision  1960  . Colonoscopy    . Upper gi endoscopy    . Shoulder arthroscopy with subacromial decompression, rotator cuff repair and bicep tendon repair Left 08/09/2013    Procedure: LEFT SHOULDER ARTHROSCOPY WITH SUBACROMIAL DECOMPRESSION, DISTAL  CLAVICULECTOMY;  Surgeon: Ninetta Lights, MD;  Location: [facility];  Service: Orthopedics;  Laterality: Left;   Family History  Problem Relation Age of Onset  . Cirrhosis Mother   . Cirrhosis Father   . CAD Neg Hx    History  Substance Use Topics  . Smoking status: Light Tobacco Smoker  . Smokeless tobacco: Current User  . Alcohol Use: Yes    Review of Systems  Constitutional: Negative for fever and chills.  Respiratory: Negative for chest tightness and shortness of breath.   Cardiovascular: Negative for chest pain.  Gastrointestinal: Negative for nausea, vomiting, abdominal pain and diarrhea.  Musculoskeletal: Negative for back pain, neck pain and neck stiffness.  Neurological: Negative for dizziness, weakness and headaches.  Psychiatric/Behavioral: Positive for confusion.      Allergies  Codeine  Home Medications   Prior to Admission medications   Medication Sig Start Date End Date Taking? Authorizing Provider  amphetamine-dextroamphetamine (ADDERALL) 10 MG tablet Take 20 mg by mouth daily.  06/10/12  Yes Historical Provider, MD  Docusate Calcium (STOOL SOFTENER PO) Take 1 tablet by mouth daily as needed (constipation).    Yes Historical Provider, MD  furosemide (LASIX) 80 MG tablet Take 0.5 tablets (40 mg total) by mouth daily. 05/24/14  Yes Costin Karlyne Greenspan, MD  lactulose (CHRONULAC) 10 GM/15ML solution Take 30 g by mouth 3 (three) times daily. 05/24/14  Yes Costin Karlyne Greenspan, MD  Multiple Vitamins-Minerals (MULTIVITAMIN WITH MINERALS)  tablet Take 1 tablet by mouth daily.   Yes Historical Provider, MD  nadolol (CORGARD) 40 MG tablet Take 40 mg by mouth every evening.  01/15/14  Yes Historical Provider, MD  Polyethyl Glycol-Propyl Glycol (SYSTANE OP) Place 1 drop into both eyes at bedtime.   Yes Historical Provider, MD  spironolactone (ALDACTONE) 100 MG tablet Take 1 tablet (100 mg total) by mouth 2 (two) times daily. 05/24/14  Yes Costin Karlyne Greenspan, MD   BP  105/55  Pulse 65  Temp(Src) 98.8 F (37.1 C) (Rectal)  Resp 16  SpO2 98% Physical Exam  Nursing note and vitals reviewed. Constitutional: He is oriented to person, place, and time. He appears well-developed and well-nourished. No distress.  HENT:  Head: Normocephalic and atraumatic.  Mouth/Throat: Oropharynx is clear and moist. No oropharyngeal exudate.  Eyes: Conjunctivae and EOM are normal. Pupils are equal, round, and reactive to light. Right eye exhibits no discharge. Left eye exhibits no discharge.  Negative nystagmus Visual fields grossly intact EOMs intact  Neck: Normal range of motion. Neck supple. No tracheal deviation present.  Negative neck stiffness Negative nuchal rigidity Negative cervical lymphadenopathy Negative meningeal signs  Cardiovascular: Normal rate, regular rhythm and normal heart sounds.  Exam reveals no friction rub.   No murmur heard. Refill less than 3 seconds 2+ pitting edema identified to the extremities bilaterally from dorsal aspect of the feet bilaterally to just below the knee bilaterally  Pulmonary/Chest: Effort normal and breath sounds normal. No respiratory distress. He has no wheezes. He has no rales.  Patient is able to speak in full sentences without difficulty Negative use of accessory muscles Negative stridor  Musculoskeletal: Normal range of motion.  Full ROM to upper and lower extremities without difficulty noted, negative ataxia noted.  Lymphadenopathy:    He has no cervical adenopathy.  Neurological: He is alert and oriented to person, place, and time. No cranial nerve deficit. He exhibits normal muscle tone. Coordination normal.  Cranial nerves III-XII grossly intact Strength 5+/5+ to upper and lower extremities bilaterally with resistance applied, equal distribution noted Equal grips bilaterally Negative facial droop Negative slurred speech Negative aphasia Patient is able to bring finger-to-nose bilaterally without difficulty or  ataxia  Heel to knee down shin bilaterally without difficulty or ataxia Negative arm drift Fine motor skills intact Patient response to questions appropriately Patient follows commands well  Skin: Skin is warm and dry. No rash noted. He is not diaphoretic. No erythema.  Psychiatric: He has a normal mood and affect. His behavior is normal. Thought content normal.    ED Course  Procedures (including critical care time)  Results for orders placed during the hospital encounter of 08/23/14  CBC      Result Value Ref Range   WBC 7.5  4.0 - 10.5 K/uL   RBC 4.04 (*) 4.22 - 5.81 MIL/uL   Hemoglobin 13.3  13.0 - 17.0 g/dL   HCT 38.4 (*) 39.0 - 52.0 %   MCV 95.0  78.0 - 100.0 fL   MCH 32.9  26.0 - 34.0 pg   MCHC 34.6  30.0 - 36.0 g/dL   RDW 14.5  11.5 - 15.5 %   Platelets 82 (*) 150 - 400 K/uL  COMPREHENSIVE METABOLIC PANEL      Result Value Ref Range   Sodium 137  137 - 147 mEq/L   Potassium 4.1  3.7 - 5.3 mEq/L   Chloride 102  96 - 112 mEq/L   CO2 22  19 - 32  mEq/L   Glucose, Bld 104 (*) 70 - 99 mg/dL   BUN 23  6 - 23 mg/dL   Creatinine, Ser 1.27  0.50 - 1.35 mg/dL   Calcium 8.7  8.4 - 10.5 mg/dL   Total Protein 6.5  6.0 - 8.3 g/dL   Albumin 3.1 (*) 3.5 - 5.2 g/dL   AST 38 (*) 0 - 37 U/L   ALT 22  0 - 53 U/L   Alkaline Phosphatase 89  39 - 117 U/L   Total Bilirubin 2.4 (*) 0.3 - 1.2 mg/dL   GFR calc non Af Amer 56 (*) >90 mL/min   GFR calc Af Amer 64 (*) >90 mL/min   Anion gap 13  5 - 15  PROTIME-INR      Result Value Ref Range   Prothrombin Time 16.4 (*) 11.6 - 15.2 seconds   INR 1.31  0.00 - 1.49  URINALYSIS, ROUTINE W REFLEX MICROSCOPIC      Result Value Ref Range   Color, Urine YELLOW  YELLOW   APPearance CLEAR  CLEAR   Specific Gravity, Urine 1.006  1.005 - 1.030   pH 7.0  5.0 - 8.0   Glucose, UA NEGATIVE  NEGATIVE mg/dL   Hgb urine dipstick TRACE (*) NEGATIVE   Bilirubin Urine NEGATIVE  NEGATIVE   Ketones, ur NEGATIVE  NEGATIVE mg/dL   Protein, ur NEGATIVE   NEGATIVE mg/dL   Urobilinogen, UA 1.0  0.0 - 1.0 mg/dL   Nitrite NEGATIVE  NEGATIVE   Leukocytes, UA TRACE (*) NEGATIVE  AMMONIA      Result Value Ref Range   Ammonia 99 (*) 11 - 60 umol/L  TROPONIN I      Result Value Ref Range   Troponin I <0.30  <0.30 ng/mL  PRO B NATRIURETIC PEPTIDE      Result Value Ref Range   Pro B Natriuretic peptide (BNP) 86.9  0 - 125 pg/mL  URINE MICROSCOPIC-ADD ON      Result Value Ref Range   Squamous Epithelial / LPF RARE  RARE   WBC, UA 0-2  <3 WBC/hpf   RBC / HPF 0-2  <3 RBC/hpf   Bacteria, UA RARE  RARE   Casts HYALINE CASTS (*) NEGATIVE  ETHANOL      Result Value Ref Range   Alcohol, Ethyl (B) <11  0 - 11 mg/dL  URINE RAPID DRUG SCREEN (HOSP PERFORMED)      Result Value Ref Range   Opiates NONE DETECTED  NONE DETECTED   Cocaine NONE DETECTED  NONE DETECTED   Benzodiazepines NONE DETECTED  NONE DETECTED   Amphetamines NONE DETECTED  NONE DETECTED   Tetrahydrocannabinol NONE DETECTED  NONE DETECTED   Barbiturates NONE DETECTED  NONE DETECTED  SALICYLATE LEVEL      Result Value Ref Range   Salicylate Lvl <8.8 (*) 2.8 - 20.0 mg/dL  ACETAMINOPHEN LEVEL      Result Value Ref Range   Acetaminophen (Tylenol), Serum <15.0  10 - 30 ug/mL  CBG MONITORING, ED      Result Value Ref Range   Glucose-Capillary 101 (*) 70 - 99 mg/dL   Comment 1 Notify RN    I-STAT CG4 LACTIC ACID, ED      Result Value Ref Range   Lactic Acid, Venous 2.64 (*) 0.5 - 2.2 mmol/L    Labs Review Labs Reviewed  CBC - Abnormal; Notable for the following:    RBC 4.04 (*)    HCT 38.4 (*)  Platelets 82 (*)    All other components within normal limits  COMPREHENSIVE METABOLIC PANEL - Abnormal; Notable for the following:    Glucose, Bld 104 (*)    Albumin 3.1 (*)    AST 38 (*)    Total Bilirubin 2.4 (*)    GFR calc non Af Amer 56 (*)    GFR calc Af Amer 64 (*)    All other components within normal limits  PROTIME-INR - Abnormal; Notable for the following:     Prothrombin Time 16.4 (*)    All other components within normal limits  URINALYSIS, ROUTINE W REFLEX MICROSCOPIC - Abnormal; Notable for the following:    Hgb urine dipstick TRACE (*)    Leukocytes, UA TRACE (*)    All other components within normal limits  AMMONIA - Abnormal; Notable for the following:    Ammonia 99 (*)    All other components within normal limits  URINE MICROSCOPIC-ADD ON - Abnormal; Notable for the following:    Casts HYALINE CASTS (*)    All other components within normal limits  SALICYLATE LEVEL - Abnormal; Notable for the following:    Salicylate Lvl <1.7 (*)    All other components within normal limits  CBG MONITORING, ED - Abnormal; Notable for the following:    Glucose-Capillary 101 (*)    All other components within normal limits  I-STAT CG4 LACTIC ACID, ED - Abnormal; Notable for the following:    Lactic Acid, Venous 2.64 (*)    All other components within normal limits  TROPONIN I  PRO B NATRIURETIC PEPTIDE  ETHANOL  URINE RAPID DRUG SCREEN (HOSP PERFORMED)  ACETAMINOPHEN LEVEL    Imaging Review Dg Chest 2 View  08/23/2014   CLINICAL DATA:  Altered mental status.  Hypertension .  EXAM: CHEST  2 VIEW  COMPARISON:  05/22/2014.  FINDINGS: Mediastinum hilar structures are normal. Mild left base subsegmental atelectasis. Lungs otherwise clear. No pleural effusion or pneumothorax. Heart size stable. Mild degenerative changes thoracic spine.  IMPRESSION: Mild left base subsegmental atelectasis, otherwise negative chest.   Electronically Signed   By: Schall Circle   On: 08/23/2014 20:44   Ct Head Wo Contrast  08/23/2014   CLINICAL DATA:  Altered mental state.  EXAM: CT HEAD WITHOUT CONTRAST  TECHNIQUE: Contiguous axial images were obtained from the base of the skull through the vertex without intravenous contrast.  COMPARISON:  05/22/2014  FINDINGS: There is no evidence of intracranial hemorrhage, brain edema, or other signs of acute infarction. There is no  evidence of intracranial mass lesion or mass effect. No abnormal extraaxial fluid collections are identified.  Mild diffuse cerebral atrophy is stable. 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 No evidence of hydrocephalus. No skull abnormality identified  IMPRESSION: No acute intracranial findings.  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   Electronically Signed   By: Earle Gell M.D.   On: 08/23/2014 21:46     EKG Interpretation None     9:53 PM Patient seen and assessed by attending physician, Dr. Lyn Hollingshead. As per attending physician reported that patient can be discharged home - stated that patient is at baseline and that friend that accompanies patient reported that patient is normal and at baseline and feels comfortable for patient to be discharged home.   9:59 PM This provider discussed labs and imaging in great detail with patient. Friend at bedside reported that patient is at  baseline and feels comfortable for patient to go home.   MDM   Final diagnoses:  Altered mental state  Hyperammonemia    Medications  sodium chloride 0.9 % bolus 1,000 mL (1,000 mLs Intravenous New Bag/Given 08/23/14 1928)    Filed Vitals:   08/23/14 1945 08/23/14 2100 08/23/14 2115 08/23/14 2145  BP: 129/63 120/59 105/54 105/55  Pulse: 60 63 63 65  Temp:      TempSrc:      Resp: 27 17 15 16   SpO2: 100% 100% 100% 98%    This provider reviewed the patient's chart. Patient was admitted to the hospital on 05/22/2014 regarding elevated ammonia levels with associated confusion.  EKG noted normal sinus rhythm with a heart rate of 61 bpm - prolonged PR interval noted. Troponin negative elevation. CBC negative elevated white blood cell count. Platelet count low, but whne compared to previous labs patient has been as low as 55. CMP noted a mildly elevated AST of 38 and elevated bilirubin of 2.4 - has been chronically  elevated when compared to previous labs. BNP negative elevation. PT INR unremarkable. Ammonia elevated at 99. Urinalysis negative for infection. Urine drug screen unremarkable. CBG 101. Acetaminophen, salicylate level, ethanol level negative elevation. Lactic acid elevated at 2.64 - lactic acid 0.89 after IV hydration. Chest x-ray noted mild left base subsegmental atelectasis-otherwise unremarkable findings. CT head negative for acute intracranial abnormalities. Imaging unremarkable. Evident ammonia level identified-patient has history of elevated ammonia level-the patient appears to be at baseline, alert and oriented with following commands well and performing motions without difficulty or ataxia. Patient responds to questions in appropriate manner. Patient reported that he is feeling better. Patient seen and assessed by attending physician, Dr. Cathleen Fears recommends patient to be discharged home. Both this provider and attending physician recommended admission that patient declined - stated that he feels fine and would like to go home. Patient reports that he has appointment with his primary care provider on 09/04/2014. Negative findings of infection. Patient stable, afebrile. Patient had septic appearing. Patient back at baseline. Friends who accompanies patient agrees that patient is back at baseline and feels comfortable for patient to be discharged home. Patient discharged. Discussed patient to continue taking lactulose at home. Discussed with patient to closely monitor symptoms and if symptoms are to worsen or change to report back to the ED - strict return instructions given.  Patient agreed to plan of care, understood, all questions answered.   [employer], PA-C 08/23/14 2310

## 2014-08-23 NOTE — ED Notes (Signed)
Pt friend states when he went to pt's house, pt was attempting to put multiple pairs of clothes on top of clothes, pt didn't know where his dog was however his dog is at the vet. Very confused. However, pt knows where he is, who he is, what day/month it is, etc.

## 2014-08-23 NOTE — ED Notes (Signed)
Bilateral leg swelling, +4 pitting edema. Pt states the confusion began today however he always retains fluid in his leg but pt states "they aren't always this bad."

## 2014-08-23 NOTE — ED Notes (Signed)
Pt very confused at this time. Pt went to restroom to urinate for a sample and urinated all over hisself and forgot what he was in there to do. New gown placed on pt. Will try again for urine later.

## 2014-08-23 NOTE — ED Notes (Signed)
Called CT stated will transport CT soon.

## 2014-08-23 NOTE — Discharge Instructions (Signed)
Please call your doctor for a followup appointment within 24-48 hours. When you talk to your doctor please let them know that you were seen in the emergency department and have them acquire all of your records so that they can discuss the findings with you and formulate a treatment plan to fully care for your new and ongoing problems. Please call and set-up an appointment with your primary care provider to be seen and re-assessed by the beginning of next week Please continue to take lactulose as prescribed Please rest and stay hydrated Please avoid any physical or strenuous activity  Please continue to monitor symptoms closely and if symptoms are to worsen or change (fever greater than 101, chills, sweating, nausea, vomiting, chest pain, shortness of breathe, difficulty breathing, weakness, numbness, tingling, worsening or changes to pain pattern, fall, head injury, confusion, inability to perform activities of daily living) please report back to the Emergency Department immediately.   Ammonia, Plasma Ammonia This is a test to detect elevated levels of ammonia in the blood, to evaluate changes in consciousness, or to help diagnose hepatic encephalopathy and Reye syndrome. It may be ordered when a patient experiences mental changes or lapses into a coma of unknown origin, if an infant or child experiences frequent vomiting and increased lethargy as a newborn, or about a week after a viral illness. Ammonia is a compound produced by intestinal bacteria and by cells in the body during the digestion of protein. Ammonia is a waste product that the liver changes into urea and glutamine. The urea is then carried by the blood to the kidneys, where it is put out in the urine. If this "urea cycle" does not complete, ammonia builds up in the blood. This also happens when you cannot put out urine (kidney failure) or when your liver does not work (hepatic failure). A buildup of ammonia in the body can cause mental and  neurological changes that can lead to confusion, disorientation, sleepiness, and eventually to coma and even death. Infants and children with increased ammonia levels may vomit frequently, be irritable, and be increasingly lethargic. Left untreated, they may experience seizures, respiratory difficulty, and may lapse into a coma and die. PREPARATION FOR TEST No preparation or fasting is necessary. A blood sample is taken by a needle from a vein.  Avoid exercising before this test. NORMAL FINDINGS  Normal values depend on the method used for testing. Test results depend on many factors including age, sex, etc. Your lab report should include the specific reference range for your test. Your caregiver will go over you test results with you.  Adults: 10-80 mcg/dL (6-47 micromole/L)  Neonates, 0 to 10 days (enzymatic): 170-341 mcg/dL (100-200 micromole/L)  Infants and toddlers, 10 days to 2 years (enzymatic): 68-136 mcg/dL (40-80 micromole/L)  Children, older than 2 years (enzymatic): 19-60 mcg/dL (11-35 micromole/L) Ranges for normal findings may vary among different laboratories and hospitals. You should always check with your doctor after having lab work or other tests done to discuss the meaning of your test results and whether your values are considered within normal limits. MEANING OF TEST  Your caregiver will go over the test results with you and discuss the importance and meaning of your results, as well as treatment options and the need for additional tests if necessary. OBTAINING THE TEST RESULTS  It is your responsibility to obtain your test results. Ask the lab or department performing the test when and how you will get your results. Document Released: 11/02/2004 Document  Revised: 02/25/2014 Document Reviewed: 09/16/2008 [facility] Patient Information 2015 [place], [place]. This information is not intended to replace advice given to you by your health care provider. Make sure you discuss any  questions you have with your health care provider.

## 2014-08-23 NOTE — ED Provider Notes (Signed)
Medical screening examination/treatment/procedure(s) were conducted as a shared visit with non-physician practitioner(s) and myself.  I personally evaluated the patient during the encounter.   EKG Interpretation None       Orlie Dakin, MD 08/23/14 [phone]

## 2014-08-23 NOTE — ED Notes (Signed)
Pt gone to CT 

## 2014-08-23 NOTE — ED Notes (Signed)
Pt states he felt like this before and his ammonia levels were high. Pt at this time alert and oriented x4; however, joking a lot and not taking assessment very serious.

## 2014-08-23 NOTE — ED Notes (Signed)
Pt back from CT

## 2014-08-23 NOTE — ED Notes (Signed)
Dr Winfred Leeds given a copy of lactic acid results 2.64

## 2014-10-02 ENCOUNTER — Other Ambulatory Visit: Payer: Self-pay | Admitting: Dermatology

## 2015-01-30 ENCOUNTER — Other Ambulatory Visit: Payer: Self-pay | Admitting: Dermatology

## 2015-01-30 DIAGNOSIS — D045 Carcinoma in situ of skin of trunk: Secondary | ICD-10-CM | POA: Diagnosis not present

## 2015-01-30 DIAGNOSIS — C44519 Basal cell carcinoma of skin of other part of trunk: Secondary | ICD-10-CM | POA: Diagnosis not present

## 2015-01-30 DIAGNOSIS — C44529 Squamous cell carcinoma of skin of other part of trunk: Secondary | ICD-10-CM | POA: Diagnosis not present

## 2015-02-03 DIAGNOSIS — K7460 Unspecified cirrhosis of liver: Secondary | ICD-10-CM | POA: Diagnosis not present

## 2015-02-03 DIAGNOSIS — R600 Localized edema: Secondary | ICD-10-CM | POA: Diagnosis not present

## 2015-02-03 DIAGNOSIS — K7290 Hepatic failure, unspecified without coma: Secondary | ICD-10-CM | POA: Diagnosis not present

## 2015-02-03 DIAGNOSIS — I10 Essential (primary) hypertension: Secondary | ICD-10-CM | POA: Diagnosis not present

## 2015-02-10 DIAGNOSIS — K7460 Unspecified cirrhosis of liver: Secondary | ICD-10-CM | POA: Diagnosis not present

## 2015-02-10 DIAGNOSIS — G47411 Narcolepsy with cataplexy: Secondary | ICD-10-CM | POA: Diagnosis not present

## 2015-02-10 DIAGNOSIS — K7290 Hepatic failure, unspecified without coma: Secondary | ICD-10-CM | POA: Diagnosis not present

## 2015-02-10 DIAGNOSIS — I10 Essential (primary) hypertension: Secondary | ICD-10-CM | POA: Diagnosis not present

## 2015-04-15 DIAGNOSIS — K7290 Hepatic failure, unspecified without coma: Secondary | ICD-10-CM | POA: Diagnosis not present

## 2015-04-15 DIAGNOSIS — R7989 Other specified abnormal findings of blood chemistry: Secondary | ICD-10-CM | POA: Diagnosis not present

## 2015-04-15 DIAGNOSIS — R410 Disorientation, unspecified: Secondary | ICD-10-CM | POA: Diagnosis not present

## 2015-04-23 DIAGNOSIS — I10 Essential (primary) hypertension: Secondary | ICD-10-CM | POA: Diagnosis not present

## 2015-04-23 DIAGNOSIS — K7460 Unspecified cirrhosis of liver: Secondary | ICD-10-CM | POA: Diagnosis not present

## 2015-04-23 DIAGNOSIS — K7290 Hepatic failure, unspecified without coma: Secondary | ICD-10-CM | POA: Diagnosis not present

## 2015-05-01 DIAGNOSIS — I10 Essential (primary) hypertension: Secondary | ICD-10-CM | POA: Diagnosis not present

## 2015-05-01 DIAGNOSIS — R7989 Other specified abnormal findings of blood chemistry: Secondary | ICD-10-CM | POA: Diagnosis not present

## 2015-05-01 DIAGNOSIS — G47411 Narcolepsy with cataplexy: Secondary | ICD-10-CM | POA: Diagnosis not present

## 2015-05-01 DIAGNOSIS — K7290 Hepatic failure, unspecified without coma: Secondary | ICD-10-CM | POA: Diagnosis not present

## 2015-05-29 DIAGNOSIS — I890 Lymphedema, not elsewhere classified: Secondary | ICD-10-CM | POA: Diagnosis not present

## 2015-05-29 DIAGNOSIS — L03116 Cellulitis of left lower limb: Secondary | ICD-10-CM | POA: Diagnosis not present

## 2015-06-03 DIAGNOSIS — I890 Lymphedema, not elsewhere classified: Secondary | ICD-10-CM | POA: Diagnosis not present

## 2015-06-03 DIAGNOSIS — L03116 Cellulitis of left lower limb: Secondary | ICD-10-CM | POA: Diagnosis not present

## 2015-06-05 DIAGNOSIS — L03116 Cellulitis of left lower limb: Secondary | ICD-10-CM | POA: Diagnosis not present

## 2015-07-10 DIAGNOSIS — B182 Chronic viral hepatitis C: Secondary | ICD-10-CM | POA: Diagnosis not present

## 2015-07-10 DIAGNOSIS — G47411 Narcolepsy with cataplexy: Secondary | ICD-10-CM | POA: Diagnosis not present

## 2015-07-10 DIAGNOSIS — I10 Essential (primary) hypertension: Secondary | ICD-10-CM | POA: Diagnosis not present

## 2015-07-10 DIAGNOSIS — Z23 Encounter for immunization: Secondary | ICD-10-CM | POA: Diagnosis not present

## 2015-07-10 DIAGNOSIS — R7989 Other specified abnormal findings of blood chemistry: Secondary | ICD-10-CM | POA: Diagnosis not present

## 2015-09-15 DIAGNOSIS — L97909 Non-pressure chronic ulcer of unspecified part of unspecified lower leg with unspecified severity: Secondary | ICD-10-CM | POA: Diagnosis not present

## 2015-10-02 DIAGNOSIS — R7989 Other specified abnormal findings of blood chemistry: Secondary | ICD-10-CM | POA: Diagnosis not present

## 2015-10-02 DIAGNOSIS — B182 Chronic viral hepatitis C: Secondary | ICD-10-CM | POA: Diagnosis not present

## 2015-10-02 DIAGNOSIS — G47411 Narcolepsy with cataplexy: Secondary | ICD-10-CM | POA: Diagnosis not present

## 2015-10-02 DIAGNOSIS — I10 Essential (primary) hypertension: Secondary | ICD-10-CM | POA: Diagnosis not present

## 2015-10-09 DIAGNOSIS — K7290 Hepatic failure, unspecified without coma: Secondary | ICD-10-CM | POA: Diagnosis not present

## 2015-10-09 DIAGNOSIS — K7460 Unspecified cirrhosis of liver: Secondary | ICD-10-CM | POA: Diagnosis not present

## 2015-10-09 DIAGNOSIS — R7989 Other specified abnormal findings of blood chemistry: Secondary | ICD-10-CM | POA: Diagnosis not present

## 2015-10-09 DIAGNOSIS — I10 Essential (primary) hypertension: Secondary | ICD-10-CM | POA: Diagnosis not present

## 2015-12-29 DIAGNOSIS — K7460 Unspecified cirrhosis of liver: Secondary | ICD-10-CM | POA: Diagnosis not present

## 2015-12-29 DIAGNOSIS — R7989 Other specified abnormal findings of blood chemistry: Secondary | ICD-10-CM | POA: Diagnosis not present

## 2015-12-29 DIAGNOSIS — E162 Hypoglycemia, unspecified: Secondary | ICD-10-CM | POA: Diagnosis not present

## 2015-12-29 DIAGNOSIS — I10 Essential (primary) hypertension: Secondary | ICD-10-CM | POA: Diagnosis not present

## 2015-12-29 DIAGNOSIS — K7290 Hepatic failure, unspecified without coma: Secondary | ICD-10-CM | POA: Diagnosis not present

## 2016-01-15 DIAGNOSIS — I10 Essential (primary) hypertension: Secondary | ICD-10-CM | POA: Diagnosis not present

## 2016-01-15 DIAGNOSIS — K7460 Unspecified cirrhosis of liver: Secondary | ICD-10-CM | POA: Diagnosis not present

## 2016-01-15 DIAGNOSIS — R600 Localized edema: Secondary | ICD-10-CM | POA: Diagnosis not present

## 2016-01-15 DIAGNOSIS — R7989 Other specified abnormal findings of blood chemistry: Secondary | ICD-10-CM | POA: Diagnosis not present

## 2016-01-27 DIAGNOSIS — H401231 Low-tension glaucoma, bilateral, mild stage: Secondary | ICD-10-CM | POA: Diagnosis not present

## 2016-01-27 DIAGNOSIS — H532 Diplopia: Secondary | ICD-10-CM | POA: Diagnosis not present

## 2016-01-27 DIAGNOSIS — Z961 Presence of intraocular lens: Secondary | ICD-10-CM | POA: Diagnosis not present

## 2016-01-27 DIAGNOSIS — H35033 Hypertensive retinopathy, bilateral: Secondary | ICD-10-CM | POA: Diagnosis not present

## 2016-01-31 ENCOUNTER — Encounter (HOSPITAL_COMMUNITY): Payer: Self-pay | Admitting: *Deleted

## 2016-01-31 ENCOUNTER — Emergency Department (HOSPITAL_COMMUNITY): Payer: Medicare Other

## 2016-01-31 ENCOUNTER — Emergency Department (HOSPITAL_COMMUNITY)
Admission: EM | Admit: 2016-01-31 | Discharge: 2016-01-31 | Disposition: A | Discharge status: Home / Self Care | Payer: Medicare Other | Attending: Emergency Medicine | Admitting: Emergency Medicine

## 2016-01-31 DIAGNOSIS — F17200 Nicotine dependence, unspecified, uncomplicated: Secondary | ICD-10-CM | POA: Diagnosis not present

## 2016-01-31 DIAGNOSIS — Z862 Personal history of diseases of the blood and blood-forming organs and certain disorders involving the immune mechanism: Secondary | ICD-10-CM | POA: Diagnosis not present

## 2016-01-31 DIAGNOSIS — S6992XA Unspecified injury of left wrist, hand and finger(s), initial encounter: Secondary | ICD-10-CM | POA: Diagnosis not present

## 2016-01-31 DIAGNOSIS — S63502A Unspecified sprain of left wrist, initial encounter: Secondary | ICD-10-CM | POA: Diagnosis not present

## 2016-01-31 DIAGNOSIS — Y9289 Other specified places as the place of occurrence of the external cause: Secondary | ICD-10-CM | POA: Diagnosis not present

## 2016-01-31 DIAGNOSIS — I10 Essential (primary) hypertension: Secondary | ICD-10-CM | POA: Insufficient documentation

## 2016-01-31 DIAGNOSIS — Z8619 Personal history of other infectious and parasitic diseases: Secondary | ICD-10-CM | POA: Diagnosis not present

## 2016-01-31 DIAGNOSIS — W208XXA Other cause of strike by thrown, projected or falling object, initial encounter: Secondary | ICD-10-CM | POA: Insufficient documentation

## 2016-01-31 DIAGNOSIS — Z972 Presence of dental prosthetic device (complete) (partial): Secondary | ICD-10-CM | POA: Insufficient documentation

## 2016-01-31 DIAGNOSIS — M1990 Unspecified osteoarthritis, unspecified site: Secondary | ICD-10-CM | POA: Diagnosis not present

## 2016-01-31 DIAGNOSIS — Y998 Other external cause status: Secondary | ICD-10-CM | POA: Insufficient documentation

## 2016-01-31 DIAGNOSIS — S6392XA Sprain of unspecified part of left wrist and hand, initial encounter: Secondary | ICD-10-CM | POA: Diagnosis not present

## 2016-01-31 DIAGNOSIS — Z8719 Personal history of other diseases of the digestive system: Secondary | ICD-10-CM | POA: Insufficient documentation

## 2016-01-31 DIAGNOSIS — Z79899 Other long term (current) drug therapy: Secondary | ICD-10-CM | POA: Insufficient documentation

## 2016-01-31 DIAGNOSIS — Y9389 Activity, other specified: Secondary | ICD-10-CM | POA: Insufficient documentation

## 2016-01-31 DIAGNOSIS — Z973 Presence of spectacles and contact lenses: Secondary | ICD-10-CM | POA: Diagnosis not present

## 2016-01-31 DIAGNOSIS — M25532 Pain in left wrist: Secondary | ICD-10-CM | POA: Diagnosis not present

## 2016-01-31 MED ORDER — TRAMADOL HCL 50 MG PO TABS: 50.0000 mg | ORAL_TABLET | Freq: Four times a day (QID) | ORAL | Status: DC | PRN

## 2016-01-31 NOTE — ED Notes (Signed)
Applied velcro left wrist/forearm splint.  Patient confirmed placement was comfortable. Patient described the appropriate procedure for application and removal of splint.

## 2016-01-31 NOTE — ED Notes (Signed)
Patient stated he was unloading topsoil from the wheelbarrow and it flipped and made him fall hurting his left wrist.  Denies any other injuries.  Patient alert and oriented  Does not take blood thinners

## 2016-01-31 NOTE — ED Provider Notes (Signed)
CSN: [id number]     Arrival date & time 01/31/16  2039 History   First MD Initiated Contact with Patient 01/31/16 2120     Chief Complaint  Patient presents with  . Wrist Injury     (Consider location/radiation/quality/duration/timing/severity/associated sxs/prior Treatment) HPI   Joshua [name] is a 70 y.o. male, with a history of cirrhosis of liver, anemia, arthritis, and hepatitis C, presenting to the ED with a left wrist injury that occurred earlier today. Patient was moving a loaded wheelbarrow down a hill, lost his balance, and fell, landing on his left wrist. Pt rates his pain at 7/10, throbbing, nonradiating. Pt has not taken anything for pain PTA. Pt denies LOC, head trauma, neuro deficits, or any other pain, injuries, or complaints.    Past Medical History  Diagnosis Date  . Arthritis   . Hepatitis C   . Hypertension   . Wears glasses   . Wears partial dentures     top partial  . Anemia     low platelets/liver disease  . Cirrhosis [facility])    Past Surgical History  Procedure Laterality Date  . Tonsillectomy    . Appendectomy    . Eye surgery      both cataracts  . Pilonidal cyst excision  1960  . Colonoscopy    . Upper gi endoscopy    . Shoulder arthroscopy with subacromial decompression, rotator cuff repair and bicep tendon repair Left 08/09/2013    Procedure: LEFT SHOULDER ARTHROSCOPY WITH SUBACROMIAL DECOMPRESSION, DISTAL CLAVICULECTOMY;  Surgeon: Ninetta Lights, MD;  Location: [place];  Service: Orthopedics;  Laterality: Left;   Family History  Problem Relation Age of Onset  . Cirrhosis Mother   . Cirrhosis Father   . CAD Neg Hx    Social History  Substance Use Topics  . Smoking status: Light Tobacco Smoker  . Smokeless tobacco: Current User  . Alcohol Use: Yes    Review of Systems  Respiratory: Negative for shortness of breath.   Cardiovascular: Negative for chest pain.  Musculoskeletal: Positive for arthralgias (left wrist).  Negative for back pain and neck pain.  Neurological: Negative for dizziness, weakness, light-headedness, numbness and headaches.      Allergies  Codeine  Home Medications   Prior to Admission medications   Medication Sig Start Date End Date Taking? Authorizing Provider  amphetamine-dextroamphetamine (ADDERALL) 10 MG tablet Take 20 mg by mouth daily.  06/10/12   Historical Provider, MD  Docusate Calcium (STOOL SOFTENER PO) Take 1 tablet by mouth daily as needed (constipation).     Historical Provider, MD  furosemide (LASIX) 80 MG tablet Take 0.5 tablets (40 mg total) by mouth daily. 05/24/14   [place]in Karlyne Greenspan, MD  lactulose (CHRONULAC) 10 GM/15ML solution Take 30 g by mouth 3 (three) times daily. 05/24/14   [place], MD  Multiple Vitamins-Minerals (MULTIVITAMIN WITH MINERALS) tablet Take 1 tablet by mouth daily.    Historical Provider, MD  nadolol (CORGARD) 40 MG tablet Take 40 mg by mouth every evening.  01/15/14   Historical Provider, MD  Polyethyl Glycol-Propyl Glycol (SYSTANE OP) Place 1 drop into both eyes at bedtime.    Historical Provider, MD  spironolactone (ALDACTONE) 100 MG tablet Take 1 tablet (100 mg total) by mouth 2 (two) times daily. 05/24/14   Costin Karlyne Greenspan, MD  traMADol (ULTRAM) 50 MG tablet Take 1 tablet (50 mg total) by mouth every 6 (six) hours as needed. 01/31/16   Lorayne Bender, PA-C  BP 122/66 mmHg  Pulse 56  Temp(Src) 97.7 F (36.5 C) (Oral)  Resp 18  Ht 5\' 6"  (1.676 m)  Wt 77.111 kg  BMI 27.45 kg/m2  SpO2 100% Physical Exam  Constitutional: He appears well-developed and well-nourished. No distress.  HENT:  Head: Normocephalic and atraumatic.  Eyes: Conjunctivae are normal.  Cardiovascular: Normal rate and regular rhythm.   Pulmonary/Chest: Effort normal.  Musculoskeletal: He exhibits edema and tenderness.  Swelling and tenderness to the right wrist. ROM intact in right fingers and right elbow. Circulation intact distally.  Neurological: He is  alert.  No sensory deficits. Strength 5/5.  Skin: Skin is warm and dry. He is not diaphoretic.  Nursing note and vitals reviewed.   ED Course  Procedures (including critical care time) Labs Review Labs Reviewed - No data to display  Imaging Review Dg Wrist Complete Left  01/31/2016  CLINICAL DATA:  Left wrist pain after trauma earlier today. EXAM: LEFT WRIST - COMPLETE 3+ VIEW COMPARISON:  None. FINDINGS: Negative for acute fracture, dislocation or radiopaque foreign body. There is an old fragment at the ulnar styloid. There is chondrocalcinosis, seen best at the triangular fibrocartilage. Mild arthritic changes are present throughout the wrist, with more severe arthritic changes at the first carpometacarpal articulation. There is no bone lesion or bony destruction. IMPRESSION: Chronic findings including chondrocalcinosis, arthritic changes, and an unfused ulnar styloid fragment. No acute findings. Electronically Signed   By: Andreas Newport M.D.   On: 01/31/2016 21:29   I have personally reviewed and evaluated these images as part of my medical decision-making.   EKG Interpretation None      MDM   Final diagnoses:  Wrist sprain, left, initial encounter    Joshua [name] presents with a left wrist injury that occurred earlier today.  Findings and plan of care discussed with Blanchie Dessert, MD.  Patient's presentation is consistent with a wrist fracture vs a sprain. Xrays show no acute abnormalities. Pt placed in wrist splint, given pain management prescriptions, and advised to follow up with PCP, if needed. RICE protocol reviewed. Return precautions discussed. Pt voiced understanding of these instructions and is comfortable with discharge.   Filed Vitals:   01/31/16 2104 01/31/16 2210  BP: 122/66 97/54  Pulse: 56 87  Temp: 97.7 F (36.5 C)   TempSrc: Oral   Resp: 18   Height: 5\' 6"  (1.676 m)   Weight: 77.111 kg   SpO2: 100% 99%       Lorayne Bender, PA-C 02/01/16  [place], MD 02/01/16 1515

## 2016-01-31 NOTE — ED Notes (Signed)
Patient left at this time with all belongings. 

## 2016-01-31 NOTE — Discharge Instructions (Signed)
You have been seen today for a wrist injury. Your imaging showed no abnormalities. Suspect that your injury is a wrist sprain. Rest, elevation, ice, and anti-inflammatory medications, such as naproxen or ibuprofen are the indicated treatments. Follow up with PCP as needed. Return to ED should symptoms worsen.

## 2016-04-19 ENCOUNTER — Other Ambulatory Visit: Payer: Self-pay | Admitting: Dermatology

## 2016-04-19 DIAGNOSIS — C44519 Basal cell carcinoma of skin of other part of trunk: Secondary | ICD-10-CM | POA: Diagnosis not present

## 2016-04-19 DIAGNOSIS — C44611 Basal cell carcinoma of skin of unspecified upper limb, including shoulder: Secondary | ICD-10-CM | POA: Diagnosis not present

## 2016-04-19 DIAGNOSIS — C44319 Basal cell carcinoma of skin of other parts of face: Secondary | ICD-10-CM | POA: Diagnosis not present

## 2016-04-19 DIAGNOSIS — I872 Venous insufficiency (chronic) (peripheral): Secondary | ICD-10-CM | POA: Diagnosis not present

## 2016-05-20 DIAGNOSIS — I10 Essential (primary) hypertension: Secondary | ICD-10-CM | POA: Diagnosis not present

## 2016-05-20 DIAGNOSIS — K7460 Unspecified cirrhosis of liver: Secondary | ICD-10-CM | POA: Diagnosis not present

## 2016-05-20 DIAGNOSIS — R7989 Other specified abnormal findings of blood chemistry: Secondary | ICD-10-CM | POA: Diagnosis not present

## 2016-06-03 ENCOUNTER — Other Ambulatory Visit: Payer: Self-pay | Admitting: Dermatology

## 2016-06-03 DIAGNOSIS — K7460 Unspecified cirrhosis of liver: Secondary | ICD-10-CM | POA: Diagnosis not present

## 2016-06-03 DIAGNOSIS — L988 Other specified disorders of the skin and subcutaneous tissue: Secondary | ICD-10-CM | POA: Diagnosis not present

## 2016-06-03 DIAGNOSIS — C44319 Basal cell carcinoma of skin of other parts of face: Secondary | ICD-10-CM | POA: Diagnosis not present

## 2016-06-03 DIAGNOSIS — E162 Hypoglycemia, unspecified: Secondary | ICD-10-CM | POA: Diagnosis not present

## 2016-06-03 DIAGNOSIS — R7989 Other specified abnormal findings of blood chemistry: Secondary | ICD-10-CM | POA: Diagnosis not present

## 2016-06-03 DIAGNOSIS — F5101 Primary insomnia: Secondary | ICD-10-CM | POA: Diagnosis not present

## 2016-06-10 DIAGNOSIS — Z029 Encounter for administrative examinations, unspecified: Secondary | ICD-10-CM | POA: Diagnosis not present

## 2016-08-04 DIAGNOSIS — H401231 Low-tension glaucoma, bilateral, mild stage: Secondary | ICD-10-CM | POA: Diagnosis not present

## 2016-08-04 DIAGNOSIS — H532 Diplopia: Secondary | ICD-10-CM | POA: Diagnosis not present

## 2016-08-12 DIAGNOSIS — L97909 Non-pressure chronic ulcer of unspecified part of unspecified lower leg with unspecified severity: Secondary | ICD-10-CM | POA: Diagnosis not present

## 2016-08-12 DIAGNOSIS — C44519 Basal cell carcinoma of skin of other part of trunk: Secondary | ICD-10-CM | POA: Diagnosis not present

## 2016-08-27 DIAGNOSIS — R7989 Other specified abnormal findings of blood chemistry: Secondary | ICD-10-CM | POA: Diagnosis not present

## 2016-09-08 DIAGNOSIS — H5021 Vertical strabismus, right eye: Secondary | ICD-10-CM | POA: Diagnosis not present

## 2016-09-27 DIAGNOSIS — R7989 Other specified abnormal findings of blood chemistry: Secondary | ICD-10-CM | POA: Diagnosis not present

## 2016-09-27 DIAGNOSIS — E162 Hypoglycemia, unspecified: Secondary | ICD-10-CM | POA: Diagnosis not present

## 2016-10-07 DIAGNOSIS — R7989 Other specified abnormal findings of blood chemistry: Secondary | ICD-10-CM | POA: Diagnosis not present

## 2016-10-07 DIAGNOSIS — K7290 Hepatic failure, unspecified without coma: Secondary | ICD-10-CM | POA: Diagnosis not present

## 2016-10-07 DIAGNOSIS — R600 Localized edema: Secondary | ICD-10-CM | POA: Diagnosis not present

## 2016-10-07 DIAGNOSIS — K7460 Unspecified cirrhosis of liver: Secondary | ICD-10-CM | POA: Diagnosis not present

## 2016-11-03 DIAGNOSIS — Z23 Encounter for immunization: Secondary | ICD-10-CM | POA: Diagnosis not present

## 2016-11-30 DIAGNOSIS — H5021 Vertical strabismus, right eye: Secondary | ICD-10-CM | POA: Diagnosis not present

## 2017-01-05 DIAGNOSIS — G47411 Narcolepsy with cataplexy: Secondary | ICD-10-CM | POA: Diagnosis not present

## 2017-01-05 DIAGNOSIS — R7989 Other specified abnormal findings of blood chemistry: Secondary | ICD-10-CM | POA: Diagnosis not present

## 2017-01-05 DIAGNOSIS — K7290 Hepatic failure, unspecified without coma: Secondary | ICD-10-CM | POA: Diagnosis not present

## 2017-01-05 DIAGNOSIS — K7460 Unspecified cirrhosis of liver: Secondary | ICD-10-CM | POA: Diagnosis not present

## 2017-02-08 ENCOUNTER — Other Ambulatory Visit: Payer: Self-pay | Admitting: Dermatology

## 2017-02-08 DIAGNOSIS — L821 Other seborrheic keratosis: Secondary | ICD-10-CM | POA: Diagnosis not present

## 2017-02-08 DIAGNOSIS — L570 Actinic keratosis: Secondary | ICD-10-CM | POA: Diagnosis not present

## 2017-02-08 DIAGNOSIS — D3141 Benign neoplasm of right ciliary body: Secondary | ICD-10-CM | POA: Diagnosis not present

## 2017-02-08 DIAGNOSIS — H35033 Hypertensive retinopathy, bilateral: Secondary | ICD-10-CM | POA: Diagnosis not present

## 2017-02-08 DIAGNOSIS — L910 Hypertrophic scar: Secondary | ICD-10-CM | POA: Diagnosis not present

## 2017-02-08 DIAGNOSIS — H401231 Low-tension glaucoma, bilateral, mild stage: Secondary | ICD-10-CM | POA: Diagnosis not present

## 2017-02-08 DIAGNOSIS — D492 Neoplasm of unspecified behavior of bone, soft tissue, and skin: Secondary | ICD-10-CM | POA: Diagnosis not present

## 2017-02-08 DIAGNOSIS — I7090 Unspecified atherosclerosis: Secondary | ICD-10-CM | POA: Diagnosis not present

## 2017-02-08 DIAGNOSIS — D229 Melanocytic nevi, unspecified: Secondary | ICD-10-CM | POA: Diagnosis not present

## 2017-04-06 DIAGNOSIS — I10 Essential (primary) hypertension: Secondary | ICD-10-CM | POA: Diagnosis not present

## 2017-04-06 DIAGNOSIS — Z125 Encounter for screening for malignant neoplasm of prostate: Secondary | ICD-10-CM | POA: Diagnosis not present

## 2017-04-06 DIAGNOSIS — Z5181 Encounter for therapeutic drug level monitoring: Secondary | ICD-10-CM | POA: Diagnosis not present

## 2017-04-06 DIAGNOSIS — R7989 Other specified abnormal findings of blood chemistry: Secondary | ICD-10-CM | POA: Diagnosis not present

## 2017-04-06 DIAGNOSIS — Z0000 Encounter for general adult medical examination without abnormal findings: Secondary | ICD-10-CM | POA: Diagnosis not present

## 2017-04-12 DIAGNOSIS — R749 Abnormal serum enzyme level, unspecified: Secondary | ICD-10-CM | POA: Diagnosis not present

## 2017-04-12 DIAGNOSIS — R7989 Other specified abnormal findings of blood chemistry: Secondary | ICD-10-CM | POA: Diagnosis not present

## 2017-04-14 DIAGNOSIS — R002 Palpitations: Secondary | ICD-10-CM | POA: Diagnosis not present

## 2017-04-14 DIAGNOSIS — R7989 Other specified abnormal findings of blood chemistry: Secondary | ICD-10-CM | POA: Diagnosis not present

## 2017-04-14 DIAGNOSIS — K7290 Hepatic failure, unspecified without coma: Secondary | ICD-10-CM | POA: Diagnosis not present

## 2017-04-14 DIAGNOSIS — K7460 Unspecified cirrhosis of liver: Secondary | ICD-10-CM | POA: Diagnosis not present

## 2017-04-14 DIAGNOSIS — Z23 Encounter for immunization: Secondary | ICD-10-CM | POA: Diagnosis not present

## 2017-04-14 DIAGNOSIS — R600 Localized edema: Secondary | ICD-10-CM | POA: Diagnosis not present

## 2017-04-14 DIAGNOSIS — Z5181 Encounter for therapeutic drug level monitoring: Secondary | ICD-10-CM | POA: Diagnosis not present

## 2017-04-22 DIAGNOSIS — M545 Low back pain: Secondary | ICD-10-CM | POA: Diagnosis not present

## 2017-04-26 DIAGNOSIS — S335XXA Sprain of ligaments of lumbar spine, initial encounter: Secondary | ICD-10-CM | POA: Diagnosis not present

## 2017-05-12 DIAGNOSIS — B1920 Unspecified viral hepatitis C without hepatic coma: Secondary | ICD-10-CM | POA: Diagnosis not present

## 2017-05-12 DIAGNOSIS — J309 Allergic rhinitis, unspecified: Secondary | ICD-10-CM | POA: Diagnosis not present

## 2017-05-12 DIAGNOSIS — R0781 Pleurodynia: Secondary | ICD-10-CM | POA: Diagnosis not present

## 2017-05-12 DIAGNOSIS — S2241XA Multiple fractures of ribs, right side, initial encounter for closed fracture: Secondary | ICD-10-CM | POA: Diagnosis not present

## 2017-05-17 DIAGNOSIS — S2231XA Fracture of one rib, right side, initial encounter for closed fracture: Secondary | ICD-10-CM | POA: Diagnosis not present

## 2017-05-26 DIAGNOSIS — Z5181 Encounter for therapeutic drug level monitoring: Secondary | ICD-10-CM | POA: Diagnosis not present

## 2017-05-26 DIAGNOSIS — G47411 Narcolepsy with cataplexy: Secondary | ICD-10-CM | POA: Diagnosis not present

## 2017-06-02 DIAGNOSIS — G47411 Narcolepsy with cataplexy: Secondary | ICD-10-CM | POA: Diagnosis not present

## 2017-06-02 DIAGNOSIS — K7290 Hepatic failure, unspecified without coma: Secondary | ICD-10-CM | POA: Diagnosis not present

## 2017-06-02 DIAGNOSIS — R1310 Dysphagia, unspecified: Secondary | ICD-10-CM | POA: Diagnosis not present

## 2017-06-02 DIAGNOSIS — Z5181 Encounter for therapeutic drug level monitoring: Secondary | ICD-10-CM | POA: Diagnosis not present

## 2017-06-29 DIAGNOSIS — L03116 Cellulitis of left lower limb: Secondary | ICD-10-CM | POA: Diagnosis not present

## 2017-06-29 DIAGNOSIS — S81801S Unspecified open wound, right lower leg, sequela: Secondary | ICD-10-CM | POA: Diagnosis not present

## 2017-06-29 DIAGNOSIS — I890 Lymphedema, not elsewhere classified: Secondary | ICD-10-CM | POA: Diagnosis not present

## 2017-07-18 ENCOUNTER — Emergency Department (HOSPITAL_COMMUNITY): Payer: Medicare Other

## 2017-07-18 ENCOUNTER — Inpatient Hospital Stay (HOSPITAL_COMMUNITY)
Admission: EM | Admit: 2017-07-18 | Discharge: 2017-07-21 | DRG: 442 | Disposition: A | Discharge status: Home / Self Care | Payer: Medicare Other | Attending: Family Medicine | Admitting: Family Medicine

## 2017-07-18 ENCOUNTER — Encounter (HOSPITAL_COMMUNITY): Payer: Self-pay | Admitting: Emergency Medicine

## 2017-07-18 DIAGNOSIS — I8510 Secondary esophageal varices without bleeding: Secondary | ICD-10-CM | POA: Diagnosis present

## 2017-07-18 DIAGNOSIS — R918 Other nonspecific abnormal finding of lung field: Secondary | ICD-10-CM | POA: Diagnosis not present

## 2017-07-18 DIAGNOSIS — K7290 Hepatic failure, unspecified without coma: Secondary | ICD-10-CM | POA: Diagnosis not present

## 2017-07-18 DIAGNOSIS — D6959 Other secondary thrombocytopenia: Secondary | ICD-10-CM | POA: Diagnosis present

## 2017-07-18 DIAGNOSIS — E8770 Fluid overload, unspecified: Secondary | ICD-10-CM | POA: Diagnosis present

## 2017-07-18 DIAGNOSIS — S81812A Laceration without foreign body, left lower leg, initial encounter: Secondary | ICD-10-CM | POA: Diagnosis present

## 2017-07-18 DIAGNOSIS — L0390 Cellulitis, unspecified: Secondary | ICD-10-CM | POA: Diagnosis present

## 2017-07-18 DIAGNOSIS — K7200 Acute and subacute hepatic failure without coma: Secondary | ICD-10-CM | POA: Diagnosis not present

## 2017-07-18 DIAGNOSIS — F17200 Nicotine dependence, unspecified, uncomplicated: Secondary | ICD-10-CM | POA: Diagnosis present

## 2017-07-18 DIAGNOSIS — E871 Hypo-osmolality and hyponatremia: Secondary | ICD-10-CM | POA: Diagnosis present

## 2017-07-18 DIAGNOSIS — I10 Essential (primary) hypertension: Secondary | ICD-10-CM | POA: Diagnosis not present

## 2017-07-18 DIAGNOSIS — I872 Venous insufficiency (chronic) (peripheral): Secondary | ICD-10-CM | POA: Diagnosis present

## 2017-07-18 DIAGNOSIS — Y92002 Bathroom of unspecified non-institutional (private) residence single-family (private) house as the place of occurrence of the external cause: Secondary | ICD-10-CM | POA: Diagnosis not present

## 2017-07-18 DIAGNOSIS — J9811 Atelectasis: Secondary | ICD-10-CM | POA: Diagnosis not present

## 2017-07-18 DIAGNOSIS — K7460 Unspecified cirrhosis of liver: Secondary | ICD-10-CM | POA: Diagnosis not present

## 2017-07-18 DIAGNOSIS — I8500 Esophageal varices without bleeding: Secondary | ICD-10-CM | POA: Diagnosis present

## 2017-07-18 DIAGNOSIS — R4182 Altered mental status, unspecified: Secondary | ICD-10-CM | POA: Diagnosis not present

## 2017-07-18 DIAGNOSIS — B182 Chronic viral hepatitis C: Secondary | ICD-10-CM | POA: Diagnosis present

## 2017-07-18 DIAGNOSIS — E7220 Disorder of urea cycle metabolism, unspecified: Secondary | ICD-10-CM | POA: Diagnosis present

## 2017-07-18 DIAGNOSIS — Y900 Blood alcohol level of less than 20 mg/100 ml: Secondary | ICD-10-CM | POA: Diagnosis present

## 2017-07-18 DIAGNOSIS — Z23 Encounter for immunization: Secondary | ICD-10-CM | POA: Diagnosis not present

## 2017-07-18 DIAGNOSIS — Z79899 Other long term (current) drug therapy: Secondary | ICD-10-CM

## 2017-07-18 DIAGNOSIS — R911 Solitary pulmonary nodule: Secondary | ICD-10-CM | POA: Diagnosis present

## 2017-07-18 DIAGNOSIS — F1010 Alcohol abuse, uncomplicated: Secondary | ICD-10-CM | POA: Diagnosis present

## 2017-07-18 DIAGNOSIS — Z885 Allergy status to narcotic agent status: Secondary | ICD-10-CM | POA: Diagnosis not present

## 2017-07-18 DIAGNOSIS — R402441 Other coma, without documented Glasgow coma scale score, or with partial score reported, in the field [EMT or ambulance]: Secondary | ICD-10-CM | POA: Diagnosis not present

## 2017-07-18 DIAGNOSIS — X58XXXA Exposure to other specified factors, initial encounter: Secondary | ICD-10-CM | POA: Diagnosis present

## 2017-07-18 DIAGNOSIS — B1920 Unspecified viral hepatitis C without hepatic coma: Secondary | ICD-10-CM | POA: Diagnosis present

## 2017-07-18 DIAGNOSIS — K7682 Hepatic encephalopathy: Secondary | ICD-10-CM | POA: Diagnosis present

## 2017-07-18 LAB — I-STAT CG4 LACTIC ACID, ED: LACTIC ACID, VENOUS: 1.83 mmol/L (ref 0.5–1.9)

## 2017-07-18 LAB — CBC WITH DIFFERENTIAL/PLATELET
BASOS PCT: 1 %
Basophils Absolute: 0.1 10*3/uL (ref 0.0–0.1)
EOS ABS: 0.1 10*3/uL (ref 0.0–0.7)
Eosinophils Relative: 1 %
HEMATOCRIT: 35.3 % — AB (ref 39.0–52.0)
Hemoglobin: 12.4 g/dL — ABNORMAL LOW (ref 13.0–17.0)
Lymphocytes Relative: 24 %
Lymphs Abs: 1.4 10*3/uL (ref 0.7–4.0)
MCH: 31.6 pg (ref 26.0–34.0)
MCHC: 35.1 g/dL (ref 30.0–36.0)
MCV: 89.8 fL (ref 78.0–100.0)
MONO ABS: 0.5 10*3/uL (ref 0.1–1.0)
MONOS PCT: 9 %
Neutro Abs: 3.8 10*3/uL (ref 1.7–7.7)
Neutrophils Relative %: 64 %
PLATELETS: 70 10*3/uL — AB (ref 150–400)
RBC: 3.93 MIL/uL — ABNORMAL LOW (ref 4.22–5.81)
RDW: 14.7 % (ref 11.5–15.5)
WBC: 5.9 10*3/uL (ref 4.0–10.5)

## 2017-07-18 LAB — COMPREHENSIVE METABOLIC PANEL
ALBUMIN: 3.1 g/dL — AB (ref 3.5–5.0)
ALK PHOS: 60 U/L (ref 38–126)
ALT: 25 U/L (ref 17–63)
AST: 33 U/L (ref 15–41)
Anion gap: 9 (ref 5–15)
BUN: 16 mg/dL (ref 6–20)
CALCIUM: 8.6 mg/dL — AB (ref 8.9–10.3)
CO2: 21 mmol/L — AB (ref 22–32)
CREATININE: 1 mg/dL (ref 0.61–1.24)
Chloride: 97 mmol/L — ABNORMAL LOW (ref 101–111)
GFR calc Af Amer: >60 mL/min (ref >60)
GFR calc non Af Amer: >60 mL/min (ref >60)
GLUCOSE: 96 mg/dL (ref 65–99)
Potassium: 4.2 mmol/L (ref 3.5–5.1)
SODIUM: 127 mmol/L — AB (ref 135–145)
Total Bilirubin: 2.6 mg/dL — ABNORMAL HIGH (ref 0.3–1.2)
Total Protein: 5.5 g/dL — ABNORMAL LOW (ref 6.5–8.1)

## 2017-07-18 LAB — URINALYSIS, ROUTINE W REFLEX MICROSCOPIC
Bilirubin Urine: NEGATIVE
Glucose, UA: NEGATIVE mg/dL
KETONES UR: NEGATIVE mg/dL
NITRITE: NEGATIVE
PH: 7 (ref 5.0–8.0)
PROTEIN: NEGATIVE mg/dL
Specific Gravity, Urine: 1.01 (ref 1.005–1.030)

## 2017-07-18 LAB — URINALYSIS, MICROSCOPIC (REFLEX)

## 2017-07-18 LAB — OSMOLALITY, URINE: OSMOLALITY UR: 425 mosm/kg (ref 300–900)

## 2017-07-18 LAB — RAPID URINE DRUG SCREEN, HOSP PERFORMED
Amphetamines: NOT DETECTED
Barbiturates: NOT DETECTED
Benzodiazepines: NOT DETECTED
Cocaine: NOT DETECTED
OPIATES: NOT DETECTED
TETRAHYDROCANNABINOL: NOT DETECTED

## 2017-07-18 LAB — I-STAT TROPONIN, ED: Troponin i, poc: 0.01 ng/mL (ref 0.00–0.08)

## 2017-07-18 LAB — AMMONIA: Ammonia: 66 umol/L — ABNORMAL HIGH (ref 9–35)

## 2017-07-18 LAB — SODIUM, URINE, RANDOM: Sodium, Ur: 13 mmol/L

## 2017-07-18 LAB — ETHANOL

## 2017-07-18 LAB — CREATININE, URINE, RANDOM: Creatinine, Urine: 85.21 mg/dL

## 2017-07-18 MED ORDER — LACTULOSE 10 GM/15ML PO SOLN
30.0000 g | Freq: Once | ORAL | Status: AC
Start: 2017-07-18 — End: 2017-07-18
  Administered 2017-07-18: 30 g via ORAL
  Filled 2017-07-18: qty 45

## 2017-07-18 NOTE — ED Provider Notes (Signed)
[facility] DEPT Provider Note   CSN: [id number] Arrival date & time: 07/18/17  1721     History   Chief Complaint Chief Complaint  Patient presents with  . Altered Mental Status    HPI Joshua [name] is a 71 y.o. male.  The history is provided by the patient and the EMS personnel. No language interpreter was used.   Joshua [name] is a 71 y.o. male who presents to the Emergency Department complaining of AMS.  Level V caveat due to AMS. He presents via EMS for evaluation of altered mental status.  History is provided by EMS. They state that he was last seen normal on Friday. His sister from [place] had called him today and noticed that he was confused and a welfare check was requested. When EMS arrived they had to forced entry into the home. The oven was on with a melted bag of bagels in the oven. The sink was on with an electric razor in it and it was overflowing of water. The patient was found in the bathroom with blood on the floor and a skin tear to his leg. Patient is unsure what has happened and denies any complaints. He does state that he drinks beer. He has a history of liver disease. He denies any SI. He lives alone. Past Medical History:  Diagnosis Date  . Anemia    low platelets/liver disease  . Arthritis   . Cirrhosis ([place])   . Hepatitis C   . Hypertension   . Wears glasses   . Wears partial dentures    top partial    Patient Active Problem List   Diagnosis Date Noted  . Acute hepatic encephalopathy 07/18/2017  . Hyponatremia 07/18/2017  . Pulmonary nodules 07/18/2017  . Hyperammonemia ([place]) 05/22/2014  . Acute encephalopathy 05/22/2014  . Renal failure (ARF), acute on chronic ([place]) 05/22/2014  . Cellulitis 02/17/2014  . Cirrhosis ([place]) 02/17/2014  . Hepatitis C 02/17/2014  . HTN (hypertension) 02/17/2014    Past Surgical History:  Procedure Laterality Date  . APPENDECTOMY    . COLONOSCOPY    . EYE SURGERY     both cataracts  . PILONIDAL CYST  EXCISION  1960  . SHOULDER ARTHROSCOPY WITH SUBACROMIAL DECOMPRESSION, ROTATOR CUFF REPAIR AND BICEP TENDON REPAIR Left 08/09/2013   Procedure: LEFT SHOULDER ARTHROSCOPY WITH SUBACROMIAL DECOMPRESSION, DISTAL CLAVICULECTOMY;  Surgeon: Ninetta Lights, MD;  Location: [place];  Service: Orthopedics;  Laterality: Left;  . TONSILLECTOMY    . UPPER GI ENDOSCOPY         Home Medications    Prior to Admission medications   Medication Sig Start Date End Date Taking? Authorizing Provider  amphetamine-dextroamphetamine (ADDERALL) 10 MG tablet Take 20 mg by mouth daily.  06/10/12  Yes [provider]  Docusate Calcium (STOOL SOFTENER PO) Take 1 tablet by mouth daily as needed (constipation).    Yes [provider]  furosemide (LASIX) 40 MG tablet Take 40 mg by mouth daily. 06/28/17  Yes [provider]  HYDROcodone-acetaminophen (NORCO) 10-325 MG tablet Take 0.5-1 tablets by mouth every 8 (eight) hours as needed. 05/20/17  Yes [provider]  lactulose (CHRONULAC) 10 GM/15ML solution Take 30 g by mouth 3 (three) times daily. 05/24/14  Yes Gherghe, Vella Redhead, MD  pantoprazole (PROTONIX) 40 MG tablet Take 40 mg by mouth daily. 06/28/17  Yes [provider]  SODIUM FLUORIDE, DENTAL RINSE, (PREVIDENT) 0.2 % SOLN Place onto teeth See admin instructions. Rinse with  small amount for one minute and then spit; do not eat, drink or rinse for 30 minutes after using   Yes [provider]  spironolactone (ALDACTONE) 100 MG tablet Take 1 tablet (100 mg total) by mouth 2 (two) times daily. Patient taking differently: Take 300 mg by mouth daily.  05/24/14  Yes Gherghe, Vella Redhead, MD  zolpidem (AMBIEN) 5 MG tablet Take 5 mg by mouth at bedtime. 06/07/17  Yes [provider]  furosemide (LASIX) 80 MG tablet Take 0.5 tablets (40 mg total) by mouth daily. Patient not taking: Reported on 07/18/2017 05/24/14   Caren Griffins, MD  Multiple  Vitamins-Minerals (MULTIVITAMIN WITH MINERALS) tablet Take 1 tablet by mouth daily.    [provider]  Polyethyl Glycol-Propyl Glycol (SYSTANE OP) Place 1 drop into both eyes at bedtime.    [provider]  traMADol (ULTRAM) 50 MG tablet Take 1 tablet (50 mg total) by mouth every 6 (six) hours as needed. Patient not taking: Reported on 07/18/2017 01/31/16   Lorayne Bender, PA-C    Family History Family History  Problem Relation Age of Onset  . Cirrhosis Mother   . Cirrhosis Father   . CAD Neg Hx     Social History Social History  Substance Use Topics  . Smoking status: Light Tobacco Smoker  . Smokeless tobacco: Current User  . Alcohol use Yes     Comment: "one beer a week"     Allergies   Codeine   Review of Systems Review of Systems  All other systems reviewed and are negative.    Physical Exam Updated Vital Signs BP 124/68   Pulse 73   Temp 98 F (36.7 C) (Oral)   Resp 14   Ht 5\' 6"  (1.676 m)   SpO2 99%   Physical Exam  Constitutional: He appears well-developed and well-nourished.  HENT:  Head: Normocephalic and atraumatic.  Cardiovascular: Normal rate and regular rhythm.   No murmur heard. Pulmonary/Chest: Effort normal and breath sounds normal. No respiratory distress.  Abdominal: Soft. There is no tenderness. There is no rebound and no guarding.  Musculoskeletal: He exhibits edema. He exhibits no tenderness.  3+ pitting edema to bilateral lower extremities with chronic venous stasis changes. There is a large skin tear to the left anterior shin. No active bleeding.  Neurological: He is alert.  Confused.  5 out of 5 strength in all 4 extremities. Disoriented to time.  Skin: Skin is warm and dry.  Psychiatric: He has a normal mood and affect. His behavior is normal.  Nursing note and vitals reviewed.    ED Treatments / Results  Labs (all labs ordered are listed, but only abnormal results are displayed) Labs Reviewed  COMPREHENSIVE  METABOLIC PANEL - Abnormal; Notable for the following:       Result Value   Sodium 127 (*)    Chloride 97 (*)    CO2 21 (*)    Calcium 8.6 (*)    Total Protein 5.5 (*)    Albumin 3.1 (*)    Total Bilirubin 2.6 (*)    All other components within normal limits  CBC WITH DIFFERENTIAL/PLATELET - Abnormal; Notable for the following:    RBC 3.93 (*)    Hemoglobin 12.4 (*)    HCT 35.3 (*)    Platelets 70 (*)    All other components within normal limits  URINALYSIS, ROUTINE W REFLEX MICROSCOPIC - Abnormal; Notable for the following:    Hgb urine dipstick SMALL (*)  Leukocytes, UA TRACE (*)    All other components within normal limits  AMMONIA - Abnormal; Notable for the following:    Ammonia 66 (*)    All other components within normal limits  URINALYSIS, MICROSCOPIC (REFLEX) - Abnormal; Notable for the following:    Bacteria, UA RARE (*)    Squamous Epithelial / LPF 0-5 (*)    All other components within normal limits  ETHANOL  RAPID URINE DRUG SCREEN, HOSP PERFORMED  I-STAT TROPONIN, ED  I-STAT CG4 LACTIC ACID, ED    EKG  EKG Interpretation  Date/Time:  Monday July 18 2017 17:29:31 EDT Ventricular Rate:  88 PR Interval:    QRS Duration: 97 QT Interval:  386 QTC Calculation: 467 R Axis:   58 Text Interpretation:  Age not entered, assumed to be  71 years old for purpose of ECG interpretation Sinus rhythm Prolonged PR interval Probable anteroseptal infarct, old Confirmed by Quintella Reichert [phone]) on 07/18/2017 10:31:08 PM       Radiology Dg Chest 2 View  Result Date: 07/18/2017 CLINICAL DATA:  Initial evaluation for acute altered mental status. EXAM: CHEST  2 VIEW COMPARISON:  Prior radiograph from 08/23/2014. FINDINGS: Cardiac and mediastinal silhouettes are stable in size and contour, and remain within normal limits. Lungs hypoinflated. Linear opacity at the left lung base most consistent with atelectasis and/or scar. Underlying emphysematous changes. No focal  infiltrates. No pulmonary edema or pleural effusion. No pneumothorax. There is question of 2 adjacent nodular densities overlying the peripheral right lung measuring up to approximately 2 cm each. While these are favored to reflect summation of overlying osseous shadows, underlying nodules not entirely excluded. These are not seen on lateral projection. No acute osseus abnormality.  Osteopenia. IMPRESSION: 1. Shallow lung inflation with mild left basilar atelectasis and/or scarring. No other active cardiopulmonary disease. 2. Two adjacent approximate 2 cm nodular densities overlying the peripheral right lower lung. While one and/or both of these are favored to reflect summation of overlying osseous shadows, underlying pulmonary nodules are not entirely excluded. Follow-up examination with dedicated cross-sectional imaging of the chest recommended for further characterization. 3. Underlying emphysema. Electronically Signed   By: Jeannine Boga M.D.   On: 07/18/2017 18:45   Ct Head Wo Contrast  Result Date: 07/18/2017 CLINICAL DATA:  Initial evaluation for acute altered mental status. EXAM: CT HEAD WITHOUT CONTRAST TECHNIQUE: Contiguous axial images were obtained from the base of the skull through the vertex without intravenous contrast. COMPARISON:  Prior CT from 08/23/2014. FINDINGS: Brain: Generalized age-related cerebral atrophy with chronic microvascular ischemic disease. Remote right frontal and occipital lobe infarcts noted. No acute intracranial hemorrhage. No evidence for acute large vessel territory infarct. No mass lesion, midline shift or mass effect. No hydrocephalus. No extra-axial fluid collection. Vascular: No hyperdense vessel. Scattered vascular calcifications noted within the carotid siphons. Skull: Scalp soft tissues and calvarium within normal limits. Sinuses/Orbits: Globes oral soft tissues normal. Patient status post lens extraction bilaterally. Mild scattered mucosal thickening within  the ethmoidal air cells. Paranasal sinuses otherwise clear. No mastoid effusion. Other: None. IMPRESSION: 1. No acute intracranial process. 2. Remote right frontal and occipital lobe infarcts. 3. Generalized cerebral atrophy with mild to moderate chronic small vessel ischemic disease. Electronically Signed   By: Jeannine Boga M.D.   On: 07/18/2017 18:38    Procedures Procedures (including critical care time)  Medications Ordered in ED Medications  lactulose (CHRONULAC) 10 GM/15ML solution 30 g (30 g Oral Given 07/18/17 2124)  Initial Impression / Assessment and Plan / ED Course  I have reviewed the triage vital signs and the nursing notes.  Pertinent labs & imaging results that were available during my care of the patient were reviewed by me and considered in my medical decision making (see chart for details).     Patient with history of liver disease comes from home with altered mental status. He does have a skin tear to his left leg, unclear how old this is.Marland Kitchen He is encephalopathic with no focal neurologic deficits. Concern for hepatic encephalopathy. Ammonia is  mildly elevated. Will provide lactulose. Hospitalist consulted for admission.  Final Clinical Impressions(s) / ED Diagnoses   Final diagnoses:  Altered mental status, unspecified altered mental status type    New Prescriptions New Prescriptions   No medications on file     Quintella Reichert, MD 07/18/17 2335

## 2017-07-18 NOTE — H&P (Addendum)
Joshua [name] DTO:[id number] DOB: [birth year] DOA: 07/18/2017     PCP: [name], MD   Outpatient Specialists:[name]  Patient coming from:   home Lives alone,    Chief Complaint: found confused  HPI: Joshua [name] is a 71 y.o. male with medical history significant of chronic liver disease Due to Hep C   Presented with confusion from home has cystic could get ahold of him and called 911 to have patient being assessed. He was found in his apartment that is same creatinine over was found on the bathroom naked on the toilet with blood on the floor and skin tear on left leg there was a over and turned on with melting plastic bag in it.  Emergency department patient is very confused unable to provide his own history When asked why he is here states ammonia Unsure if he is still drinking alcohol or not. Unable to provide history when that he injured his leg or if he has had any falls or head injuries. Reports drinking ocasional beer no fever. No blood in stool Regarding pertinent Chronic problems: Regarding hepatitis C patient has been treated at [facility] in 2006 but did not respond. In 2014 from 2015 he was treated with Harvoni He was screened for varices on 09/22/2010. This showed grade 2 esophageal varices, for which he was started on 40 mg of nadolol at bedtime and remains on this. He has never bled from varices. Not a candidate for transplant out of age range   IN ER:  Temp (24hrs), Avg:98 F (36.7 C), Min:98 F (36.7 C), Max:98 F (36.7 C)      on arrival  ED Triage Vitals  Enc Vitals Group     BP 07/18/17 1730 (!) 151/86     Pulse Rate 07/18/17 1730 90     Resp 07/18/17 1730 13     Temp 07/18/17 1730 98 F (36.7 C)     Temp Source 07/18/17 1730 Oral     SpO2 07/18/17 1721 99 %     Weight --      Height 07/18/17 1759 5\' 6"  (1.676 m)     Head Circumference --      Peak Flow --      Pain Score 07/18/17 1757 0     Pain Loc --      Pain Edu? --      Excl. in  GC? --     Latest Oxygen 1005 HR 88 Bp 155/69 Lactic acid 1.83 Trop 0.01 Na 127 K 4.2 cr 1.00 protein 5.5 alb 3.1 WBC 5.9 Hg 12.4 plt 70 EtOH <5 Ammonia 66 CXR emphysema and pulmonary nodules CT head: remote non-acute infarcts Following Medications were ordered in ER: Medications  lactulose (CHRONULAC) 10 GM/15ML solution 30 g (not administered)      Hospitalist was called for admission for Hepatic encephalopathy  Review of Systems:    Pertinent positives include: confusion  Constitutional:  No weight loss, night sweats, Fevers, chills, fatigue, weight loss  HEENT:  No headaches, Difficulty swallowing,Tooth/dental problems,Sore throat,  No sneezing, itching, ear ache, nasal congestion, post nasal drip,  Cardio-vascular:  No chest pain, Orthopnea, PND, anasarca, dizziness, palpitations.no Bilateral lower extremity swelling  GI:  No heartburn, indigestion, abdominal pain, nausea, vomiting, diarrhea, change in bowel habits, loss of appetite, melena, blood in stool, hematemesis Resp:  no shortness of breath at rest. No dyspnea on exertion, No excess mucus, no productive cough, No non-productive cough, No coughing up of  blood.No change in color of mucus.No wheezing. Skin:  no rash or lesions. No jaundice GU:  no dysuria, change in color of urine, no urgency or frequency. No straining to urinate.  No flank pain.  Musculoskeletal:  No joint pain or no joint swelling. No decreased range of motion. No back pain.  Psych:  No change in mood or affect. No depression or anxiety. No memory loss.  Neuro: no localizing neurological complaints, no tingling, no weakness, no double vision, no gait abnormality, no slurred speech,   As per HPI otherwise 10 point review of systems negative.   Past Medical History: Past Medical History:  Diagnosis Date  . Anemia    low platelets/liver disease  . Arthritis   . Cirrhosis ([place])   . Hepatitis C   . Hypertension   . Wears glasses   .  Wears partial dentures    top partial   Past Surgical History:  Procedure Laterality Date  . APPENDECTOMY    . COLONOSCOPY    . EYE SURGERY     both cataracts  . PILONIDAL CYST EXCISION  1960  . SHOULDER ARTHROSCOPY WITH SUBACROMIAL DECOMPRESSION, ROTATOR CUFF REPAIR AND BICEP TENDON REPAIR Left 08/09/2013   Procedure: LEFT SHOULDER ARTHROSCOPY WITH SUBACROMIAL DECOMPRESSION, DISTAL CLAVICULECTOMY;  Surgeon: Ninetta Lights, MD;  Location: [place];  Service: Orthopedics;  Laterality: Left;  . TONSILLECTOMY    . UPPER GI ENDOSCOPY       Social History:  Ambulatory independently      reports that he has been smoking.  He uses smokeless tobacco. He reports that he drinks alcohol. He reports that he uses drugs, including Marijuana.  Allergies:   Allergies  Allergen Reactions  . Codeine Itching       Family History:   Family History  Problem Relation Age of Onset  . Cirrhosis Mother   . Cirrhosis Father   . CAD Neg Hx     Medications: Prior to Admission medications   Medication Sig Start Date End Date Taking? Authorizing Provider  amphetamine-dextroamphetamine (ADDERALL) 10 MG tablet Take 20 mg by mouth daily.  06/10/12  Yes [provider]  furosemide (LASIX) 40 MG tablet Take 40 mg by mouth daily. 06/28/17  Yes [provider]  HYDROcodone-acetaminophen (NORCO) 10-325 MG tablet Take 0.5-1 tablets by mouth every 8 (eight) hours as needed. 05/20/17  Yes [provider]  lactulose (CHRONULAC) 10 GM/15ML solution Take 30 g by mouth 3 (three) times daily. 05/24/14  Yes Gherghe, Vella Redhead, MD  pantoprazole (PROTONIX) 40 MG tablet Take 40 mg by mouth daily. 06/28/17  Yes [provider]  SODIUM FLUORIDE, DENTAL RINSE, (PREVIDENT) 0.2 % SOLN Place onto teeth See admin instructions. Rinse with small amount for one minute and then spit; do not eat, drink or rinse for 30 minutes after using   Yes [provider]    spironolactone (ALDACTONE) 100 MG tablet Take 1 tablet (100 mg total) by mouth 2 (two) times daily. Patient taking differently: Take 300 mg by mouth daily.  05/24/14  Yes Gherghe, Vella Redhead, MD  zolpidem (AMBIEN) 5 MG tablet Take 5 mg by mouth at bedtime. 06/07/17  Yes [provider]  amoxicillin-clavulanate (AUGMENTIN) 875-125 MG tablet Take 1 tablet by mouth 2 (two) times daily. 10 day course filled 06/29/17 06/29/17   [provider]  Docusate Calcium (STOOL SOFTENER PO) Take 1 tablet by mouth daily as needed (constipation).     [provider]  furosemide (  LASIX) 80 MG tablet Take 0.5 tablets (40 mg total) by mouth daily. Patient not taking: Reported on 07/18/2017 05/24/14   Caren Griffins, MD  Multiple Vitamins-Minerals (MULTIVITAMIN WITH MINERALS) tablet Take 1 tablet by mouth daily.    [provider]  Polyethyl Glycol-Propyl Glycol (SYSTANE OP) Place 1 drop into both eyes at bedtime.    [provider]  traMADol (ULTRAM) 50 MG tablet Take 1 tablet (50 mg total) by mouth every 6 (six) hours as needed. Patient not taking: Reported on 07/18/2017 01/31/16   Lorayne Bender, PA-C    Physical Exam: Patient Vitals for the past 24 hrs:  BP Temp Temp src Pulse Resp SpO2 Height  07/18/17 2048 - - - 88 - 100 % -  07/18/17 2047 (!) 155/69 - - - - - -  07/18/17 1930 (!) 142/73 - - 86 19 100 % -  07/18/17 1830 (!) 125/41 - - 88 16 100 % -  07/18/17 1815 (!) 140/56 - - 83 14 100 % -  07/18/17 1800 (!) 151/68 - - 88 16 100 % -  07/18/17 1759 (!) 142/91 - - 84 15 100 % 5\' 6"  (1.676 m)  07/18/17 1745 (!) 142/91 - - 95 (!) 22 99 % -  07/18/17 1730 (!) 151/86 98 F (36.7 C) Oral 90 13 100 % -  07/18/17 1721 - - - - - 99 % -    1. General:  in No Acute distress  Chronically ill   -appearing 2. Psychological: Alert and Oriented to self and place 3. Head/ENT:    Dry Mucous Membranes                          Head Non traumatic, neck supple                             Poor Dentition 4. SKIN  decreased Skin turgor,  Skin clean Dry and intact rudeness and laceration on lower legs bilaterally,    chronic venous stasis changes 5. Heart: Regular rate and rhythm no Murmur, no Rub or gallop 6. Lungs:  no wheezes or crackles   7. Abdomen: Soft,  non-tender, Non distended 8. Lower extremities: no clubbing, cyanosis, 3+ edema 9. Neurologically Grossly intact, moving all 4 extremities equally  Asterixis present 10. MSK: Normal range of motion   body mass index is unknown because there is no height or weight on file.  Labs on Admission:   Labs on Admission: I have personally reviewed following labs and imaging studies  CBC:  Recent Labs Lab 07/18/17 1806  WBC 5.9  NEUTROABS 3.8  HGB 12.4*  HCT 35.3*  MCV 89.8  PLT 70*   Basic Metabolic Panel:  Recent Labs Lab 07/18/17 1806  NA 127*  K 4.2  CL 97*  CO2 21*  GLUCOSE 96  BUN 16  CREATININE 1.00  CALCIUM 8.6*   GFR: CrCl cannot be calculated (Unknown ideal weight.). Liver Function Tests:  Recent Labs Lab 07/18/17 1806  AST 33  ALT 25  ALKPHOS 60  BILITOT 2.6*  PROT 5.5*  ALBUMIN 3.1*   No results for input(s): LIPASE, AMYLASE in the last 168 hours.  Recent Labs Lab 07/18/17 1757  AMMONIA 66*   Coagulation Profile: No results for input(s): INR, PROTIME in the last 168 hours. Cardiac Enzymes: No results for input(s): CKTOTAL, CKMB, CKMBINDEX, TROPONINI in the last 168 hours.  BNP (last 3 results) No results for input(s): PROBNP in the last 8760 hours. HbA1C: No results for input(s): HGBA1C in the last 72 hours. CBG: No results for input(s): GLUCAP in the last 168 hours. Lipid Profile: No results for input(s): CHOL, HDL, LDLCALC, TRIG, CHOLHDL, LDLDIRECT in the last 72 hours. Thyroid Function Tests: No results for input(s): TSH, T4TOTAL, FREET4, T3FREE, THYROIDAB in the last 72 hours. Anemia Panel: No results for input(s): VITAMINB12, FOLATE, FERRITIN, TIBC, IRON,  RETICCTPCT in the last 72 hours. Urine analysis:    Component Value Date/Time   COLORURINE YELLOW 07/18/2017 1935   APPEARANCEUR CLEAR 07/18/2017 1935   LABSPEC 1.010 07/18/2017 1935   PHURINE 7.0 07/18/2017 1935   GLUCOSEU NEGATIVE 07/18/2017 1935   HGBUR SMALL (A) 07/18/2017 1935   BILIRUBINUR NEGATIVE 07/18/2017 1935   KETONESUR NEGATIVE 07/18/2017 1935   PROTEINUR NEGATIVE 07/18/2017 1935   UROBILINOGEN 1.0 08/23/2014 1839   NITRITE NEGATIVE 07/18/2017 1935   LEUKOCYTESUR TRACE (A) 07/18/2017 1935   Sepsis Labs: @LABRCNTIP (AVWUJWJXBJYNW:2,NFAOZHYQMVH:8) )No results found for this or any previous visit (from the past 240 hour(s)).      UA   no evidence of UTI    No results found for: HGBA1C  CrCl cannot be calculated (Unknown ideal weight.).  BNP (last 3 results) No results for input(s): PROBNP in the last 8760 hours.   ECG REPORT  Independently reviewed Rate:88  Rhythm: NSR ST&T Change: No acute ischemic changes   QTC 467  There were no vitals filed for this visit.   Cultures: No results found for: SDES, SPECREQUEST, CULT, REPTSTATUS   Radiological Exams on Admission: Dg Chest 2 View  Result Date: 07/18/2017 CLINICAL DATA:  Initial evaluation for acute altered mental status. EXAM: CHEST  2 VIEW COMPARISON:  Prior radiograph from 08/23/2014. FINDINGS: Cardiac and mediastinal silhouettes are stable in size and contour, and remain within normal limits. Lungs hypoinflated. Linear opacity at the left lung base most consistent with atelectasis and/or scar. Underlying emphysematous changes. No focal infiltrates. No pulmonary edema or pleural effusion. No pneumothorax. There is question of 2 adjacent nodular densities overlying the peripheral right lung measuring up to approximately 2 cm each. While these are favored to reflect summation of overlying osseous shadows, underlying nodules not entirely excluded. These are not seen on lateral projection. No acute osseus  abnormality.  Osteopenia. IMPRESSION: 1. Shallow lung inflation with mild left basilar atelectasis and/or scarring. No other active cardiopulmonary disease. 2. Two adjacent approximate 2 cm nodular densities overlying the peripheral right lower lung. While one and/or both of these are favored to reflect summation of overlying osseous shadows, underlying pulmonary nodules are not entirely excluded. Follow-up examination with dedicated cross-sectional imaging of the chest recommended for further characterization. 3. Underlying emphysema. Electronically Signed   By: Jeannine Boga M.D.   On: 07/18/2017 18:45   Ct Head Wo Contrast  Result Date: 07/18/2017 CLINICAL DATA:  Initial evaluation for acute altered mental status. EXAM: CT HEAD WITHOUT CONTRAST TECHNIQUE: Contiguous axial images were obtained from the base of the skull through the vertex without intravenous contrast. COMPARISON:  Prior CT from 08/23/2014. FINDINGS: Brain: Generalized age-related cerebral atrophy with chronic microvascular ischemic disease. Remote right frontal and occipital lobe infarcts noted. No acute intracranial hemorrhage. No evidence for acute large vessel territory infarct. No mass lesion, midline shift or mass effect. No hydrocephalus. No extra-axial fluid collection. Vascular: No hyperdense vessel. Scattered vascular calcifications noted within the carotid siphons. Skull: Scalp soft tissues and calvarium within normal  limits. Sinuses/Orbits: Globes oral soft tissues normal. Patient status post lens extraction bilaterally. Mild scattered mucosal thickening within the ethmoidal air cells. Paranasal sinuses otherwise clear. No mastoid effusion. Other: None. IMPRESSION: 1. No acute intracranial process. 2. Remote right frontal and occipital lobe infarcts. 3. Generalized cerebral atrophy with mild to moderate chronic small vessel ischemic disease. Electronically Signed   By: Jeannine Boga M.D.   On: 07/18/2017 18:38     Chart has been reviewed    Assessment/Plan  71 y.o. male with medical history significant of chronic liver disease Due to Hep C  Admitted for acute hepatic  encephalopathy  Present on Admission: . Acute hepatic encephalopathy - Order lactulose, titrate to 3 BM a day,  . Hepatitis C - chronic, Have been treated in the past. Obtain viral load to see if have resolved  . HTN (hypertension) -  contiune spironolactone, add low dose lasix . Hyponatremia - in the setting of cirrhosis, chronic obtain urine electrolytes.  . Pulmonary nodules - will need follow up as outpatient Thrombocytopenia - due to cirrhosis - continue to follow . Hyperammonemia (HCC) - mild but patient is symptomatic . Cirrhosis ([employer]) - due to Hep C possibly ongoing EtOH use. Not a candidate for transplant. Would need GI follow up  . Cellulitis - mild continue Keflex   alcohol use/abuse - CIWA protocol  Other plan as per orders.  DVT prophylaxis:  SCD      Code Status:  FULL CODE as per patient    Family Communication:   Family not  at  Bedside   Disposition Plan:  likely will need placement for rehabilitation                                        Would benefit from PT/OT eval prior to DC  ordered                      Social Work   Nutrition   consulted                          Consults called: none  Admission status:   inpatient     Level of care     medical floor            I have spent a total of 56 min on this admission   Mette Southgate 07/19/2017, 1:28 AM    [facility]  Pager [phone]   after 2 AM please page floor coverage PA If 7AM-7PM, please contact the day team taking care of the patient  [email or web address]  Password [email or web address]

## 2017-07-18 NOTE — ED Triage Notes (Signed)
Per [facility] patient coming from home after sister in [place] called out stating something was wrong with her brother she could tell when she was on the phone with him. Sister reports patient having a history of cirrhosis r/t hepatitis c and has had high ammonia levels in the past. When EMS arrived to patient's home the door was unlocked, the sink was running over, and patient was found in bathroom naked on toilet with blood on the floor. Skin tear noted to left leg. Patient alert and only oriented to person per EMS.

## 2017-07-18 NOTE — ED Notes (Signed)
Pt noted to be out of bed and wadering around room, blood noted on floor from skin tear. EDP aware, pt assisted back in bed, wet to dry dressing applied to skin tear. PT placed on bed alarm VSS. Pt denies injury

## 2017-07-18 NOTE — ED Notes (Signed)
Patient attempting to pee in urinal without success

## 2017-07-19 LAB — CBC
HEMATOCRIT: 34.5 % — AB (ref 39.0–52.0)
HEMOGLOBIN: 12.1 g/dL — AB (ref 13.0–17.0)
MCH: 31.8 pg (ref 26.0–34.0)
MCHC: 35.1 g/dL (ref 30.0–36.0)
MCV: 90.6 fL (ref 78.0–100.0)
Platelets: 57 10*3/uL — ABNORMAL LOW (ref 150–400)
RBC: 3.81 MIL/uL — AB (ref 4.22–5.81)
RDW: 14.9 % (ref 11.5–15.5)
WBC: 4.4 10*3/uL (ref 4.0–10.5)

## 2017-07-19 LAB — COMPREHENSIVE METABOLIC PANEL
ALBUMIN: 3.1 g/dL — AB (ref 3.5–5.0)
ALK PHOS: 59 U/L (ref 38–126)
ALT: 25 U/L (ref 17–63)
ANION GAP: 8 (ref 5–15)
AST: 34 U/L (ref 15–41)
BUN: 14 mg/dL (ref 6–20)
CALCIUM: 8.5 mg/dL — AB (ref 8.9–10.3)
CO2: 23 mmol/L (ref 22–32)
Chloride: 97 mmol/L — ABNORMAL LOW (ref 101–111)
Creatinine, Ser: 1.03 mg/dL (ref 0.61–1.24)
GFR calc non Af Amer: >60 mL/min (ref >60)
GLUCOSE: 89 mg/dL (ref 65–99)
POTASSIUM: 3.7 mmol/L (ref 3.5–5.1)
SODIUM: 128 mmol/L — AB (ref 135–145)
Total Bilirubin: 2.8 mg/dL — ABNORMAL HIGH (ref 0.3–1.2)
Total Protein: 5.5 g/dL — ABNORMAL LOW (ref 6.5–8.1)

## 2017-07-19 LAB — PROTIME-INR
INR: 1.29
PROTHROMBIN TIME: 16 s — AB (ref 11.4–15.2)

## 2017-07-19 LAB — AMMONIA: Ammonia: 48 umol/L — ABNORMAL HIGH (ref 9–35)

## 2017-07-19 LAB — TSH: TSH: 2.11 u[IU]/mL (ref 0.350–4.500)

## 2017-07-19 LAB — APTT: aPTT: 33 seconds (ref 24–36)

## 2017-07-19 LAB — PHOSPHORUS: PHOSPHORUS: 4 mg/dL (ref 2.5–4.6)

## 2017-07-19 LAB — MAGNESIUM: MAGNESIUM: 2.1 mg/dL (ref 1.7–2.4)

## 2017-07-19 MED ORDER — ADULT MULTIVITAMIN W/MINERALS CH
1.0000 | ORAL_TABLET | Freq: Every day | ORAL | Status: DC
  Administered 2017-07-19 – 2017-07-21 (×3): 1 via ORAL
  Filled 2017-07-19 (×3): qty 1

## 2017-07-19 MED ORDER — INFLUENZA VAC SPLIT HIGH-DOSE 0.5 ML IM SUSY
0.5000 mL | PREFILLED_SYRINGE | INTRAMUSCULAR | Status: AC
  Administered 2017-07-20: 0.5 mL via INTRAMUSCULAR
  Filled 2017-07-19 (×2): qty 0.5

## 2017-07-19 MED ORDER — LACTULOSE 10 GM/15ML PO SOLN
30.0000 g | Freq: Three times a day (TID) | ORAL | Status: DC
  Administered 2017-07-19 – 2017-07-21 (×8): 30 g via ORAL
  Filled 2017-07-19 (×8): qty 45

## 2017-07-19 MED ORDER — SPIRONOLACTONE 100 MG PO TABS
300.0000 mg | ORAL_TABLET | Freq: Every day | ORAL | Status: DC
Start: 2017-07-19 — End: 2017-07-21
  Administered 2017-07-19 – 2017-07-21 (×3): 300 mg via ORAL
  Filled 2017-07-19: qty 6
  Filled 2017-07-19 (×3): qty 3
  Filled 2017-07-19 (×2): qty 6

## 2017-07-19 MED ORDER — PANTOPRAZOLE SODIUM 40 MG PO TBEC
40.0000 mg | DELAYED_RELEASE_TABLET | Freq: Every day | ORAL | Status: DC
  Administered 2017-07-19 – 2017-07-21 (×3): 40 mg via ORAL
  Filled 2017-07-19 (×3): qty 1

## 2017-07-19 MED ORDER — SODIUM CHLORIDE 0.9% FLUSH: 3.0000 mL | INTRAVENOUS | Status: DC | PRN

## 2017-07-19 MED ORDER — ACETAMINOPHEN 325 MG PO TABS: 650.0000 mg | ORAL_TABLET | Freq: Four times a day (QID) | ORAL | Status: DC | PRN

## 2017-07-19 MED ORDER — ONDANSETRON HCL 4 MG/2ML IJ SOLN: 4.0000 mg | Freq: Four times a day (QID) | INTRAMUSCULAR | Status: DC | PRN

## 2017-07-19 MED ORDER — ACETAMINOPHEN 650 MG RE SUPP
650.0000 mg | Freq: Four times a day (QID) | RECTAL | Status: DC | PRN
Start: 2017-07-19 — End: 2017-07-21

## 2017-07-19 MED ORDER — SODIUM CHLORIDE 0.9% FLUSH
3.0000 mL | Freq: Two times a day (BID) | INTRAVENOUS | Status: DC
  Administered 2017-07-19 – 2017-07-21 (×3): 3 mL via INTRAVENOUS

## 2017-07-19 MED ORDER — LORAZEPAM 2 MG/ML IJ SOLN: 1.0000 mg | Freq: Four times a day (QID) | INTRAMUSCULAR | Status: DC | PRN

## 2017-07-19 MED ORDER — VITAMIN B-1 100 MG PO TABS
100.0000 mg | ORAL_TABLET | Freq: Every day | ORAL | Status: DC
  Administered 2017-07-19 – 2017-07-21 (×3): 100 mg via ORAL
  Filled 2017-07-19 (×3): qty 1

## 2017-07-19 MED ORDER — ENSURE ENLIVE PO LIQD
237.0000 mL | Freq: Every day | ORAL | Status: DC
  Administered 2017-07-19 – 2017-07-21 (×3): 237 mL via ORAL

## 2017-07-19 MED ORDER — HYDROCODONE-ACETAMINOPHEN 5-325 MG PO TABS: 1.0000 | ORAL_TABLET | ORAL | Status: DC | PRN

## 2017-07-19 MED ORDER — ONDANSETRON HCL 4 MG PO TABS: 4.0000 mg | ORAL_TABLET | Freq: Four times a day (QID) | ORAL | Status: DC | PRN

## 2017-07-19 MED ORDER — FUROSEMIDE 20 MG PO TABS
20.0000 mg | ORAL_TABLET | Freq: Two times a day (BID) | ORAL | Status: DC
  Filled 2017-07-19: qty 1

## 2017-07-19 MED ORDER — SODIUM CHLORIDE 0.9% FLUSH
3.0000 mL | Freq: Two times a day (BID) | INTRAVENOUS | Status: DC
  Administered 2017-07-19 – 2017-07-21 (×4): 3 mL via INTRAVENOUS

## 2017-07-19 MED ORDER — SODIUM CHLORIDE 0.9 % IV SOLN: 250.0000 mL | INTRAVENOUS | Status: DC | PRN

## 2017-07-19 MED ORDER — CEPHALEXIN 500 MG PO CAPS
500.0000 mg | ORAL_CAPSULE | Freq: Four times a day (QID) | ORAL | Status: DC
  Administered 2017-07-19 – 2017-07-21 (×10): 500 mg via ORAL
  Filled 2017-07-19 (×10): qty 1

## 2017-07-19 MED ORDER — THIAMINE HCL 100 MG/ML IJ SOLN
100.0000 mg | Freq: Every day | INTRAMUSCULAR | Status: DC
  Filled 2017-07-19 (×2): qty 2

## 2017-07-19 MED ORDER — LORAZEPAM 1 MG PO TABS: 1.0000 mg | ORAL_TABLET | Freq: Four times a day (QID) | ORAL | Status: DC | PRN

## 2017-07-19 MED ORDER — FOLIC ACID 1 MG PO TABS
1.0000 mg | ORAL_TABLET | Freq: Every day | ORAL | Status: DC
  Administered 2017-07-19 – 2017-07-21 (×3): 1 mg via ORAL
  Filled 2017-07-19 (×3): qty 1

## 2017-07-19 MED ORDER — FUROSEMIDE 10 MG/ML IJ SOLN
40.0000 mg | Freq: Two times a day (BID) | INTRAMUSCULAR | Status: DC
  Administered 2017-07-19 – 2017-07-21 (×5): 40 mg via INTRAVENOUS
  Filled 2017-07-19 (×5): qty 4

## 2017-07-19 NOTE — Progress Notes (Signed)
New Admission Note: Pt transferred from [facility] to [facility]  Arrival Method: via bed Mental Orientation: Alert and oriented x 4 (mild deficit to time Telemetry: None Assessment: Completed Skin: Bilateral LE's IV: L fa SL Pain: Denies Tubes: None Safety Measures: Safety Fall Prevention Plan has been discussed  Admission: To be completed 6 [place] Orientation: Patient has been orientated to the room, unit and staff.  Family:  None at bedside  Orders to be reviewed and implemented. Will continue to monitor the patient. Call light has been placed within reach and bed alarm has been activated.   Mady Gemma, BSN, RN-BC Phone: [phone]

## 2017-07-19 NOTE — Progress Notes (Signed)
Initial Nutrition Assessment  DOCUMENTATION CODES:   Non-severe (moderate) malnutrition in context of chronic illness  INTERVENTION:   -Snacks TID between meals  -Continue MVI  -Trial of Ensure Enlive po daily, each supplement provides 350 kcal and 20 grams of protein  NUTRITION DIAGNOSIS:   Malnutrition (Moderate) related to chronic illness (chronic liver disease) as evidenced by severe fluid accumulation, moderate depletions of muscle mass, mild depletion of body fat.  GOAL:   Patient will meet greater than or equal to 90% of their needs  MONITOR:   PO intake, Supplement acceptance, Labs, Weight trends  REASON FOR ASSESSMENT:   Consult  (malnutrition)  ASSESSMENT:   71 yo male admitted with acute hepatic encephalopathy. Pt with hx of chronic liver disease due to Hepatitis C, HTN   Recorded po intake 100% at breakfast this AM. Pt reports he ate 100% at lunch today as well. Pt reports good appetite PTA as well eating 3 meals per day with 2 snacks in between.   Pt reports no weight loss, reports UBW around 165 pounds. Current wt 168 pounds. No recent wt encounters. Per chart review, pt weighed 179 pounds in July 2015.  Nutrition-Focused physical exam completed. Findings are mild fat depletion, mild/moderate muscle depletion, and severe edema.   Labs: sodium 128, Creatinine wdl, ammonia 66 Meds: foliac acid, lasix, lactulose, MVI, thiamine  Diet Order:  Diet Heart Room service appropriate? Yes; Fluid consistency: Thin; Fluid restriction: 1500 mL Fluid  Skin:  Reviewed, no issues  Last BM:  9/24  Height:   Ht Readings from Last 1 Encounters:  07/19/17 5\' 2"  (1.575 m)    Weight:   Wt Readings from Last 1 Encounters:  07/19/17 168 lb 14.4 oz (76.6 kg)   BMI:  Body mass index is 30.89 kg/m.  Estimated Nutritional Needs:   Kcal:  1760-1970 kcals  Protein:  90-108 g  Fluid:  1.5 L per day  EDUCATION NEEDS:   No education needs identified at this  time  [place], [place], LDN [phone] Pager  [phone] Weekend/On-Call Pager

## 2017-07-19 NOTE — Consult Note (Signed)
[facility] CM Primary Care Navigator  07/19/2017  Joshua [name] 1946/02/26 [id number]   Met with patient at the bedside to identify possible discharge needs.  Patient reports having confusion/ disorientation and fall in the bathroom that had led to this admission.  Patient [facility] Hazle Quant [facility] as hisprimary [facility] provider.  Patient states using [facility] pharmacy on [address] to obtain medications without any problem. Patient reports managing his ownmedications at home using "pill box" system filled weekly. He verbalized using Uber transportationto hisdoctor's appointments. Patient lives alone with his dog. His friend Joshua [name].) will be able to provide assistance with his care needs at home per patient. Patient declined offer for [facility] list of personal care services to assist him at home.  Anticipated discharge plan is home according to patient with home health services per therapy recommendation.  Patientexpressed understanding to call primary care provider's office when he returnshome for a post discharge follow-up appointment within a week or sooner if needed. Patient letter (with PCP's contact number) was provided as a reminder.  Explained to patient about [facility] CM services available for health management at home but he denied anyneeds or concerns at this time, however,he opted and verbally agreed for [facility] calls tofollow-up his recovery.   Referral was made forEMMI Generalcalls after discharge.  Patient voiced understandingto seek referral from primary care provider to [facility] care management ifnecessaryand deemed appropriate for services in the future.  [facility] care management information provided for future needs that he may have.  For questions, please contact:  Dannielle Huh, BSN, RN- [facility] Primary Care Navigator  Telephone: [phone] [place]
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
## 2017-07-19 NOTE — Care Management Note (Signed)
Case Management Note  Patient Details  Name: PRANAY HILBUN MRN: [id number] Date of Birth: 01/29/1946  Subjective/Objective:     CM following for progression and d/c planning.                Action/Plan: 07/19/2017 Per PT eval pt has no HH or DME needs. Pt is not home bound and demonstrates no need for support or rehab.   Expected Discharge Date:                  Expected Discharge Plan:  Home/Self Care  In-House Referral:  Clinical Social Work  Discharge planning Services  NA  Post Acute Care Choice:  NA Choice offered to:  NA  DME Arranged:  N/A DME Agency:  NA  HH Arranged:  NA HH Agency:  NA  Status of Service:  Completed, signed off  If discussed at [place] of Stay Meetings, dates discussed:    Additional Comments:  Adron Bene, RN 07/19/2017, 11:55 AM
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
## 2017-07-20 LAB — BASIC METABOLIC PANEL
Anion gap: 8 (ref 5–15)
BUN: 13 mg/dL (ref 6–20)
CALCIUM: 7.8 mg/dL — AB (ref 8.9–10.3)
CO2: 21 mmol/L — AB (ref 22–32)
CREATININE: 1.01 mg/dL (ref 0.61–1.24)
Chloride: 99 mmol/L — ABNORMAL LOW (ref 101–111)
GLUCOSE: 80 mg/dL (ref 65–99)
Potassium: 3.5 mmol/L (ref 3.5–5.1)
Sodium: 128 mmol/L — ABNORMAL LOW (ref 135–145)

## 2017-07-20 LAB — CBC
HCT: 30.1 % — ABNORMAL LOW (ref 39.0–52.0)
Hemoglobin: 10.6 g/dL — ABNORMAL LOW (ref 13.0–17.0)
MCH: 31.9 pg (ref 26.0–34.0)
MCHC: 35.2 g/dL (ref 30.0–36.0)
MCV: 90.7 fL (ref 78.0–100.0)
PLATELETS: 48 10*3/uL — AB (ref 150–400)
RBC: 3.32 MIL/uL — AB (ref 4.22–5.81)
RDW: 14.8 % (ref 11.5–15.5)
WBC: 5.3 10*3/uL (ref 4.0–10.5)

## 2017-07-20 LAB — HCV RNA QUANT: HCV QUANT: NOT DETECTED [IU]/mL (ref >50)

## 2017-07-20 MED ORDER — RIFAXIMIN 550 MG PO TABS
550.0000 mg | ORAL_TABLET | Freq: Two times a day (BID) | ORAL | Status: DC
  Administered 2017-07-20 – 2017-07-21 (×2): 550 mg via ORAL
  Filled 2017-07-20 (×2): qty 1

## 2017-07-20 NOTE — Consult Note (Addendum)
[place] Nurse wound consult note Reason for Consult: Consult requested for bilat legs.  Pt had a fall prior to admission and has 2 full thickness wounds to anterior calves. BLE with generalized edema. Wound type: Left leg with skin tear; skin approximated over 2/3 of the wound which is approx 6X5cm; dark black flap with old congealed blood underneath the skin flap which is adhered. Exposed red moist wound bed in an irregular shape; length and width are the same size as the flap and approx .5cm wide with a depth of .2cm.  Small amt yellow drainage, no odor.  Expect black area of the skin flap may not be not viable and may eventually slough off.   Right leg with full thickness abrasion which has evolved into a stasis ulcer; irregular-shaped .8X.8X.2cm, yellow moist wound bed with mod amt yellow drainage, no odor. Dressing procedure/placement/frequency: Xeroform gauze to promote drying and healing to bilat wounds, foam dressing to protect from further injury. Discussed plan of care with patient and he verbalized understanding. Please re-consult if further assistance is needed.  Thank-you,  Julien Girt MSN, [place], [place], [place], [place]

## 2017-07-20 NOTE — Plan of Care (Signed)
Problem: Safety: Goal: Ability to remain free from injury will improve Outcome: Progressing Risk factors for falls assessed. No falls noted  Problem: Health Behavior/Discharge Planning: Goal: Ability to manage health-related needs will improve Outcome: Progressing Compliance with scheduled meds encouraged. Pt is compliant so far during med pass  Problem: Pain Managment: Goal: General experience of comfort will improve Outcome: Progressing Pain status being assessed and treated  Problem: Physical Regulation: Goal: Ability to maintain clinical measurements within normal limits will improve Outcome: Progressing Response for tx being monitored. Pt compliant with the med ordered Goal: Will remain free from infection Outcome: Progressing Infection prevention measures assessed  Problem: Skin Integrity: Goal: Risk for impaired skin integrity will decrease Outcome: Progressing Provide skin care  Problem: Tissue Perfusion: Goal: Risk factors for ineffective tissue perfusion will decrease Outcome: Progressing Risk for VTE assessed  Problem: Activity: Goal: Risk for activity intolerance will decrease Outcome: Progressing Monitor signs of activity intolerance

## 2017-07-21 LAB — BASIC METABOLIC PANEL
Anion gap: 9 (ref 5–15)
BUN: 14 mg/dL (ref 6–20)
CALCIUM: 8.2 mg/dL — AB (ref 8.9–10.3)
CO2: 22 mmol/L (ref 22–32)
CREATININE: 1.03 mg/dL (ref 0.61–1.24)
Chloride: 99 mmol/L — ABNORMAL LOW (ref 101–111)
GFR calc Af Amer: >60 mL/min (ref >60)
GLUCOSE: 86 mg/dL (ref 65–99)
Potassium: 3.9 mmol/L (ref 3.5–5.1)
Sodium: 130 mmol/L — ABNORMAL LOW (ref 135–145)

## 2017-07-21 LAB — CBC
HCT: 33.2 % — ABNORMAL LOW (ref 39.0–52.0)
HEMOGLOBIN: 11.5 g/dL — AB (ref 13.0–17.0)
MCH: 31.5 pg (ref 26.0–34.0)
MCHC: 34.6 g/dL (ref 30.0–36.0)
MCV: 91 fL (ref 78.0–100.0)
PLATELETS: 54 10*3/uL — AB (ref 150–400)
RBC: 3.65 MIL/uL — ABNORMAL LOW (ref 4.22–5.81)
RDW: 14.9 % (ref 11.5–15.5)
WBC: 6.8 10*3/uL (ref 4.0–10.5)

## 2017-07-21 NOTE — Discharge Summary (Signed)
Physician Discharge Summary  Joshua [name] QMG:[id number] DOB: June 30, 1946 DOA: 07/18/2017  PCP: [name], MD  Admit date: 07/18/2017 Discharge date: 07/21/2017  Time spent: > 35 minutes  Recommendations for Outpatient Follow-up:  1.    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 He was found in his apartment naked on the bathroom floor with blood all over. In the emergency department, he was confused but stated that he had, because his ammonia level needed to be treated. He received interferon for his hepatitis C which did not work and then was subsequently treated with Harvoni.  Has a history of grade 2 esophageal varices. Patient is already feeling somewhat better after receiving lactulose. Bleeding seems to have come from a gash on his left leg.  Hospital Course:  Acute hepatic encephalopathy, ammonia level started to trend down and patient appears clinically improved but not back to baseline -  Continue prior to admission lactulose regimen. I Discussed continued compliance with medication regimen. Patient verbalizes agreement and understanding.  Cirrhosis due to hepatitis C, chronic, previously treated, appears volume overloaded -  Continue prior to admission medication regimen.  Thrombocytopenia secondary to cirrhosis, platelets trending down -  Repeat CBC in AM  Hyponatremia, likely secondary to cirrhosis with volume overload -  Diuresis -  TSH within  normal limits  Cellulitis, appears to be more stasis dermatitis  -  suspect most likely statis dermatitis. Will d/c antibiotics. Pt afebrile and wbc within normal limits.   Alcohol abuse, no signs of alcohol withdrawal during my visit -  no signs of anxiety on day of d/c  Possible pulmonary nodule seen on chest x-ray -  Recommend outpatient CT of the chest   Procedures:  None  Consultations:  None  Discharge Exam: Vitals:   07/21/17 1000 07/21/17 1500  BP: 128/73 112/60  Pulse: 70 85  Resp: 18 16  Temp: 97.7 F (36.5 C) 98.7 F (37.1 C)  SpO2: 98% 100%    General: Pt in nad, alert and awake Cardiovascular: rrr, no rub Respiratory: no increased wob, no wheezes  Discharge Instructions   Discharge Instructions    Call MD for:  extreme fatigue    Complete by:  As directed    Call MD for:  temperature >100.4    Complete by:  As directed    Diet - low sodium heart healthy    Complete by:  As directed    Discharge instructions    Complete by:  As directed    Please continue take your medications as recommended. Follow up with your primary care physician.   Increase activity slowly    Complete by:  As directed      Current Discharge Medication List    CONTINUE these medications which have NOT CHANGED   Details  amphetamine-dextroamphetamine (ADDERALL) 10 MG tablet Take 20 mg by mouth daily.     Docusate Calcium (STOOL SOFTENER PO) Take 1 tablet by  mouth daily as needed (constipation).     furosemide (LASIX) 40 MG tablet Take 40 mg by mouth daily. Refills: 9    HYDROcodone-acetaminophen (NORCO) 10-325 MG tablet Take 0.5-1 tablets by mouth every 8 (eight) hours as needed. Refills: 0    lactulose (CHRONULAC) 10 GM/15ML solution Take 30 g by mouth 3 (three) times daily.    pantoprazole (PROTONIX) 40 MG tablet Take 40 mg by mouth daily. Refills: 3    SODIUM FLUORIDE, DENTAL RINSE, (PREVIDENT) 0.2 % SOLN Place onto teeth See admin instructions. Rinse with  small amount for one minute and then spit; do not eat, drink or rinse for 30 minutes after using    spironolactone (ALDACTONE) 100 MG tablet Take 1 tablet (100 mg total) by mouth 2 (two) times daily.    zolpidem (AMBIEN) 5 MG tablet Take 5 mg by mouth at bedtime. Refills: 0    Multiple Vitamins-Minerals (MULTIVITAMIN WITH MINERALS) tablet Take 1 tablet by mouth daily.    Polyethyl Glycol-Propyl Glycol (SYSTANE OP) Place 1 drop into both eyes at bedtime.      STOP taking these medications     traMADol (ULTRAM) 50 MG tablet        Allergies  Allergen Reactions  . Codeine Itching      The results of significant diagnostics from this hospitalization (including imaging, microbiology, ancillary and laboratory) are listed below for reference.    Significant Diagnostic Studies: Dg Chest 2 View  Result Date: 07/18/2017 CLINICAL DATA:  Initial evaluation for acute altered mental status. EXAM: CHEST  2 VIEW COMPARISON:  Prior radiograph from 08/23/2014. FINDINGS: Cardiac and mediastinal silhouettes are stable in size and contour, and remain within normal limits. Lungs hypoinflated. Linear opacity at the left lung base most consistent with atelectasis and/or scar. Underlying emphysematous changes. No focal infiltrates. No pulmonary edema or pleural effusion. No pneumothorax. There is question of 2 adjacent nodular densities overlying the peripheral right lung measuring up to approximately 2 cm each. While these are favored to reflect summation of overlying osseous shadows, underlying nodules not entirely excluded. These are not seen on lateral projection. No acute osseus abnormality.  Osteopenia. IMPRESSION: 1. Shallow lung inflation with mild left basilar atelectasis and/or scarring. No other active cardiopulmonary disease. 2. Two adjacent approximate 2 cm nodular densities overlying the peripheral right lower lung. While one and/or both of these are favored to reflect summation of overlying  osseous shadows, underlying pulmonary nodules are not entirely excluded. Follow-up examination with dedicated cross-sectional imaging of the chest recommended for further characterization. 3. Underlying emphysema. Electronically Signed   By: Jeannine Boga M.D.   On: 07/18/2017 18:45   Ct Head Wo Contrast  Result Date: 07/18/2017 CLINICAL DATA:  Initial evaluation for acute altered mental status. EXAM: CT HEAD WITHOUT CONTRAST TECHNIQUE: Contiguous axial images were obtained from the base of the skull through the vertex without intravenous contrast. COMPARISON:  Prior CT from 08/23/2014. FINDINGS: Brain: Generalized age-related cerebral atrophy with chronic microvascular ischemic disease. Remote right frontal and occipital lobe infarcts noted. No acute intracranial hemorrhage. No evidence for acute large vessel territory infarct. No mass lesion, midline shift or mass effect. No hydrocephalus. No extra-axial fluid collection. Vascular: No hyperdense vessel. Scattered vascular calcifications noted within the carotid siphons. Skull: Scalp soft tissues and calvarium within normal limits. Sinuses/Orbits: Globes oral soft tissues normal. Patient status post lens extraction bilaterally. Mild scattered mucosal thickening within the ethmoidal air cells. Paranasal sinuses otherwise clear. No mastoid effusion. Other: None. IMPRESSION: 1.  No acute intracranial process. 2. Remote right frontal and occipital lobe infarcts. 3. Generalized cerebral atrophy with mild to moderate chronic small vessel ischemic disease. Electronically Signed   By: Jeannine Boga M.D.   On: 07/18/2017 18:38    Microbiology: No results found for this or any previous visit (from the past 240 hour(s)).   Labs: Basic Metabolic Panel:  Recent Labs Lab 07/18/17 1806 07/19/17 0607 07/20/17 0319 07/21/17 0233  NA 127* 128* 128* 130*  K 4.2 3.7 3.5 3.9  CL 97* 97* 99* 99*  CO2 21* 23 21* 22  GLUCOSE 96 89 80 86  BUN 16 14 13  14   CREATININE 1.00 1.03 1.01 1.03  CALCIUM 8.6* 8.5* 7.8* 8.2*  MG  --  2.1  --   --   PHOS  --  4.0  --   --    Liver Function Tests:  Recent Labs Lab 07/18/17 1806 07/19/17 0607  AST 33 34  ALT 25 25  ALKPHOS 60 59  BILITOT 2.6* 2.8*  PROT 5.5* 5.5*  ALBUMIN 3.1* 3.1*   No results for input(s): LIPASE, AMYLASE in the last 168 hours.  Recent Labs Lab 07/18/17 1757 07/19/17 0607  AMMONIA 66* 48*   CBC:  Recent Labs Lab 07/18/17 1806 07/19/17 0607 07/20/17 0319 07/21/17 0233  WBC 5.9 4.4 5.3 6.8  NEUTROABS 3.8  --   --   --   HGB 12.4* 12.1* 10.6* 11.5*  HCT 35.3* 34.5* 30.1* 33.2*  MCV 89.8 90.6 90.7 91.0  PLT 70* 57* 48* 54*   Cardiac Enzymes: No results for input(s): CKTOTAL, CKMB, CKMBINDEX, TROPONINI in the last 168 hours. BNP: BNP (last 3 results) No results for input(s): BNP in the last 8760 hours.  ProBNP (last 3 results) No results for input(s): PROBNP in the last 8760 hours.  CBG: No results for input(s): GLUCAP in the last 168 hours.   Signed:  Velvet Bathe MD.  [facility] 07/21/2017, 3:23 PM

## 2017-07-21 NOTE — Plan of Care (Signed)
Problem: Safety: Goal: Ability to remain free from injury will improve Outcome: Progressing Risk factors for falls assessed. Safe environment provided  Problem: Health Behavior/Discharge Planning: Goal: Ability to manage health-related needs will improve Outcome: Progressing Encouraged compliance with prescribed medication regimen and assess for support  Problem: Pain Managment: Goal: General experience of comfort will improve Outcome: Progressing Monitor pain status  Problem: Physical Regulation: Goal: Ability to maintain clinical measurements within normal limits will improve Outcome: Progressing Monitor response for tx Goal: Will remain free from infection Outcome: Progressing Provide infection prevention measures  Problem: Skin Integrity: Goal: Risk for impaired skin integrity will decrease Outcome: Progressing Assess risk factors for impaired skin integrity and or pressure injuries  Problem: Bowel/Gastric: Goal: Will not experience complications related to bowel motility Outcome: Progressing No complications related to bowel motility

## 2017-07-21 NOTE — Progress Notes (Signed)
Pt found about 30 min ago to have blood to his right hand and to his gown. Noted that his iv came out without the pt noticing it. He was cleaned and bleeding controlled and another iv placed to his left hand.

## 2017-07-23 NOTE — Progress Notes (Signed)
Spoke with patient's sister, Allison,at length. [name] concerned re:pt's discharge. [name] came yesterday to visit pt and found him confused and unable to know meds he was d/c'd home on. After much conversation, [name] found d/c instructions,we went over meds prescribed and other meds found at pt's home. Pt able to tell me his name,DOB,where he was, who his sister was,day of week and gave permission to talk with sister. Pt.had had glass of wine last night and Ambien. Unsure if pt had had alcohol prior to that. Sister to observe pt and if "confusion" continues,will call MD or bring him back to ED.

## 2017-07-25 ENCOUNTER — Other Ambulatory Visit: Payer: Self-pay | Admitting: *Deleted

## 2017-07-25 NOTE — Patient Outreach (Signed)
[place] [facility]) Care Management  07/25/2017  Joshua [name] 1946-10-20 [id number]  Referral via EMMI-General Discharge -Red Alert -Day #1, 07/23/2017; Reason: Got discharge papers-"I don't know" Do you know who to call for change in condition?-no  Per hx-patient admitted 9./24-9/27/2018/dx cellulitis, cirrhosis, hep c, acute encephalopathy, HTN,   Telephone call to patient who answered call and gave HIPPA verification. Patient gave sister permission to speak with this care coordinator about his health. Sister-Joshua [name] states she is currently caring for her brother-patient since 07/22/2017 .  HIPPA verification received from caregiver-sister.     Sister/caregiver/Joshua states she drove from [place] got to her brother's home 1 day after he was discharged from hospital. States  patient lives alone and sometimes forgets things. States he did not know where his discharge papers were & was not sure about his medications.  States she found papers & called hospital & instructions were reviewed with her by nurse. States she is currently managing patient's medications & she is making sure he takes as prescribed. States she is concerned about gash in patient's leg. States patient is covering area with dry dressing.  Caregiver was advised of symptoms that would need to be reported to MD-such as purulent drainage, severe pain, high fever. Caregiver/sister states no pus was noted.    States she has called patient's primary care provider & has appointment for him on tomorrow 10/2. States she will take patient to appointment.  States she wants to get patient in a safer situation. States FL-2 has been completed.  States she also wants to get Healthcare Power of attorney and needs information on how to get it.   Caregiver consents to referral to Clinical Social Worker for psychosocial concerns & information on healthcare power of attorney.  Also agrees to telephonic care coordinator  services.  Call dropped. Return call made & message left on voice mail requesting call back.  Plan: Will follow up to continue with health assessments.  Refer to care management assistant to assign to Clinical Social Worker.   Joshua Daisy, RN BSN CCM Care Management Coordinator [facility] Care Management  [phone]                         .

## 2017-07-26 DIAGNOSIS — I10 Essential (primary) hypertension: Secondary | ICD-10-CM | POA: Diagnosis not present

## 2017-07-26 DIAGNOSIS — Z09 Encounter for follow-up examination after completed treatment for conditions other than malignant neoplasm: Secondary | ICD-10-CM | POA: Diagnosis not present

## 2017-07-26 DIAGNOSIS — K7290 Hepatic failure, unspecified without coma: Secondary | ICD-10-CM | POA: Diagnosis not present

## 2017-07-27 ENCOUNTER — Other Ambulatory Visit: Payer: Self-pay | Admitting: *Deleted

## 2017-07-27 ENCOUNTER — Encounter: Payer: Self-pay | Admitting: *Deleted

## 2017-07-27 DIAGNOSIS — S81802A Unspecified open wound, left lower leg, initial encounter: Secondary | ICD-10-CM | POA: Diagnosis not present

## 2017-07-27 DIAGNOSIS — K7460 Unspecified cirrhosis of liver: Secondary | ICD-10-CM | POA: Diagnosis not present

## 2017-07-27 DIAGNOSIS — K7290 Hepatic failure, unspecified without coma: Secondary | ICD-10-CM | POA: Diagnosis not present

## 2017-07-27 DIAGNOSIS — R911 Solitary pulmonary nodule: Secondary | ICD-10-CM | POA: Diagnosis not present

## 2017-07-27 NOTE — Patient Outreach (Signed)
[place] [facility]) Care Management  07/27/2017  Joshua [name] [birth year] [id number]  #2 Transition of care call to patient; caregiver/sister takes calls for health care concerns; states patient sometimes have memory problems.   Sister/caregiver/Allison states she took patient to MD appointment on 10./3 for hospital follow up . States MD ordered home health services for patient also for wound care on lower leg.. States patient was prescribed antibiotic for next 7 days. States she is managing patient medications & making sure that he it taking as prescribed.   . Voices that patient has also been referred to specialist regarding leg wound. States she will take him to appointment.    Voices currently that she & her brother are working on getting patient in a safe place because he is unable to care for himself. Voices that major health issue is cirrhosis & that ammonia level gets high sometimes  States he has really declined since she saw him last. Voices that hopefully he will go to [facility] next week.    States she received information on obtaining health care power of attorney from [place]. States she & her brother will be meeting with lawyer.     Medications Reviewed Today    Reviewed by Dickie La, RN (Registered Nurse) on 07/29/17 at [place] List Status: <None>  Medication Order Taking? Sig Documenting Provider Last Dose Status Informant  amphetamine-dextroamphetamine (ADDERALL) 10 MG tablet [id number] Yes Take 20 mg by mouth daily.  [provider] Taking Active Pharmacy Records           Med Note Rosine Beat Jul 18, 2017  6:38 PM) #60/30 days filled 07/06/17 [facility]  Docusate Calcium (STOOL SOFTENER PO) [id number] Yes Take 1 tablet by mouth daily as needed (constipation).  [provider] Taking Active Other  furosemide (LASIX) 40 MG tablet [id number] Yes Take 40 mg by mouth daily. [provider] Taking Active  Pharmacy Records  HYDROcodone-acetaminophen [facility]) 10-325 MG tablet [id number] No Take 0.5-1 tablets by mouth every 8 (eight) hours as needed. [provider] Not Taking Active Pharmacy Records           Med Note Rosine Beat Jul 18, 2017  6:36 PM) #20 filled 05/20/17 [facility]  lactulose (CHRONULAC) 10 GM/15ML solution [id number] Yes Take 30 g by mouth 3 (three) times daily. Caren Griffins, MD Taking Active Pharmacy Records           Med Note Rosine Beat Jul 18, 2017  6:29 PM) 7 day supply filled 07/06/17  Multiple Vitamins-Minerals (MULTIVITAMIN WITH MINERALS) tablet [id number] Yes Take 1 tablet by mouth daily. [provider] Taking Active Other  pantoprazole (PROTONIX) 40 MG tablet [id number] Yes Take 40 mg by mouth daily. [provider] Taking Active Pharmacy Records  Polyethyl Glycol-Propyl Glycol [facility]) [id number] Yes Place 1 drop into both eyes at bedtime. [provider] Taking Active Other  SODIUM FLUORIDE, DENTAL RINSE, (PREVIDENT) 0.2 % SOLN [id number] Yes Place onto teeth See admin instructions. Rinse with small amount for one minute and then spit; do not eat, drink or rinse for 30 minutes after using [provider] Taking Active Pharmacy Records  spironolactone (ALDACTONE) 100 MG tablet [id number] Yes Take 1 tablet (100 mg total) by mouth 2 (two) times daily.  Patient taking differently:  Take 300 mg by mouth daily.    Caren Griffins, MD Taking Active Pharmacy Records  sulfamethoxazole-trimethoprim (BACTRIM DS,SEPTRA DS) 800-160 MG tablet [id number] Yes Take 1 tablet by mouth 2 (two) times daily. [provider] Taking Active   zolpidem (AMBIEN) 5 MG tablet [id number] No Take 5 mg by mouth at bedtime. [provider] Not Taking Active Pharmacy Records           Med Note Rosine Beat Jul 18, 2017  6:44 PM) #30/30 days filled 06/07/17          Plan noted below:  [facility] CM Care Plan  Problem One     Most Recent Value  Care Plan Problem One  Potential for infection to open wound on lower leg  Role Documenting the Problem One  Care Management Telephonic Coordinator  Care Plan for Problem One  Active  [facility] Long Term Goal   Caregiver will cite 2 symptoms of infection within 14 days  that should be reported to MD  [facility] Long Term Goal Start Date  07/25/17  Interventions for Problem One Long Term Goal  Advise of sxs on infection-purulent drainage, severe pain, fever , swelling   [facility] CM Short Term Goal #1   Caregiver will report she has made appt for follow up with PCP within 1 week  [facility] CM Short Term Goal #1 Start Date  07/25/17  Interventions for Short Term Goal #1  Advise importance of MD f/u  [facility] CM Short Term Goal #2   Caregiver will report patient is taking medications as prescribed by MD within 1 week  [facility] CM Short Term Goal #2 Start Date  07/25/17  Interventions for Short Term Goal #2  Review medications with caregiver & advise of importance of  patient taking meds as prescribed    [facility] CM Care Plan Problem Two     Most Recent Value  Care Plan Problem Two  Caregiver does not know process for obtaining Healthcare power of attorney  Role Documenting the Problem Two  Care Management Telephonic Coordinator  Care Plan for Problem Two  Active  [facility] CM Short Term Goal #1   Pt will be advised of process of obtaining HCPOA within 1 week  [facility] CM Short Term Goal #1 Start Date  07/25/17  [facility] CM Short Term Goal #1 Met Date   07/27/17  Interventions for Short Term Goal #2   Refer to CSW to assist with requested information       Will follow up next week. Caregiver agrees with set appointment. Welcome letter sent Involvement letter to MD.  [name], [place] Management Coordinator [facility] Care Management  [phone]

## 2017-07-27 NOTE — Patient Outreach (Signed)
[place] [facility]) Care Management  07/27/2017  Joshua [name] [birth year] [id number]   CSW was able to make initial contact with patient's sister/primary caregiver, Joshua [name] today to perform the phone assessment, as well as assess and assist with social work needs and services.  CSW introduced self, explained role and types of services provided through [place] [facility] ([place] [facility]).  CSW further explained to Ms. Joshua [name] that CSW works with patient's Telephonic RNCM, also with [place] [facility], Joshua [name]. CSW then explained the reason for the call, indicating that Mrs. Joshua [name] thought that patient would benefit from social work services and resources to assist with completion of Advanced Directives ([place] documents), as well as assistance with placement into a long-term care assisted living facility.  CSW obtained two HIPAA compliant identifiers from Ms. Joshua [name], which included patient's name and date of birth. Joshua [name] reported that she and her brother, Joshua [name] were able to get patient placed at [facility], where patient will be admitted on October 10th.  Patient is agreeable to placement, realizing that Ms. Joshua [name] is no longer able to adequately care for him in the home.  Ms. Joshua [name] went on to say that Joshua [name] is planning to meet with an attorney tomorrow to try and settle patient's affairs.  In the meantime, Ms. Joshua [name] is requesting that Joshua [name] email her and Joshua [name] a blank Advanced Directives packet, as they are wanting to have the paperwork initiated tomorrow while patient is with Joshua [name] at the attorney's office.  CSW has emailed a Living Will and a Press photographer document to the following email addresses: [name] CSW was able to confirm with Ms. Joshua [name] that both documents were received.  Ms. Joshua [name] denied having any additional  social work needs at present. CSW will perform a case closure on patient, as all goals of treatment have been met from social work standpoint and no additional social work needs have been identified at this time.  CSW will notify patient's Telephonic RNCM with [place] Management, Joshua [name] of CSW's plans to close patient's case.  CSW will fax an update to patient's Primary Care Physician, Dr. Merrilee [name] to ensure that they are aware of CSW's involvement with patient's plan of care.  CSW will submit a case closure request to Joshua [name], Care Management Assistant with [place] [facility], in the form of an [employer].  CSW will ensure that Mrs. Comer is aware of Mrs. Joshua [name]'s, RNCM with [place] Management, continued involvement with patient's care. Joshua [name], BSW, MSW, LCSW  Licensed Education officer, environmental Health System  Mailing [place] [address], [place], [place] [address] Physical Address-[address], [place], [place] [address] Toll Free Main # [phone] Fax # [phone] Cell # [phone]  Office # [phone] [name]

## 2017-07-29 ENCOUNTER — Encounter: Payer: Self-pay | Admitting: *Deleted

## 2017-07-29 ENCOUNTER — Other Ambulatory Visit: Payer: Self-pay | Admitting: Internal Medicine

## 2017-07-29 DIAGNOSIS — R911 Solitary pulmonary nodule: Secondary | ICD-10-CM

## 2017-07-31 DIAGNOSIS — K7290 Hepatic failure, unspecified without coma: Secondary | ICD-10-CM | POA: Diagnosis not present

## 2017-07-31 DIAGNOSIS — S81802D Unspecified open wound, left lower leg, subsequent encounter: Secondary | ICD-10-CM | POA: Diagnosis not present

## 2017-07-31 DIAGNOSIS — S81801D Unspecified open wound, right lower leg, subsequent encounter: Secondary | ICD-10-CM | POA: Diagnosis not present

## 2017-07-31 DIAGNOSIS — R296 Repeated falls: Secondary | ICD-10-CM | POA: Diagnosis not present

## 2017-07-31 DIAGNOSIS — I10 Essential (primary) hypertension: Secondary | ICD-10-CM | POA: Diagnosis not present

## 2017-07-31 DIAGNOSIS — K7460 Unspecified cirrhosis of liver: Secondary | ICD-10-CM | POA: Diagnosis not present

## 2017-08-01 ENCOUNTER — Ambulatory Visit
Admission: RE | Admit: 2017-08-01 | Discharge: 2017-08-01 | Disposition: A | Discharge status: Home / Self Care | Payer: Medicare Other | Source: Ambulatory Visit | Attending: Internal Medicine | Admitting: Internal Medicine

## 2017-08-01 DIAGNOSIS — I10 Essential (primary) hypertension: Secondary | ICD-10-CM | POA: Diagnosis not present

## 2017-08-01 DIAGNOSIS — R911 Solitary pulmonary nodule: Secondary | ICD-10-CM

## 2017-08-01 DIAGNOSIS — S81802D Unspecified open wound, left lower leg, subsequent encounter: Secondary | ICD-10-CM | POA: Diagnosis not present

## 2017-08-01 DIAGNOSIS — K7290 Hepatic failure, unspecified without coma: Secondary | ICD-10-CM | POA: Diagnosis not present

## 2017-08-01 DIAGNOSIS — K7460 Unspecified cirrhosis of liver: Secondary | ICD-10-CM | POA: Diagnosis not present

## 2017-08-01 DIAGNOSIS — S81801D Unspecified open wound, right lower leg, subsequent encounter: Secondary | ICD-10-CM | POA: Diagnosis not present

## 2017-08-01 DIAGNOSIS — R296 Repeated falls: Secondary | ICD-10-CM | POA: Diagnosis not present

## 2017-08-01 MED ORDER — IOPAMIDOL (ISOVUE-300) INJECTION 61%
75.0000 mL | Freq: Once | INTRAVENOUS | Status: AC | PRN
  Administered 2017-08-01: 75 mL via INTRAVENOUS

## 2017-08-02 DIAGNOSIS — S81801D Unspecified open wound, right lower leg, subsequent encounter: Secondary | ICD-10-CM | POA: Diagnosis not present

## 2017-08-02 DIAGNOSIS — R296 Repeated falls: Secondary | ICD-10-CM | POA: Diagnosis not present

## 2017-08-02 DIAGNOSIS — K7290 Hepatic failure, unspecified without coma: Secondary | ICD-10-CM | POA: Diagnosis not present

## 2017-08-02 DIAGNOSIS — K7460 Unspecified cirrhosis of liver: Secondary | ICD-10-CM | POA: Diagnosis not present

## 2017-08-02 DIAGNOSIS — S81802D Unspecified open wound, left lower leg, subsequent encounter: Secondary | ICD-10-CM | POA: Diagnosis not present

## 2017-08-02 DIAGNOSIS — I10 Essential (primary) hypertension: Secondary | ICD-10-CM | POA: Diagnosis not present

## 2017-08-03 ENCOUNTER — Inpatient Hospital Stay (HOSPITAL_COMMUNITY)
Admission: EM | Admit: 2017-08-03 | Discharge: 2017-08-07 | DRG: 442 | Disposition: A | Discharge status: Skilled Nursing Facility | Payer: Medicare Other | Attending: Internal Medicine | Admitting: Internal Medicine

## 2017-08-03 ENCOUNTER — Encounter (HOSPITAL_COMMUNITY): Payer: Self-pay | Admitting: Emergency Medicine

## 2017-08-03 ENCOUNTER — Other Ambulatory Visit: Payer: Self-pay | Admitting: *Deleted

## 2017-08-03 DIAGNOSIS — B182 Chronic viral hepatitis C: Secondary | ICD-10-CM | POA: Diagnosis not present

## 2017-08-03 DIAGNOSIS — B1920 Unspecified viral hepatitis C without hepatic coma: Secondary | ICD-10-CM | POA: Diagnosis present

## 2017-08-03 DIAGNOSIS — M6281 Muscle weakness (generalized): Secondary | ICD-10-CM | POA: Diagnosis not present

## 2017-08-03 DIAGNOSIS — E875 Hyperkalemia: Secondary | ICD-10-CM | POA: Diagnosis present

## 2017-08-03 DIAGNOSIS — L97228 Non-pressure chronic ulcer of left calf with other specified severity: Secondary | ICD-10-CM | POA: Diagnosis not present

## 2017-08-03 DIAGNOSIS — N179 Acute kidney failure, unspecified: Secondary | ICD-10-CM | POA: Diagnosis not present

## 2017-08-03 DIAGNOSIS — K7460 Unspecified cirrhosis of liver: Secondary | ICD-10-CM | POA: Diagnosis not present

## 2017-08-03 DIAGNOSIS — E7220 Disorder of urea cycle metabolism, unspecified: Secondary | ICD-10-CM | POA: Diagnosis present

## 2017-08-03 DIAGNOSIS — Z79899 Other long term (current) drug therapy: Secondary | ICD-10-CM

## 2017-08-03 DIAGNOSIS — N390 Urinary tract infection, site not specified: Secondary | ICD-10-CM | POA: Diagnosis present

## 2017-08-03 DIAGNOSIS — K7210 Chronic hepatic failure without coma: Secondary | ICD-10-CM | POA: Diagnosis present

## 2017-08-03 DIAGNOSIS — I10 Essential (primary) hypertension: Secondary | ICD-10-CM | POA: Diagnosis present

## 2017-08-03 DIAGNOSIS — K7290 Hepatic failure, unspecified without coma: Secondary | ICD-10-CM | POA: Diagnosis not present

## 2017-08-03 DIAGNOSIS — Z66 Do not resuscitate: Secondary | ICD-10-CM | POA: Diagnosis present

## 2017-08-03 DIAGNOSIS — R4182 Altered mental status, unspecified: Secondary | ICD-10-CM | POA: Insufficient documentation

## 2017-08-03 DIAGNOSIS — T148XXA Other injury of unspecified body region, initial encounter: Secondary | ICD-10-CM

## 2017-08-03 DIAGNOSIS — K922 Gastrointestinal hemorrhage, unspecified: Secondary | ICD-10-CM | POA: Diagnosis not present

## 2017-08-03 DIAGNOSIS — I8510 Secondary esophageal varices without bleeding: Secondary | ICD-10-CM | POA: Diagnosis present

## 2017-08-03 DIAGNOSIS — R402441 Other coma, without documented Glasgow coma scale score, or with partial score reported, in the field [EMT or ambulance]: Secondary | ICD-10-CM | POA: Diagnosis not present

## 2017-08-03 DIAGNOSIS — E871 Hypo-osmolality and hyponatremia: Secondary | ICD-10-CM | POA: Diagnosis present

## 2017-08-03 DIAGNOSIS — N289 Disorder of kidney and ureter, unspecified: Secondary | ICD-10-CM | POA: Diagnosis not present

## 2017-08-03 DIAGNOSIS — K219 Gastro-esophageal reflux disease without esophagitis: Secondary | ICD-10-CM

## 2017-08-03 DIAGNOSIS — K7682 Hepatic encephalopathy: Secondary | ICD-10-CM | POA: Diagnosis present

## 2017-08-03 DIAGNOSIS — G9349 Other encephalopathy: Secondary | ICD-10-CM | POA: Diagnosis not present

## 2017-08-03 DIAGNOSIS — S81802A Unspecified open wound, left lower leg, initial encounter: Secondary | ICD-10-CM | POA: Diagnosis present

## 2017-08-03 DIAGNOSIS — Z515 Encounter for palliative care: Secondary | ICD-10-CM | POA: Diagnosis not present

## 2017-08-03 DIAGNOSIS — S81801A Unspecified open wound, right lower leg, initial encounter: Secondary | ICD-10-CM | POA: Diagnosis present

## 2017-08-03 DIAGNOSIS — Z885 Allergy status to narcotic agent status: Secondary | ICD-10-CM | POA: Diagnosis not present

## 2017-08-03 DIAGNOSIS — K7200 Acute and subacute hepatic failure without coma: Principal | ICD-10-CM | POA: Diagnosis present

## 2017-08-03 DIAGNOSIS — K5900 Constipation, unspecified: Secondary | ICD-10-CM | POA: Diagnosis present

## 2017-08-03 DIAGNOSIS — Z7189 Other specified counseling: Secondary | ICD-10-CM

## 2017-08-03 DIAGNOSIS — W19XXXA Unspecified fall, initial encounter: Secondary | ICD-10-CM | POA: Diagnosis present

## 2017-08-03 DIAGNOSIS — R2689 Other abnormalities of gait and mobility: Secondary | ICD-10-CM | POA: Diagnosis not present

## 2017-08-03 DIAGNOSIS — R278 Other lack of coordination: Secondary | ICD-10-CM | POA: Diagnosis not present

## 2017-08-03 DIAGNOSIS — K7291 Hepatic failure, unspecified with coma: Secondary | ICD-10-CM | POA: Diagnosis not present

## 2017-08-03 DIAGNOSIS — R41841 Cognitive communication deficit: Secondary | ICD-10-CM | POA: Diagnosis not present

## 2017-08-03 LAB — COMPREHENSIVE METABOLIC PANEL
ALK PHOS: 73 U/L (ref 38–126)
ALT: 35 U/L (ref 17–63)
ALT: 33 U/L (ref 17–63)
ANION GAP: 11 (ref 5–15)
AST: 42 U/L — ABNORMAL HIGH (ref 15–41)
AST: 39 U/L (ref 15–41)
Albumin: 3 g/dL — ABNORMAL LOW (ref 3.5–5.0)
Albumin: 3 g/dL — ABNORMAL LOW (ref 3.5–5.0)
Alkaline Phosphatase: 73 U/L (ref 38–126)
Anion gap: 10 (ref 5–15)
BILIRUBIN TOTAL: 1.9 mg/dL — AB (ref 0.3–1.2)
BUN: 16 mg/dL (ref 6–20)
BUN: 17 mg/dL (ref 6–20)
CALCIUM: 8.7 mg/dL — AB (ref 8.9–10.3)
CO2: 18 mmol/L — ABNORMAL LOW (ref 22–32)
CO2: 17 mmol/L — ABNORMAL LOW (ref 22–32)
Calcium: 8.6 mg/dL — ABNORMAL LOW (ref 8.9–10.3)
Chloride: 96 mmol/L — ABNORMAL LOW (ref 101–111)
Chloride: 99 mmol/L — ABNORMAL LOW (ref 101–111)
Creatinine, Ser: 1.47 mg/dL — ABNORMAL HIGH (ref 0.61–1.24)
Creatinine, Ser: 1.44 mg/dL — ABNORMAL HIGH (ref 0.61–1.24)
GFR calc Af Amer: 53 mL/min — ABNORMAL LOW (ref >60)
GFR calc Af Amer: 54 mL/min — ABNORMAL LOW (ref >60)
GFR calc non Af Amer: 46 mL/min — ABNORMAL LOW (ref >60)
GFR, EST NON AFRICAN AMERICAN: 47 mL/min — AB (ref >60)
Glucose, Bld: 85 mg/dL (ref 65–99)
Glucose, Bld: 99 mg/dL (ref 65–99)
POTASSIUM: 4.7 mmol/L (ref 3.5–5.1)
Potassium: 5.3 mmol/L — ABNORMAL HIGH (ref 3.5–5.1)
Sodium: 124 mmol/L — ABNORMAL LOW (ref 135–145)
Sodium: 127 mmol/L — ABNORMAL LOW (ref 135–145)
TOTAL PROTEIN: 5.5 g/dL — AB (ref 6.5–8.1)
Total Bilirubin: 1.9 mg/dL — ABNORMAL HIGH (ref 0.3–1.2)
Total Protein: 5.5 g/dL — ABNORMAL LOW (ref 6.5–8.1)

## 2017-08-03 LAB — CBC
HCT: 36.6 % — ABNORMAL LOW (ref 39.0–52.0)
Hemoglobin: 12.8 g/dL — ABNORMAL LOW (ref 13.0–17.0)
MCH: 31.5 pg (ref 26.0–34.0)
MCHC: 35 g/dL (ref 30.0–36.0)
MCV: 90.1 fL (ref 78.0–100.0)
Platelets: 69 10*3/uL — ABNORMAL LOW (ref 150–400)
RBC: 4.06 MIL/uL — ABNORMAL LOW (ref 4.22–5.81)
RDW: 15 % (ref 11.5–15.5)
WBC: 5.1 10*3/uL (ref 4.0–10.5)

## 2017-08-03 LAB — URINALYSIS, ROUTINE W REFLEX MICROSCOPIC
Bilirubin Urine: NEGATIVE
Glucose, UA: NEGATIVE mg/dL
Ketones, ur: NEGATIVE mg/dL
Nitrite: NEGATIVE
Protein, ur: 100 mg/dL — AB
Specific Gravity, Urine: 1.018 (ref 1.005–1.030)
Squamous Epithelial / LPF: NONE SEEN
pH: 5 (ref 5.0–8.0)

## 2017-08-03 LAB — RAPID URINE DRUG SCREEN, HOSP PERFORMED
Amphetamines: NOT DETECTED
Barbiturates: NOT DETECTED
Benzodiazepines: NOT DETECTED
Cocaine: NOT DETECTED
Opiates: NOT DETECTED
Tetrahydrocannabinol: NOT DETECTED

## 2017-08-03 LAB — AMMONIA
AMMONIA: 88 umol/L — AB (ref 9–35)
Ammonia: 138 umol/L — ABNORMAL HIGH (ref 9–35)

## 2017-08-03 LAB — ETHANOL: Alcohol, Ethyl (B): <10 mg/dL (ref <10)

## 2017-08-03 MED ORDER — POLYETHYL GLYCOL-PROPYL GLYCOL 0.4-0.3 % OP GEL
Freq: Every day | OPHTHALMIC | Status: DC
  Filled 2017-08-03: qty 10

## 2017-08-03 MED ORDER — LACTULOSE 10 GM/15ML PO SOLN
30.0000 g | Freq: Four times a day (QID) | ORAL | Status: DC
  Administered 2017-08-03 – 2017-08-07 (×16): 30 g via ORAL
  Filled 2017-08-03 (×19): qty 45

## 2017-08-03 MED ORDER — ONDANSETRON HCL 4 MG PO TABS: 4.0000 mg | ORAL_TABLET | Freq: Four times a day (QID) | ORAL | Status: DC | PRN

## 2017-08-03 MED ORDER — ACETAMINOPHEN 650 MG RE SUPP: 650.0000 mg | Freq: Four times a day (QID) | RECTAL | Status: DC | PRN

## 2017-08-03 MED ORDER — SPIRONOLACTONE 100 MG PO TABS
300.0000 mg | ORAL_TABLET | Freq: Every day | ORAL | Status: DC
  Filled 2017-08-03: qty 3

## 2017-08-03 MED ORDER — PANTOPRAZOLE SODIUM 40 MG PO TBEC
40.0000 mg | DELAYED_RELEASE_TABLET | Freq: Every day | ORAL | Status: DC
  Administered 2017-08-03 – 2017-08-07 (×5): 40 mg via ORAL
  Filled 2017-08-03 (×5): qty 1

## 2017-08-03 MED ORDER — SODIUM CHLORIDE 0.9 % IV SOLN
INTRAVENOUS | Status: DC
  Administered 2017-08-03 – 2017-08-04 (×2): via INTRAVENOUS

## 2017-08-03 MED ORDER — AMPHETAMINE-DEXTROAMPHETAMINE 20 MG PO TABS: 20.0000 mg | ORAL_TABLET | Freq: Every day | ORAL | Status: DC

## 2017-08-03 MED ORDER — POLYVINYL ALCOHOL 1.4 % OP SOLN
1.0000 [drp] | Freq: Every day | OPHTHALMIC | Status: DC
  Administered 2017-08-03 – 2017-08-06 (×4): 1 [drp] via OPHTHALMIC
  Filled 2017-08-03 (×2): qty 15

## 2017-08-03 MED ORDER — FUROSEMIDE 40 MG PO TABS
40.0000 mg | ORAL_TABLET | Freq: Every day | ORAL | Status: DC
  Administered 2017-08-03: 40 mg via ORAL
  Filled 2017-08-03: qty 2

## 2017-08-03 MED ORDER — RIFAXIMIN 200 MG PO TABS
400.0000 mg | ORAL_TABLET | Freq: Three times a day (TID) | ORAL | Status: DC
  Administered 2017-08-03 – 2017-08-07 (×12): 400 mg via ORAL
  Filled 2017-08-03 (×15): qty 2

## 2017-08-03 MED ORDER — ACETAMINOPHEN 325 MG PO TABS: 650.0000 mg | ORAL_TABLET | Freq: Four times a day (QID) | ORAL | Status: DC | PRN

## 2017-08-03 MED ORDER — LACTULOSE 10 GM/15ML PO SOLN
30.0000 g | Freq: Once | ORAL | Status: AC
  Administered 2017-08-03: 30 g via ORAL
  Filled 2017-08-03: qty 45

## 2017-08-03 MED ORDER — ONDANSETRON HCL 4 MG/2ML IJ SOLN: 4.0000 mg | Freq: Four times a day (QID) | INTRAMUSCULAR | Status: DC | PRN

## 2017-08-03 NOTE — Progress Notes (Signed)
Patient trasfered from ED to [phone] via stretcher; alert and oriented x 2, confused; no complaints of pain; IV in LH running NS@75cc /hr; Orient patient to room and unit; sister at bedside; gave patient and his sister care guide; instructed how to use the call bell and  fall risk precautions. Will continue to monitor the patient.

## 2017-08-03 NOTE — ED Notes (Signed)
Attempted In and Out. Hit resistance and unable to advance. Pt being placed on a condom cath for urine sample.

## 2017-08-03 NOTE — H&P (Signed)
History and Physical    Joshua [name] YYT:[id number] DOB: 27-Nov-1945 DOA: 08/03/2017  PCP: [name], MD Patient coming from: home  Chief Complaint: Confusion  HPI: Joshua [name] is a 71 y.o. male with medical history significant of CIrrhosis, anemia, Hepatitis C, HTN.   Of note patient was discharged from his current hospital 07/21/2017 after a 3 day admission for acute hepatic encephalopathy secondary to elevated ammonia levels. Since time of discharge patient's mental status has never been completely back to his baseline. Level V caveat applies this patient has acutely altered. History provided per [name] by patient's sister who is his primary caregiver. Patient has had waxing and waning periods of mentation since discharge. Family was in the process of getting patient placed in an assisted living facility prior to current admission. Patient's mentation definitely is affected by certain medications such as Ambien which make it worse and disruption in his normal daily process. Patient has had issues recently with inappropriate behavior in public, eating spoiled food that the patient was normal, and other general confusion type symptoms. There are no reported fevers, chest pain, abdominal pain, dysuria, frequency, flank pain, shortness breath, palpitations, rash. Patient sister assists patient in education administration and states compliance. Lately they have been giving him 45 ML's or 30 g of lactulose 3 times a day. He has not had a bowel movement in greater than one day prior to admission. 24 hours prior to admission patient became significantly more confused with no improvement after sleeping.  ED Course: Objective findings outlined below. Lactulose administered.  Review of Systems: As per HPI otherwise all other systems reviewed and are negative  Ambulatory Status: Ambulates without difficulty but due to confusion recently unable to perform ADLs.  Past Medical History:    Diagnosis Date  . Anemia    low platelets/liver disease  . Arthritis   . Cirrhosis ([place])   . Hepatitis C   . Hypertension   . Wears glasses   . Wears partial dentures    top partial    Past Surgical History:  Procedure Laterality Date  . APPENDECTOMY    . COLONOSCOPY    . EYE SURGERY     both cataracts  . PILONIDAL CYST EXCISION  1960  . SHOULDER ARTHROSCOPY WITH SUBACROMIAL DECOMPRESSION, ROTATOR CUFF REPAIR AND BICEP TENDON REPAIR Left 08/09/2013   Procedure: LEFT SHOULDER ARTHROSCOPY WITH SUBACROMIAL DECOMPRESSION, DISTAL CLAVICULECTOMY;  Surgeon: Ninetta Lights, MD;  Location: [facility];  Service: Orthopedics;  Laterality: Left;  . TONSILLECTOMY    . UPPER GI ENDOSCOPY      Social History   Social History  . Marital status: Single    Spouse name: N/A  . Number of children: N/A  . Years of education: N/A   Occupational History  . Not on file.   Social History Main Topics  . Smoking status: Light Tobacco Smoker  . Smokeless tobacco: Current User  . Alcohol use Yes     Comment: "one beer a week"  . Drug use: Yes    Types: Marijuana  . Sexual activity: Yes     Comment: smoking every few days-to weeks   Other Topics Concern  . Not on file   Social History Narrative  . No narrative on file    Allergies  Allergen Reactions  . Codeine Itching    Family History  Problem Relation Age of Onset  . Cirrhosis Mother   . Cirrhosis Father   . CAD Neg Hx  Prior to Admission medications   Medication Sig Start Date End Date Taking? Authorizing Provider  amphetamine-dextroamphetamine (ADDERALL) 10 MG tablet Take 20 mg by mouth daily.  06/10/12   [provider]  Docusate Calcium (STOOL SOFTENER PO) Take 1 tablet by mouth daily as needed (constipation).     [provider]  furosemide (LASIX) 40 MG tablet Take 40 mg by mouth daily. 06/28/17   [provider]  HYDROcodone-acetaminophen (NORCO) 10-325 MG tablet Take  0.5-1 tablets by mouth every 8 (eight) hours as needed. 05/20/17   [provider]  lactulose (CHRONULAC) 10 GM/15ML solution Take 30 g by mouth 3 (three) times daily. 05/24/14   Caren Griffins, MD  Multiple Vitamins-Minerals (MULTIVITAMIN WITH MINERALS) tablet Take 1 tablet by mouth daily.    [provider]  pantoprazole (PROTONIX) 40 MG tablet Take 40 mg by mouth daily. 06/28/17   [provider]  Polyethyl Glycol-Propyl Glycol (SYSTANE OP) Place 1 drop into both eyes at bedtime.    [provider]  SODIUM FLUORIDE, DENTAL RINSE, (PREVIDENT) 0.2 % SOLN Place onto teeth See admin instructions. Rinse with small amount for one minute and then spit; do not eat, drink or rinse for 30 minutes after using    [provider]  spironolactone (ALDACTONE) 100 MG tablet Take 1 tablet (100 mg total) by mouth 2 (two) times daily. Patient taking differently: Take 300 mg by mouth daily.  05/24/14   Caren Griffins, MD  sulfamethoxazole-trimethoprim (BACTRIM DS,SEPTRA DS) 800-160 MG tablet Take 1 tablet by mouth 2 (two) times daily.    [provider]  zolpidem (AMBIEN) 5 MG tablet Take 5 mg by mouth at bedtime. 06/07/17   [provider]    Physical Exam: Vitals:   08/03/17 1045 08/03/17 1115 08/03/17 1145 08/03/17 1200  BP: (!) 146/64 139/65 121/64 (!) 139/57  Pulse: 78 78 86 85  Resp: 13 17 20 14   Temp:      TempSrc:      SpO2: 100% 100% 98% 100%     General: Calm, sleepy, is resting in bed. Eyes:  PERRL, EOMI, normal lids, iris ENT:  grossly normal hearing, lips & tongue, mmm Neck:  no LAD, masses or thyromegaly Cardiovascular:  RRR, no m/r/g. 3+ bilateral lower extremity pitting edema Respiratory:  CTA bilaterally, no w/r/r. Normal respiratory effort. Abdomen: Distended, hypoactive bowel sounds, nontender to palpation Skin:  Typical venous stasis dermatitis type changes to the bilateral lower extremity. No apparent cellulitis. Right  anterior lower extremity cavitary lesion with central packing appears to be healing well. Anterior left lower extremity wound with perpendicular, L-shaped laceration with small area of central eschar. No evidence of surrounding cellulitis, significant erythema or purulence,  Musculoskeletal:  grossly normal tone BUE/BLE, good ROM, no bony abnormality Psychiatric: Follows basic commands. Alert and oriented 1. Pleasant. Sleepy. Neurologic:  CN 2-12 grossly intact, moves all extremities in coordinated fashion, sensation intact  Labs on Admission: I have personally reviewed following labs and imaging studies  CBC:  Recent Labs Lab 08/03/17 0928  WBC 5.1  HGB 12.8*  HCT 36.6*  MCV 90.1  PLT 69*   Basic Metabolic Panel:  Recent Labs Lab 08/03/17 0928  NA 124*  K 5.3*  CL 96*  CO2 18*  GLUCOSE 85  BUN 16  CREATININE 1.47*  CALCIUM 8.6*   GFR: Estimated Creatinine Clearance: 40 mL/min (A) (by C-G formula based on SCr of 1.47 mg/dL (H)). Liver Function Tests:  Recent Labs Lab  08/03/17 0928  AST 42*  ALT 35  ALKPHOS 73  BILITOT 1.9*  PROT 5.5*  ALBUMIN 3.0*   No results for input(s): LIPASE, AMYLASE in the last 168 hours.  Recent Labs Lab 08/03/17 0917  AMMONIA 138*   Coagulation Profile: No results for input(s): INR, PROTIME in the last 168 hours. Cardiac Enzymes: No results for input(s): CKTOTAL, CKMB, CKMBINDEX, TROPONINI in the last 168 hours. BNP (last 3 results) No results for input(s): PROBNP in the last 8760 hours. HbA1C: No results for input(s): HGBA1C in the last 72 hours. CBG: No results for input(s): GLUCAP in the last 168 hours. Lipid Profile: No results for input(s): CHOL, HDL, LDLCALC, TRIG, CHOLHDL, LDLDIRECT in the last 72 hours. Thyroid Function Tests: No results for input(s): TSH, T4TOTAL, FREET4, T3FREE, THYROIDAB in the last 72 hours. Anemia Panel: No results for input(s): VITAMINB12, FOLATE, FERRITIN, TIBC, IRON, RETICCTPCT in the last  72 hours. Urine analysis:    Component Value Date/Time   COLORURINE YELLOW 08/03/2017 1048   APPEARANCEUR CLOUDY (A) 08/03/2017 1048   LABSPEC 1.018 08/03/2017 1048   PHURINE 5.0 08/03/2017 1048   GLUCOSEU NEGATIVE 08/03/2017 1048   HGBUR MODERATE (A) 08/03/2017 1048   BILIRUBINUR NEGATIVE 08/03/2017 1048   KETONESUR NEGATIVE 08/03/2017 1048   PROTEINUR 100 (A) 08/03/2017 1048   UROBILINOGEN 1.0 08/23/2014 1839   NITRITE NEGATIVE 08/03/2017 1048   LEUKOCYTESUR LARGE (A) 08/03/2017 1048    Creatinine Clearance: Estimated Creatinine Clearance: 40 mL/min (A) (by C-G formula based on SCr of 1.47 mg/dL (H)).  Sepsis Labs: @LABRCNTIP (procalcitonin:4,lacticidven:4) )No results found for this or any previous visit (from the past 240 hour(s)).   Radiological Exams on Admission: No results found.  EKG: Independently reviewed. Sinus, no ACS  Assessment/Plan Active Problems:   Cirrhosis (HCC)   Hepatitis C   HTN (hypertension)   Hyperammonemia (HCC)   Acute hepatic encephalopathy   Hyponatremia   Nonhealing nonsurgical wound   AKI (acute kidney injury) ([place])   GERD (gastroesophageal reflux disease)   Acute on Chronic hepatic encephalopathy: Ammonia 138 which is the highest it's ever been on our records. States compliance with home lactulose medication but decreasing bowel movements prior to admission. Patient's mental status never fully recovered after previous admission with discharge on 07/21/2017. Patient impacted significantly with even slight changes in medications, sedating medication use, change daily routine.  Doubt any contributing factors such as infection, acute intracranial process, seizure.  - Increase Lactulose 30g to QID (may need enema if not improving).  - Add Xifaxan - Ammonia at 1900 and 0500 if mentation not improving.   LE wounds: Bilat. L>R. R w/ poor wound healing and central eschar. No evidence of superinfection. REcently finished Bactrim in outpt setting.    - CCS consult for possible debridement of LLE wound and allow to heal by secondary intension vs conservative mgt w/ continued daily dressing changes - Wet to dry daily dressing changes - Consider Unna boot if edema persists and wounds worsen - No further Bactrim.   Hyponatremia/hypochloremia/hyperkalemia: likely from advanced Cirrhosis, AKI and poor oral intake.  - IVF NS - BMET 1900 and 05:00.  Cirrhosis: Due to Hep C. MELD score 25 (77mo 19.6% mortality). - Continue spironolactone, lasix - Palliative and social work consults for placement and GOC. Pts sister is the POA and very reasonable. Aware that pt continues to decondition  AKI: likely from poor oral intake due to altered mentation vs ongoing hepatorenal congestion from cirrhossis - IVF - BMP in am  GERD: - continue protonix  ADD: - continue Adderall    DVT prophylaxis: None  Code Status: full  Family Communication: sister  Disposition Plan: pending improvement  Consults called: CCS for loeg wounds  Admission status: inpt - med surge    Waldemar Dickens MD [facility]  If 7PM-7AM, please contact night-coverage [email or web address] Password [email or web address]  08/03/2017, 12:08 PM

## 2017-08-03 NOTE — ED Triage Notes (Signed)
Pt to ER coming from home where patient has frequent family over to monitor him, however family does not live with patient permanently. Pt sister called EMS today due to worsening, recurrent altered mental status. Hx of cirrhosis with recurrent elevated ammonia levels. Pt sister states unsure of patient compliance with lactulose. VSS at this time. Pt baseline a/o x4 at baseline, however today is alert to self and place. Follows commands. Sister at bedside, is POA.

## 2017-08-03 NOTE — ED Notes (Signed)
MD Merrell at the bedside  

## 2017-08-03 NOTE — Patient Outreach (Addendum)
[facility]) Care Management  08/03/2017  Joshua [name] Feb 24, 1946 [id number]  Patient was readmitted to hospital today 10/10 with altered mental status per ED note. DX: Hepatic encephalopathy, Hyperammonemia, Hyponatremia. Previous admission: 9/24-9/27/2018  Per last contact with patient's sister who is Comptroller the plan was for patient to move to assisted living at [facility] today.    Per medical record note today-patient currently needs higher level of care currently.  Plan: Close out Transition of care calls. Notify [facility] hospital liaisons & CSW of patient's readmission.  [name], RN BSN [place] Management Coordinator [facility] Care Management  [phone]

## 2017-08-03 NOTE — ED Notes (Signed)
Provider at the bedside.  

## 2017-08-03 NOTE — ED Provider Notes (Signed)
[facility] DEPT Provider Note   CSN: [id number] Arrival date & time: 08/03/17  0920     History   Chief Complaint Chief Complaint  Patient presents with  . Altered Mental Status    HPI Joshua [name] is a 71 y.o. male.  HPI   71 year old male presenting with his sister who reports she is his medical power of attorney. She reports increasing confusion and weakness for the past several days. Has a past history of chronic liver disease secondary to hepatitis C. Recent admission on 9/24-9/27/18 with acute hepatic encephalopathy after falling and sustaining leg wounds.  He was discharged home. His sister has been staying with him since that time. He seemed confused about his medications after he came home from the hospital and she reports that she has been administering them to him since then. He does have a past history of alcohol use. She does not think that he has been drinking recently though. Apparently he was recently prescribed Bactrim by his PCP. It is not clear to me though if this was to treat possible cellulitis of LE, for UTI or for other indication.  Patient himself is unable to find much useful history. He does tell me he is here for "my ammonia."  Past Medical History:  Diagnosis Date  . Anemia    low platelets/liver disease  . Arthritis   . Cirrhosis ([place])   . Hepatitis C   . Hypertension   . Wears glasses   . Wears partial dentures    top partial    Patient Active Problem List   Diagnosis Date Noted  . Acute hepatic encephalopathy 07/18/2017  . Hyponatremia 07/18/2017  . Pulmonary nodules 07/18/2017  . Hepatic encephalopathy ([place]) 07/18/2017  . Hyperammonemia ([place]) 05/22/2014  . Acute encephalopathy 05/22/2014  . Renal failure (ARF), acute on chronic ([place]) 05/22/2014  . Cellulitis 02/17/2014  . Cirrhosis ([place]) 02/17/2014  . Hepatitis C 02/17/2014  . HTN (hypertension) 02/17/2014    Past Surgical History:  Procedure Laterality Date  .  APPENDECTOMY    . COLONOSCOPY    . EYE SURGERY     both cataracts  . PILONIDAL CYST EXCISION  1960  . SHOULDER ARTHROSCOPY WITH SUBACROMIAL DECOMPRESSION, ROTATOR CUFF REPAIR AND BICEP TENDON REPAIR Left 08/09/2013   Procedure: LEFT SHOULDER ARTHROSCOPY WITH SUBACROMIAL DECOMPRESSION, DISTAL CLAVICULECTOMY;  Surgeon: Ninetta Lights, MD;  Location: [facility];  Service: Orthopedics;  Laterality: Left;  . TONSILLECTOMY    . UPPER GI ENDOSCOPY         Home Medications    Prior to Admission medications   Medication Sig Start Date End Date Taking? Authorizing Provider  amphetamine-dextroamphetamine (ADDERALL) 10 MG tablet Take 20 mg by mouth daily.  06/10/12   [provider]  Docusate Calcium (STOOL SOFTENER PO) Take 1 tablet by mouth daily as needed (constipation).     [provider]  furosemide (LASIX) 40 MG tablet Take 40 mg by mouth daily. 06/28/17   [provider]  HYDROcodone-acetaminophen (NORCO) 10-325 MG tablet Take 0.5-1 tablets by mouth every 8 (eight) hours as needed. 05/20/17   [provider]  lactulose (CHRONULAC) 10 GM/15ML solution Take 30 g by mouth 3 (three) times daily. 05/24/14   Caren Griffins, MD  Multiple Vitamins-Minerals (MULTIVITAMIN WITH MINERALS) tablet Take 1 tablet by mouth daily.    [provider]  pantoprazole (PROTONIX) 40 MG tablet Take 40 mg by mouth daily. 06/28/17   [provider]  Polyethyl  Glycol-Propyl Glycol (SYSTANE OP) Place 1 drop into both eyes at bedtime.    [provider]  SODIUM FLUORIDE, DENTAL RINSE, (PREVIDENT) 0.2 % SOLN Place onto teeth See admin instructions. Rinse with small amount for one minute and then spit; do not eat, drink or rinse for 30 minutes after using    [provider]  spironolactone (ALDACTONE) 100 MG tablet Take 1 tablet (100 mg total) by mouth 2 (two) times daily. Patient taking differently: Take 300 mg by mouth daily.  05/24/14    Caren Griffins, MD  sulfamethoxazole-trimethoprim (BACTRIM DS,SEPTRA DS) 800-160 MG tablet Take 1 tablet by mouth 2 (two) times daily.    [provider]  zolpidem (AMBIEN) 5 MG tablet Take 5 mg by mouth at bedtime. 06/07/17   [provider]    Family History Family History  Problem Relation Age of Onset  . Cirrhosis Mother   . Cirrhosis Father   . CAD Neg Hx     Social History Social History  Substance Use Topics  . Smoking status: Light Tobacco Smoker  . Smokeless tobacco: Current User  . Alcohol use Yes     Comment: "one beer a week"     Allergies   Codeine   Review of Systems Review of Systems  Level 5 caveat because of encephalopathy.   Physical Exam Updated Vital Signs BP 131/61   Pulse 76   Temp 98.9 F (37.2 C) (Oral)   Resp 14   SpO2 100%   Physical Exam  Constitutional: He appears well-developed and well-nourished. No distress.  HENT:  Head: Normocephalic and atraumatic.  Eyes: Conjunctivae are normal. Right eye exhibits no discharge. Left eye exhibits no discharge.  Neck: Neck supple.  Cardiovascular: Normal rate, regular rhythm and normal heart sounds.  Exam reveals no gallop and no friction rub.   No murmur heard. Pulmonary/Chest: Effort normal and breath sounds normal. No respiratory distress.  Abdominal: Soft. He exhibits no distension. There is no tenderness.  Musculoskeletal: He exhibits edema. He exhibits no tenderness.  Symmetric or extremity edema. Chronic appearing skin changes consistent with stasis dermatitis. Wounds to bilateral lower extremities anteriorly. Neither appear acutely infected. Black eschar over part of the flap of the left leg wound. No drainage.  Neurological:  Laying in bed. Eyes open. Confused. Disoriented to place and time. Will follow some simple commands. No focal weakness. [place].  Skin: Skin is warm and dry.  Psychiatric: He has a normal mood and affect. His behavior is normal. Thought  content normal.  Nursing note and vitals reviewed.    ED Treatments / Results  Labs (all labs ordered are listed, but only abnormal results are displayed) Labs Reviewed  COMPREHENSIVE METABOLIC PANEL - Abnormal; Notable for the following:       Result Value   Sodium 124 (*)    Potassium 5.3 (*)    Chloride 96 (*)    CO2 18 (*)    Creatinine, Ser 1.47 (*)    Calcium 8.6 (*)    Total Protein 5.5 (*)    Albumin 3.0 (*)    AST 42 (*)    Total Bilirubin 1.9 (*)    GFR calc non Af Amer 46 (*)    GFR calc Af Amer 53 (*)    All other components within normal limits  CBC - Abnormal; Notable for the following:    RBC 4.06 (*)    Hemoglobin 12.8 (*)    HCT 36.6 (*)    Platelets  69 (*)    All other components within normal limits  AMMONIA - Abnormal; Notable for the following:    Ammonia 138 (*)    All other components within normal limits  URINALYSIS, ROUTINE W REFLEX MICROSCOPIC  ETHANOL  RAPID URINE DRUG SCREEN, HOSP PERFORMED    EKG  EKG Interpretation  Date/Time:  Wednesday August 03 2017 09:23:02 EDT Ventricular Rate:  72 PR Interval:    QRS Duration: 91 QT Interval:  417 QTC Calculation: 457 R Axis:   50 Text Interpretation:  Sinus rhythm Prolonged PR interval Low voltage, precordial leads Anteroseptal infarct, old No significant change since last tracing Confirmed by Virgel Manifold [phone]) on 08/03/2017 9:52:30 AM       Radiology No results found.  Procedures Procedures (including critical care time)  Medications Ordered in ED Medications - No data to display   Initial Impression / Assessment and Plan / ED Course  I have reviewed the triage vital signs and the nursing notes.  Pertinent labs & imaging results that were available during my care of the patient were reviewed by me and considered in my medical decision making (see chart for details).     71 year old male with increasing confusion. My initial impression this is secondary to hepatic  encephalopathy. He is afebrile, but will check a rectal temperature. Recent admission with similar symptoms. Urine drug screen and ethanol levels were normal. Probably low yield, but will recheck. Will check urinalysis. Apparently is on Bactrim although I'm not quite sure of the exact indication for it.  His leg edema looks similar to me as pictured in H&P from recent admission. Skin changes appear to be stasis dermatitis. I feel that cellulitis is less likely. His sister is really concerned about his leg wounds.  The flap of the L lower leg wound does have an eschar which may benefit from debridement, but overall they do not appear infected.   He is currently living at home. Current panm to place in an assisted living. In his current state I feel that even assisted living is not appropriate. Will likely need a higher level of care. With current level of encephalopathy, he will require admission.  Ammonia 138. Lactulose ordered. This should help with mild hyperkalemia as well. Worsened hyponatremia which is chronic to some degree and likely 2/2 cirrhosis. Straight cath attempted for UA but resistance.  Condom cath placed. Bladder not palpably distended and BUN normal.   Final Clinical Impressions(s) / ED Diagnoses   Final diagnoses:  Hepatic encephalopathy ([place])  Hyperammonemia ([place])  Hyponatremia    New Prescriptions New Prescriptions   No medications on file     Virgel Manifold, MD 08/03/17 1050

## 2017-08-03 NOTE — Clinical Social Work Note (Signed)
Clinical Social Work Assessment  Patient Details  Name: Joshua [name] MRN: [id number] Date of Birth: [birth year]  Date of referral:  08/03/17               Reason for consult:  Facility Placement                Permission sought to share information with:  Family Supports Permission granted to share information::  Yes, Verbal Permission Granted  Name::     Joshua [name]  (pt's sister )  Agency::     Relationship::     Contact Information:     Housing/Transportation Living arrangements for the past 2 months:  Single Family Home (alone. ) Source of Information:  Other (Comment Required) (pt's sister. ) Patient Interpreter Needed:  None Criminal Activity/Legal Involvement Pertinent to Current Situation/Hospitalization:  No - Comment as needed Significant Relationships:  Siblings Lives with:  Self Do you feel safe going back to the place where you live?  No Need for family participation in patient care:  Yes (Comment)  Care giving concerns:  CSW spoke with pt's sister at bedside. During this time pt's sister is very upset about a number of things regarding pt.    Social Worker assessment / plan:  CSW spoke with pt at bedside. During this time CSW was informed that pt is from home alone and just started receiving [place] as of Saturday. Pt's sister Joshua [name]) expressed concerns about the care taht pt received while here in the hospital on last admission. Pt's sister has paper work that suggests that brother and her are pt's POA. Joshua [name] is seeking placement for pt and mentioned that she doesn't care about insurance or anything other than pt getting the care that pt is needing. CSW tried to explain barriers to placement if any, however sister was not interested in hearing CSW suggestions at this time.   Employment status:  Retired Health visitor (only part A.) PT Recommendations:  Not assessed at this time Information / Referral to community resources:  [place]  Patient/Family's Response to care:  Pt's sister is very frustrated and not happy with [facility] hospital. She is understanding that pt may need a new level of care and is agreeable to SNF placement at this time.   Patient/Family's Understanding of and Emotional Response to Diagnosis, Current Treatment, and Prognosis:  No further questions or concerns have been presented to CSW at this time.   Emotional Assessment Appearance:  Appears stated age Attitude/Demeanor/Rapport:  Lethargic Affect (typically observed):  Unable to Assess Orientation:   (pt is having AMS. ) Alcohol / Substance use:  Not Applicable Psych involvement (Current and /or in the community):  No (Comment)  Discharge Needs  Concerns to be addressed:  No discharge needs identified Readmission within the last 30 days:  No Current discharge risk:  None, Lives alone Barriers to Discharge:  No Barriers Identified   Wetzel Bjornstad, [place] 08/03/2017, 1:39 PM

## 2017-08-04 ENCOUNTER — Inpatient Hospital Stay (HOSPITAL_COMMUNITY): Payer: Medicare Other

## 2017-08-04 DIAGNOSIS — Z7189 Other specified counseling: Secondary | ICD-10-CM

## 2017-08-04 DIAGNOSIS — Z515 Encounter for palliative care: Secondary | ICD-10-CM

## 2017-08-04 DIAGNOSIS — K7290 Hepatic failure, unspecified without coma: Secondary | ICD-10-CM

## 2017-08-04 LAB — CBC
HEMATOCRIT: 33.1 % — AB (ref 39.0–52.0)
Hemoglobin: 11.3 g/dL — ABNORMAL LOW (ref 13.0–17.0)
MCH: 30.7 pg (ref 26.0–34.0)
MCHC: 34.1 g/dL (ref 30.0–36.0)
MCV: 89.9 fL (ref 78.0–100.0)
Platelets: 73 10*3/uL — ABNORMAL LOW (ref 150–400)
RBC: 3.68 MIL/uL — ABNORMAL LOW (ref 4.22–5.81)
RDW: 15.1 % (ref 11.5–15.5)
WBC: 4.6 10*3/uL (ref 4.0–10.5)

## 2017-08-04 LAB — COMPREHENSIVE METABOLIC PANEL
ALK PHOS: 65 U/L (ref 38–126)
ALT: 29 U/L (ref 17–63)
ANION GAP: 9 (ref 5–15)
AST: 37 U/L (ref 15–41)
Albumin: 2.6 g/dL — ABNORMAL LOW (ref 3.5–5.0)
BILIRUBIN TOTAL: 1.9 mg/dL — AB (ref 0.3–1.2)
BUN: 15 mg/dL (ref 6–20)
CALCIUM: 8.2 mg/dL — AB (ref 8.9–10.3)
CO2: 18 mmol/L — ABNORMAL LOW (ref 22–32)
Chloride: 101 mmol/L (ref 101–111)
Creatinine, Ser: 1.3 mg/dL — ABNORMAL HIGH (ref 0.61–1.24)
GFR calc non Af Amer: 53 mL/min — ABNORMAL LOW (ref >60)
Glucose, Bld: 80 mg/dL (ref 65–99)
Potassium: 4.6 mmol/L (ref 3.5–5.1)
SODIUM: 128 mmol/L — AB (ref 135–145)
TOTAL PROTEIN: 4.7 g/dL — AB (ref 6.5–8.1)

## 2017-08-04 LAB — AMMONIA: Ammonia: 139 umol/L — ABNORMAL HIGH (ref 9–35)

## 2017-08-04 MED ORDER — SODIUM CHLORIDE 0.9 % IV SOLN
INTRAVENOUS | Status: DC
Start: 2017-08-04 — End: 2017-08-05
  Administered 2017-08-04 – 2017-08-05 (×2): via INTRAVENOUS

## 2017-08-04 MED ORDER — LACTULOSE ENEMA
300.0000 mL | Freq: Once | ORAL | Status: AC
  Administered 2017-08-04: 300 mL via RECTAL
  Filled 2017-08-04: qty 300

## 2017-08-04 MED ORDER — COLLAGENASE 250 UNIT/GM EX OINT
TOPICAL_OINTMENT | Freq: Every day | CUTANEOUS | Status: DC
  Administered 2017-08-04 – 2017-08-07 (×4): via TOPICAL
  Filled 2017-08-04: qty 30

## 2017-08-04 MED ORDER — LACTULOSE ENEMA: 300.0000 mL | Freq: Once | ORAL | Status: DC

## 2017-08-04 NOTE — Evaluation (Signed)
Occupational Therapy Evaluation Patient Details Name: Joshua [name] MRN: [id number] DOB: 1945/12/11 Today's Date: 08/04/2017    History of Present Illness Joshua [name] is a 71 y.o. male admitted with increasing confusion secondary to Acute on Chronic hepatic encephalopathy. Pt was discharged from his current hospital 07/21/2017 after a 3 day admission for acute hepatic encephalopathy secondary to elevated ammonia levels. Since time of discharge patient's mental status has never been completely back to his baseline. Level V caveat applies this patient has acutely altered. History provided per [name] by patient's sister who is his primary caregiver. Patient has had waxing and waning periods of mentation since discharge. Prior medical history significant of CIrrhosis, anemia, Hepatitis C, HTN   Clinical Impression   This 71 y/o M presents with the above. Prior to Pt's hospital admission on 9/25 Pt was independent with ADLs and functional mobility. Pt presents with cognitive deficits which significantly impact his ability to complete ADLs and functional mobility; requires ModA for stand pivot transfers, Mod-MaxA for UB ADLs, total assist for LB ADLs. Pt requires Max multimodal cues throughout session to complete tasks and process/follow simple commands. Pt will benefit from continued acute OT services and recommend additional OT services in SNF setting after discharge to progress Pt's cognition, safety, and independence with ADLs and mobility.     Follow Up Recommendations  SNF;Supervision/Assistance - 24 hour    Equipment Recommendations  Other (comment) (defer to next venue )           Precautions / Restrictions Precautions Precautions: Fall Restrictions Weight Bearing Restrictions: No      Mobility Bed Mobility Overal bed mobility: Needs Assistance Bed Mobility: Sit to Supine     Supine to sit: Min assist Sit to supine: Min guard   General bed mobility comments: Pt able to  advance LEs onto bed and return to supine with verbal cues; verbal cues to position in bed however Pt able to complete without physical assist  Transfers Overall transfer level: Needs assistance Equipment used: 1 person hand held assist Transfers: Stand Pivot Transfers;Sit to/from Stand Sit to Stand: Mod assist Stand pivot transfers: Mod assist       General transfer comment: Pt initially with posterior lean upon sit<>stand, requires Max multimodal cues and repetition to complete stand pivot from recliner to EOB; Pt initially resisting sitting, requires increased time and repetition to process directions     Balance Overall balance assessment: Needs assistance Sitting-balance support: Feet supported Sitting balance-Leahy Scale: Fair     Standing balance support: Bilateral upper extremity supported Standing balance-Leahy Scale: Poor Standing balance comment: requires min-modA to maintain standing balance                           ADL either performed or assessed with clinical judgement   ADL Overall ADL's : Needs assistance/impaired Eating/Feeding: Minimal assistance;Sitting   Grooming: Maximal assistance;Wash/dry face;Oral care;Sitting Grooming Details (indicate cue type and reason): MaxA for attempts to complete oral care, requires [facility] assist; Pt able to wash face when given wash cloth though requires verbal cues to complete task  Upper Body Bathing: Moderate assistance;Sitting   Lower Body Bathing: Maximal assistance;Sit to/from stand   Upper Body Dressing : Moderate assistance;Sitting   Lower Body Dressing: Maximal assistance;Sit to/from stand   Toilet Transfer: Moderate assistance;Stand-pivot;BSC   Toileting- Clothing Manipulation and Hygiene: Maximal assistance;Sit to/from stand       Functional mobility during ADLs: Moderate assistance General ADL Comments:  Pt's cognitive deficits limiting ability to complete ADLs and functional mobility, requiring  increased assist      Vision Baseline Vision/History: Wears glasses Wears Glasses: At all times                  Pertinent Vitals/Pain Pain Assessment: No/denies pain          Extremity/Trunk Assessment Upper Extremity Assessment Upper Extremity Assessment: Generalized weakness   Lower Extremity Assessment Lower Extremity Assessment: Defer to PT evaluation   Cervical / Trunk Assessment Cervical / Trunk Assessment: Kyphotic   Communication Communication Communication: HOH   Cognition Arousal/Alertness: Awake/alert Behavior During Therapy: Restless;Flat affect Overall Cognitive Status: Impaired/Different from baseline Area of Impairment: Orientation;Attention;Memory;Following commands;Safety/judgement;Awareness;Problem solving                 Orientation Level: Situation;Time Current Attention Level: Selective Memory: Decreased short-term memory Following Commands: Follows one step commands inconsistently Safety/Judgement: Decreased awareness of safety;Decreased awareness of deficits Awareness: Intellectual Problem Solving: Requires verbal cues;Requires tactile cues;Difficulty sequencing;Slow processing;Decreased initiation General Comments: Pt unable to complete familiar task of brushing teeth; attempting to place toothpaste bottle in mouth when handed to him, when attempting [facility] with toothbrush Pt unable to complete task; Pt requires Max multimodal cues during transfers to sequence and process directions   General Comments  VSS throughout session               [place] expects to be discharged to:: Skilled nursing facility                                        Prior Functioning/Environment Level of Independence: Independent        Comments: independent prior to 9/25 admission        OT Problem List: Decreased strength;Decreased cognition;Decreased activity tolerance;Decreased safety awareness;Impaired balance  (sitting and/or standing)      OT Treatment/Interventions: Self-care/ADL training;DME and/or AE instruction;Therapeutic activities;Balance training;Therapeutic exercise;Energy conservation;Patient/family education    OT Goals(Current goals can be found in the care plan section) Acute Rehab OT Goals Patient Stated Goal: none stated OT Goal Formulation: Patient unable to participate in goal setting Time For Goal Achievement: 08/18/17 Potential to Achieve Goals: Fair  OT Frequency: Min 2X/week                             AM-PAC PT "6 Clicks" Daily Activity     Outcome Measure Help from another person eating meals?: A Little Help from another person taking care of personal grooming?: A Lot Help from another person toileting, which includes using toliet, bedpan, or urinal?: Total Help from another person bathing (including washing, rinsing, drying)?: A Lot Help from another person to put on and taking off regular upper body clothing?: A Lot Help from another person to put on and taking off regular lower body clothing?: Total 6 Click Score: 11   End of Session Equipment Utilized During Treatment: Gait belt Nurse Communication: Mobility status  Activity Tolerance: Patient tolerated treatment well Patient left: in bed;with bed alarm set  OT Visit Diagnosis: Unsteadiness on feet (R26.81);Other symptoms and signs involving cognitive function;Muscle weakness (generalized) (M62.81)                Time: 1610-9604 OT Time Calculation (min): 28 min Charges:  OT General Charges $OT Visit: 1 Visit OT Evaluation $OT Eval Moderate  Complexity: 1 Mod OT Treatments $Self Care/Home Management : 8-22 mins G-Codes:     Lou Cal, OT Pager [phone] 08/04/2017  Raymondo Band 08/04/2017, 3:52 PM

## 2017-08-04 NOTE — Consult Note (Signed)
   [facility] Inpatient Consult   08/04/2017  Joshua [name] 28-Aug-1946 [id number]   Patient is currently active with [facility] for chronic disease management services in the Medicare ACO.  Patient has been engaged by a Community education officer from a community referral. Patient has admitted with Hepatic Encephalopathy.   Our community based plan of care has focused on disease management and community resource support with the patient's sister, Joshua [name].  Patient will receive a post discharge transition of care call and will be evaluated for monthly home visits for assessments and disease process education. Spoke with  Inpatient Case Manager aware that [facility] was following.  Current recommendation is for skilled facility verse Assisted Living as the patient lives alone and is currently unable to participate in conversations.  Of note, [facility] Care Management services does not replace or interfere with any services that are needed or arranged by inpatient case management or social work.  For additional questions or referrals please contact:   Joshua Brood, RN BSN [facility] Liaison  [phone] business mobile phone Toll free office [phone]

## 2017-08-04 NOTE — Progress Notes (Addendum)
PROGRESS NOTE    Joshua [name]  SEG:[id number] DOB: November 11, 1945 DOA: 08/03/2017 PCP: [name], MD     Brief Narrative:  Joshua [name] is a 71 y.o. male with medical history significant of cirrhosis, anemia, Hepatitis C, HTN. He was recently discharged from hospital on 07/21/2017 after a 3 day admission for acute hepatic encephalopathy. History is gathered by patient's sister at bedside. She states that she saw him at home on the following day after discharge, patient was not back to his baseline. She is unsure if he has been having more than 3 bowel movements daily as she has not been keeping track. She has been administering lactulose as directed. Patient was brought back to the hospital; ammonia found to be elevated.  Assessment & Plan:   Active Problems:   Cirrhosis ([place])   Hepatitis C   HTN (hypertension)   Hyperammonemia ([facility])   Acute hepatic encephalopathy   Hyponatremia   Nonhealing nonsurgical wound   AKI (acute kidney injury) ([place])   GERD (gastroesophageal reflux disease)   Hepatic encephalopathy -Continue lactulose QID, add Xifaxan -May need lactulose enema if < 3 bowel movements today -Monitor   Cirrhosis with thrombocytopenia, hx Hep C  -Treated with Harvoni in 2015, followed with [facility] and liver transplant but does not appear to have seen anyone since 2015  -MELD 22  -Hold spironolactone, Lasix due to AKI and hyponatremia   Acute kidney injury -? Hepatorenal -Baseline Cr 1 -IV fluid for now, improving, trend BMP  Asymptomatic pyuria -Unable to ask patient if he has been having dysuria symptoms, no complaints prior to admission -Patient without fevers, elevated leukocytosis  Hyponatremia -Improving with IV fluid -Trend  Chronic bilateral lower extremity wounds -Without evidence of acute infection, recently finished Bactrim in the outpatient setting -Wound RN consult  Goals of care -Palliative care consulted due to patient's poor overall  prognosis, as well as recurrent admissions    DVT prophylaxis: Unable to give anticoag due to thrombocytopenia, no SCD due to bilateral LE wounds  Code Status: DNR Family Communication: Sister at bedside, extended conversation this morning Disposition Plan: Pending improvement, possibly SNF    Consultants:   None  Procedures:   None  Antimicrobials:  Anti-infectives    Start     Dose/Rate Route Frequency Ordered Stop   08/03/17 1400  rifaximin (XIFAXAN) tablet 400 mg     400 mg Oral 3 times daily 08/03/17 1206         Subjective: Patient with altered mental status, unable to participate in exam  Objective: Vitals:   08/03/17 1600 08/03/17 1636 08/03/17 2125 08/04/17 0444  BP: 137/71 (!) 139/59 124/62 124/61  Pulse: 78 76 81 82  Resp: 15 16 18 18   Temp:   98.3 F (36.8 C) (!) 97.5 F (36.4 C)  TempSrc:   Oral Oral  SpO2: 100% 100% 100% 97%    Intake/Output Summary (Last 24 hours) at 08/04/17 1347 Last data filed at 08/04/17 1309  Gross per 24 hour  Intake           1172.5 ml  Output             2500 ml  Net          -1327.5 ml   There were no vitals filed for this visit.  Examination:  General exam: Appears calm and comfortable, somnolent  Respiratory system: Clear to auscultation. Respiratory effort normal. Cardiovascular system: S1 & S2 heard, RRR. +systolic murmur, rubs, gallops  or clicks.+pedal edema. Gastrointestinal system: Abdomen is nondistended, soft and nontender. No organomegaly or masses felt. Normal bowel sounds heard. Central nervous system: Somnolent  Extremities: Symmetric  Skin: +right shin with superficial wound, +left shin with larger necrotic base wound   Data Reviewed: I have personally reviewed following labs and imaging studies  CBC:  Recent Labs Lab 08/03/17 0928 08/04/17 0617  WBC 5.1 4.6  HGB 12.8* 11.3*  HCT 36.6* 33.1*  MCV 90.1 89.9  PLT 69* 73*   Basic Metabolic Panel:  Recent Labs Lab 08/03/17 0928  08/03/17 1851 08/04/17 0617  NA 124* 127* 128*  K 5.3* 4.7 4.6  CL 96* 99* 101  CO2 18* 17* 18*  GLUCOSE 85 99 80  BUN 16 17 15   CREATININE 1.47* 1.44* 1.30*  CALCIUM 8.6* 8.7* 8.2*   GFR: CrCl cannot be calculated (Unknown ideal weight.). Liver Function Tests:  Recent Labs Lab 08/03/17 0928 08/03/17 1851 08/04/17 0617  AST 42* 39 37  ALT 35 33 29  ALKPHOS 73 73 65  BILITOT 1.9* 1.9* 1.9*  PROT 5.5* 5.5* 4.7*  ALBUMIN 3.0* 3.0* 2.6*   No results for input(s): LIPASE, AMYLASE in the last 168 hours.  Recent Labs Lab 08/03/17 0917 08/03/17 1851 08/04/17 0617  AMMONIA 138* 88* 139*   Coagulation Profile: No results for input(s): INR, PROTIME in the last 168 hours. Cardiac Enzymes: No results for input(s): CKTOTAL, CKMB, CKMBINDEX, TROPONINI in the last 168 hours. BNP (last 3 results) No results for input(s): PROBNP in the last 8760 hours. HbA1C: No results for input(s): HGBA1C in the last 72 hours. CBG: No results for input(s): GLUCAP in the last 168 hours. Lipid Profile: No results for input(s): CHOL, HDL, LDLCALC, TRIG, CHOLHDL, LDLDIRECT in the last 72 hours. Thyroid Function Tests: No results for input(s): TSH, T4TOTAL, FREET4, T3FREE, THYROIDAB in the last 72 hours. Anemia Panel: No results for input(s): VITAMINB12, FOLATE, FERRITIN, TIBC, IRON, RETICCTPCT in the last 72 hours. Sepsis Labs: No results for input(s): PROCALCITON, LATICACIDVEN in the last 168 hours.  No results found for this or any previous visit (from the past 240 hour(s)).     Radiology Studies: No results found.    Scheduled Meds: . lactulose  30 g Oral QID  . pantoprazole  40 mg Oral Daily  . polyvinyl alcohol  1 drop Both Eyes QHS  . rifaximin  400 mg Oral TID   Continuous Infusions: . sodium chloride Stopped (08/04/17 0746)     LOS: 1 day    Time spent: 50 minutes   Dessa Phi, DO [facility] [email or web address] Password [email or web address] 08/04/2017, 1:47 PM

## 2017-08-04 NOTE — NC FL2 (Signed)
[place] LEVEL OF CARE SCREENING TOOL     IDENTIFICATION  Patient Name: Joshua [name] Birthdate: 1945-10-31 Sex: male Admission Date (Current Location): 08/03/2017  [facility] and [place] Number:  Herbalist and Address:  [facility] [place], [place] [address] [place], [place] [address]      Provider Number: [id number]  Attending Physician Name and Address:  Dessa Phi Chahn-Yan*  Relative Name and Phone Number:  [name], sister, [phone]    Current Level of Care: Hospital Recommended Level of Care: [place] Prior Approval Number:    Date Approved/Denied:   PASRR Number: [id number]  Discharge Plan: SNF    Current Diagnoses: Patient Active Problem List   Diagnosis Date Noted  . Altered mental status 08/03/2017  . Nonhealing nonsurgical wound 08/03/2017  . AKI (acute kidney injury) ([place]) 08/03/2017  . GERD (gastroesophageal reflux disease) 08/03/2017  . Acute hepatic encephalopathy 07/18/2017  . Hyponatremia 07/18/2017  . Pulmonary nodules 07/18/2017  . Hepatic encephalopathy ([place]) 07/18/2017  . Hyperammonemia ([place]) 05/22/2014  . Acute encephalopathy 05/22/2014  . Renal failure (ARF), acute on chronic ([place]) 05/22/2014  . Cellulitis 02/17/2014  . Cirrhosis ([place]) 02/17/2014  . Hepatitis C 02/17/2014  . HTN (hypertension) 02/17/2014    Orientation RESPIRATION BLADDER Height & Weight     Self  Normal Incontinent, External catheter Weight:   Height:     BEHAVIORAL SYMPTOMS/MOOD NEUROLOGICAL BOWEL NUTRITION STATUS      Continent Diet (Please see DC Summary)  AMBULATORY STATUS COMMUNICATION OF NEEDS Skin   Limited Assist Verbally Normal                       Personal Care Assistance Level of Assistance  Bathing, Feeding, Dressing Bathing Assistance: Maximum assistance Feeding assistance: Independent Dressing Assistance: Limited assistance     Functional Limitations Info              SPECIAL CARE FACTORS FREQUENCY  PT (By licensed PT)     PT Frequency: not yet assesed              Contractures      Additional Factors Info  Code Status, Allergies Code Status Info: DNR Allergies Info: Codeine           Current Medications (08/04/2017):  This is the current hospital active medication list Current Facility-Administered Medications  Medication Dose Route Frequency Provider Last Rate Last Dose  . 0.9 %  sodium chloride infusion   Intravenous Continuous Dessa Phi Chahn-Yang, DO   Stopped at 08/04/17 0746  . acetaminophen (TYLENOL) tablet 650 mg  650 mg Oral Q6H PRN Waldemar Dickens, MD       Or  . acetaminophen (TYLENOL) suppository 650 mg  650 mg Rectal Q6H PRN Waldemar Dickens, MD      . lactulose (CHRONULAC) 10 GM/15ML solution 30 g  30 g Oral QID Waldemar Dickens, MD   30 g at 08/04/17 1007  . ondansetron (ZOFRAN) tablet 4 mg  4 mg Oral Q6H PRN Waldemar Dickens, MD       Or  . ondansetron [facility]) injection 4 mg  4 mg Intravenous Q6H PRN Waldemar Dickens, MD      . pantoprazole (PROTONIX) EC tablet 40 mg  40 mg Oral Daily Waldemar Dickens, MD   40 mg at 08/04/17 1007  . polyvinyl alcohol (LIQUIFILM TEARS) 1.4 % ophthalmic solution 1 drop  1 drop Both Eyes  QHS Skeet Simmer, RPH   1 drop at 08/03/17 2247  . rifaximin (XIFAXAN) tablet 400 mg  400 mg Oral TID Waldemar Dickens, MD   400 mg at 08/04/17 1007     Discharge Medications: Please see discharge summary for a list of discharge medications.  Relevant Imaging Results:  Relevant Lab Results:   Additional Information SSN: [place], [place]

## 2017-08-04 NOTE — Progress Notes (Signed)
CSW spoke with patient's sister. She would like to meet with Palliative and CSW tomorrow when brother arrives at [facility] [place] [phone]

## 2017-08-04 NOTE — Consult Note (Signed)
Consultation Note Date: 08/04/2017   Patient Name: Joshua [name]  DOB: 08-29-1946  MRN: [id number]  Age / Sex: 71 y.o., male  PCP: Joshua Seashore, MD Referring Physician: Baird [name]*  Reason for Consultation: Establishing goals of care  HPI/Patient Profile: 71 y.o. male  with past medical history of Hepatitis C (not responsive to interferon and susequently treated with Harvoni), Cirrhosis, HTN, grade 2 esophageal varices, and anemia. He was recently here 9/24-9/27 due to acute hepatic encephalopathy. Since discharge his mental status has never returned to his baseline. His sister is his primary caregiver and noted progressive confusion over the 24 hours prior to admission. She has been giving him lactulose three times daily, however no BM in 24 hours. He now presents to the ED with recurrent acute hepatic encephalopathy with Ammonia 138. He was admitted on 08/03/2017. Palliative consulted to assist in clarifying goals of care given his recurrent issues secondary to cirrhosis.   Clinical Assessment and Goals of Care: Joshua [name] is unable to participate in a goals of care conversation secondary to encephalopathy. I met with his sister, Joshua [name], who is his HCPOA. Joshua [name] relayed a slow yet progressive deterioration in her brother status over the past year. She has noted changes in his personality with increasing agitation/irritation and erratic behavior. Despite recently good exercise tolerance, he has also been experiencing increased falls with injury.   Joshua [name] and I talked through the underlying etiology of his issues--namely cirrhosis. Joshua [name] had an excellent understanding of the disease, as well as the associated symptoms, and her brother's prognosis. Her main concern is ensuring he has good quality of life and is in a living situation that can adequately meet his needs. Importantly, he cannot be home along anymore and family was already  in the process of setting up assisted living (though may need SNF at this point pending improvement). We discussed the process for determining his care needs, with the understanding that we will need to improve his ammonia levels prior to PT assessment.   Finally, we did discuss code status. She explained that they had not had an in-depth conversation about code status, however she understood his wishes to be the allowance of a natural death. I clarified that would mean, in the event of cardiac or respiratory failure, no CPR, defibrillation, or intubation. She agreed with this and understood that would be a DNR (DO NOT RESUSCITATE) status. Importantly, she would like the opportunity to fully discuss it with him if he is able to achieve mental clarity again. She wants DNR placed for now, however. She did have a copy of HCPOA paperwork; I placed a copy of it in the paper chart.  Primary Decision [employer], pt's sister: Joshua [name]. HCPOA paperwork reviewed and copy made and placed in paper chart.    SUMMARY OF RECOMMENDATIONS    DNR  Plan to continue supportive care in hopes of improvement; repeat family meeting planned for 10/12 at noon. Pt's brother and sister-in-law will be present then as well  Code Status/Advance Care Planning:  DNR  Symptom Management:   Pt encephalopathic however appears quite comfortable in bed and repeatedly denies complaints/concerns.  Palliative Prophylaxis:   Bowel Regimen, Palliative Wound Care and Turn Reposition  Additional Recommendations (Limitations, Scope, Preferences):  Full Scope Treatment  Psycho-social/Spiritual:   Desire for further Chaplaincy support:no  Prognosis:   Unable to determine. Pt with cirrhosis with recurrent hepatic encephalopathy from elevated ammonia levels.   Discharge Planning: To Be Determined.  Came from home,  will likely require assisted living vs SNF based on response to treatment and PT evaluation.      Primary  Diagnoses: Present on Admission: . Acute hepatic encephalopathy . Cirrhosis ([place]) . Hepatitis C . HTN (hypertension) . Hyponatremia . Hyperammonemia ([place])   I have reviewed the medical record, interviewed the patient and family, and examined the patient. The following aspects are pertinent.  Past Medical History:  Diagnosis Date  . Anemia    low platelets/liver disease  . Arthritis   . Cirrhosis ([place])   . Hepatitis C   . Hypertension   . Wears glasses   . Wears partial dentures    top partial   Social History   Social History  . Marital status: Single    Spouse name: N/A  . Number of children: N/A  . Years of education: N/A   Social History Main Topics  . Smoking status: Light Tobacco Smoker  . Smokeless tobacco: Current User  . Alcohol use Yes     Comment: "one beer a week"  . Drug use: Yes    Types: Marijuana  . Sexual activity: Yes     Comment: smoking every few days-to weeks   Other Topics Concern  . None   Social History Narrative  . None   Family History  Problem Relation Age of Onset  . Cirrhosis Mother   . Cirrhosis Father   . CAD Neg Hx    Scheduled Meds: . lactulose  30 g Oral QID  . pantoprazole  40 mg Oral Daily  . polyvinyl alcohol  1 drop Both Eyes QHS  . rifaximin  400 mg Oral TID   Continuous Infusions: . sodium chloride Stopped (08/04/17 0746)   PRN Meds:.acetaminophen **OR** acetaminophen, ondansetron **OR** ondansetron (ZOFRAN) IV Allergies  Allergen Reactions  . Codeine Itching   Review of Systems  Unable to perform ROS --Remains significantly encephalopathic.  Physical Exam  Constitutional: He appears well-developed and well-nourished. No distress.  HENT:  Head: Normocephalic and atraumatic.  Mouth/Throat: Oropharynx is clear and moist. No oropharyngeal exudate.  Partial dentures  Eyes: EOM are normal. Scleral icterus (mild) is present.  Neck: Normal range of motion. Neck supple.  Cardiovascular: Normal rate and  regular rhythm.   Pulmonary/Chest: Effort normal and breath sounds normal. No respiratory distress.  Abdominal: He exhibits distension. Bowel sounds are decreased. There is no tenderness.  Musculoskeletal: He exhibits edema (3+ pitting edema in BLE).  Able to move all extremities, weak, mild jerking of BUE noted.   Neurological:  Encephalopathic. Sleepy but socially responsive. Oriented to self only. Can follow simple commands with demonstration.   Skin: There is pallor.  LLE with skin tear with blackened area (?eschar). RLE with ulceration.  Psychiatric:  Calm in bed. Encephalopathic and slow to respond.     Vital Signs: BP 124/61 (BP Location: Right Arm)   Pulse 82   Temp (!) 97.5 F (36.4 C) (Oral)   Resp 18   SpO2 97%  Pain Assessment: No/denies pain   Pain Score: 0-No pain   SpO2: SpO2: 97 % O2 Device:SpO2: 97 % O2 Flow Rate: .   IO: Intake/output summary:  Intake/Output Summary (Last 24 hours) at 08/04/17 0914 Last data filed at 08/04/17 0710  Gross per 24 hour  Intake            992.5 ml  Output             2500 ml  Net          -  1507.5 ml    LBM: Last BM Date: 08/04/17 Baseline Weight:   Most recent weight:       Palliative Assessment/Data: Encephalopathic and requiring total care.      Time In: 1020 Time Out: 1100 Time Total: 75 minutes Greater than 50%  of this time was spent counseling and coordinating care related to the above assessment and plan.  Signed by: Charlynn Court, NP Palliative Medicine Team Pager # [phone] (M-F 7a-5p) Team Phone # [phone] (Nights/Weekends)

## 2017-08-04 NOTE — Consult Note (Addendum)
[facility] Nurse wound consult note Reason for Consult: Consult requested for BLE.  Pt is familiar to [facility] team from a recent admission , refer to progress notes on 9/26. Pt previously had a fall and has full thickness wounds. Wound type: Left anterior calf 5X4X.3cm "L shaped " wound, 50% hard eschar from a previous hematoma which has evolved, and 50% red and dry wound bed.  No odor or drainage.   Right anterior calf with full thickness wound; 2X1X.2cm and .3X.3X.2cm, 90% red and dry, 10% yellow. Small amt blood-tinged drainage, no odor, surrounded by small scattered areas of dry dark brown scabs.   Dressing procedure/placement/frequency: Santyl ointment to soften and provide enzymatic debridement of nonviable tissue to left leg.  Xeroform gauze to promote moist healing to right leg wounds.  No family present to discuss plan of care and patient does not appear to understand. Please re-consult if further assistance is needed.  Thank-you,  Julien Girt MSN, [place], [place], [place], [place]

## 2017-08-04 NOTE — Evaluation (Signed)
Physical Therapy Evaluation Patient Details Name: Joshua [name] MRN: [id number] DOB: 25-Mar-1946 Today's Date: 08/04/2017   History of Present Illness  Joshua [name] is a 71 y.o. male admitted with increasing confusion secondary to Acute on Chronic hepatic encephalopathy. Pt was discharged from his current hospital 07/21/2017 after a 3 day admission for acute hepatic encephalopathy secondary to elevated ammonia levels. Since time of discharge patient's mental status has never been completely back to his baseline. Level V caveat applies this patient has acutely altered. History provided per Joshua [name] by patient's sister who is his primary caregiver. Patient has had waxing and waning periods of mentation since discharge. Prior medical history significant of CIrrhosis, anemia, Hepatitis C, HTN  Clinical Impression  Pt admitted with above diagnosis. Pt currently with functional limitations due to the deficits listed below (see PT Problem List). Pt safe mobility is limited by his altered mental status and his inability to sequence tasks like sitting on EoB or transferring to the recliner despite appropriate verbal and tactile cuing. Pt is currently minA for bed mobility and modA for stand pivot transfer to recliner. Pt will benefit from skilled PT to increase their independence and safety with mobility to allow discharge to the venue listed below.       Follow Up Recommendations SNF    Equipment Recommendations  None recommended by PT    Recommendations for Other Services OT consult     Precautions / Restrictions Precautions Precautions: Fall Restrictions Weight Bearing Restrictions: No      Mobility  Bed Mobility Overal bed mobility: Needs Assistance       Supine to sit: Min assist     General bed mobility comments: pt requires maximal verbal and tactile cuing. pt initiated task of bringing LE to edge of the bed and then would put them back in the bed and would need to be told  again that he was getting his legs to the edge of the bed, requires minA to bring trunk to upright and to scoot hips to EoB   Transfers Overall transfer level: Needs assistance Equipment used: Rolling walker (2 wheeled);1 person hand held assist Transfers: Stand Pivot Transfers;Sit to/from Stand Sit to Stand: Mod assist Stand pivot transfers: Mod assist       General transfer comment: attempted to use RW for transfers however pt was unfamiliar with its use and could not keep his attention on UE placement to use RW to steady himself, did better with HHA but required constant single step verbal and tactile cues to pivot to sit in recliner     Balance Overall balance assessment: Needs assistance Sitting-balance support: Feet supported Sitting balance-Leahy Scale: Fair     Standing balance support: Bilateral upper extremity supported Standing balance-Leahy Scale: Poor Standing balance comment: requires minA to maintain standing balance                             Pertinent Vitals/Pain Pain Assessment: No/denies pain    Home Living Family/patient expects to be discharged to:: Skilled nursing facility                      Prior Function Level of Independence: Independent         Comments: independent prior to 9/25 admission     Hand Dominance        Extremity/Trunk Assessment   Upper Extremity Assessment Upper Extremity Assessment: Generalized weakness    Lower  Extremity Assessment Lower Extremity Assessment: Generalized weakness    Cervical / Trunk Assessment Cervical / Trunk Assessment: Kyphotic  Communication   Communication: HOH  Cognition Arousal/Alertness: Awake/alert Behavior During Therapy: Restless;Flat affect Overall Cognitive Status: Impaired/Different from baseline Area of Impairment: Orientation;Attention;Memory;Following commands;Safety/judgement;Awareness;Problem solving                 Orientation Level:  Situation;Time Current Attention Level: Selective Memory: Decreased short-term memory Following Commands: Follows one step commands inconsistently Safety/Judgement: Decreased awareness of safety;Decreased awareness of deficits Awareness: Intellectual Problem Solving: Requires verbal cues;Requires tactile cues;Difficulty sequencing;Slow processing General Comments: pt lifelong friend in room but forgot he was there when he was out of sight, each time he saw him he acted as though he had just arrived, requires constant cuing to stay on task, and unable to use RW as he is unfamiliar with its use       General Comments General comments (skin integrity, edema, etc.): VSS throughout session        Assessment/Plan    PT Assessment Patient needs continued PT services  PT Problem List Decreased knowledge of use of DME;Decreased balance;Decreased mobility;Decreased safety awareness;Decreased coordination       PT Treatment Interventions Gait training;Functional mobility training;Therapeutic activities;Therapeutic exercise;Balance training;Cognitive remediation;Patient/family education    PT Goals (Current goals can be found in the Care Plan section)  Acute Rehab PT Goals Patient Stated Goal: none stated PT Goal Formulation: With patient/family Time For Goal Achievement: 08/18/17 Potential to Achieve Goals: Fair    Frequency Min 3X/week   Barriers to discharge Decreased caregiver support      Co-evaluation               AM-PAC PT "6 Clicks" Daily Activity  Outcome Measure Difficulty turning over in bed (including adjusting bedclothes, sheets and blankets)?: Unable Difficulty moving from lying on back to sitting on the side of the bed? : Unable Difficulty sitting down on and standing up from a chair with arms (e.g., wheelchair, bedside commode, etc,.)?: Unable Help needed moving to and from a bed to chair (including a wheelchair)?: Total Help needed walking in hospital room?:  Total Help needed climbing 3-5 steps with a railing? : Total 6 Click Score: 6    End of Session Equipment Utilized During Treatment: Gait belt Activity Tolerance: Patient tolerated treatment well Patient left: in chair;with call bell/phone within reach;with chair alarm set;with family/visitor present Nurse Communication: Mobility status PT Visit Diagnosis: Other symptoms and signs involving the nervous system (R29.898);Other abnormalities of gait and mobility (R26.89);History of falling (Z91.81);Difficulty in walking, not elsewhere classified (R26.2);Muscle weakness (generalized) (M62.81)    Time: 6387-5643 PT Time Calculation (min) (ACUTE ONLY): 30 min   Charges:   PT Evaluation $PT Eval Moderate Complexity: 1 Mod PT Treatments $Therapeutic Activity: 8-22 mins   PT G Codes:        Nolin Grell B. Migdalia Dk PT, DPT Acute Rehabilitation  [phone] Pager [phone]    Etna Green 08/04/2017, 2:11 PM

## 2017-08-04 NOTE — Care Management Note (Signed)
Case Management Note  Patient Details  Name: MACIAH SCHWEIGERT MRN: [id number] Date of Birth: 11-27-1945  Subjective/Objective:      Admitted with acute encephalopathy,  hx of CIrrhosis, anemia, Hepatitis C, HTN.           Tavious Griesinger (Sister)     [phone]       PCP: [name]  Action/Plan:  PT/OT evals pending...Marland KitchenMarland KitchenCM to f/u with disposition needs.  Expected Discharge Date:                  Expected Discharge Plan:  Skilled Nursing Facility  In-House Referral:  Clinical Social Work  Discharge planning Services  CM Consult  Post Acute Care Choice:    Choice offered to:     DME Arranged:    DME Agency:     HH Arranged:    [place] Agency:     Status of Service:  In process, will continue to follow  If discussed at Long Length of Stay Meetings, dates discussed:    Additional Comments:  Sharin Mons, RN 08/04/2017, 11:42 AM

## 2017-08-05 LAB — BASIC METABOLIC PANEL
Anion gap: 10 (ref 5–15)
BUN: 12 mg/dL (ref 6–20)
CALCIUM: 8.2 mg/dL — AB (ref 8.9–10.3)
CO2: 17 mmol/L — ABNORMAL LOW (ref 22–32)
CREATININE: 1.15 mg/dL (ref 0.61–1.24)
Chloride: 105 mmol/L (ref 101–111)
GFR calc non Af Amer: >60 mL/min (ref >60)
Glucose, Bld: 79 mg/dL (ref 65–99)
Potassium: 4 mmol/L (ref 3.5–5.1)
SODIUM: 132 mmol/L — AB (ref 135–145)

## 2017-08-05 LAB — CBC WITH DIFFERENTIAL/PLATELET
BASOS PCT: 1 %
Basophils Absolute: 0 10*3/uL (ref 0.0–0.1)
EOS ABS: 0.1 10*3/uL (ref 0.0–0.7)
EOS PCT: 2 %
HCT: 37.3 % — ABNORMAL LOW (ref 39.0–52.0)
Hemoglobin: 13.4 g/dL (ref 13.0–17.0)
LYMPHS ABS: 1 10*3/uL (ref 0.7–4.0)
Lymphocytes Relative: 21 %
MCH: 32.6 pg (ref 26.0–34.0)
MCHC: 35.9 g/dL (ref 30.0–36.0)
MCV: 90.8 fL (ref 78.0–100.0)
MONO ABS: 0.5 10*3/uL (ref 0.1–1.0)
MONOS PCT: 10 %
NEUTROS PCT: 67 %
Neutro Abs: 3.2 10*3/uL (ref 1.7–7.7)
PLATELETS: 69 10*3/uL — AB (ref 150–400)
RBC: 4.11 MIL/uL — ABNORMAL LOW (ref 4.22–5.81)
RDW: 15.2 % (ref 11.5–15.5)
WBC: 4.8 10*3/uL (ref 4.0–10.5)

## 2017-08-05 LAB — HEPATIC FUNCTION PANEL
ALBUMIN: 2.8 g/dL — AB (ref 3.5–5.0)
ALK PHOS: 70 U/L (ref 38–126)
ALT: 32 U/L (ref 17–63)
AST: 45 U/L — ABNORMAL HIGH (ref 15–41)
BILIRUBIN INDIRECT: 1.5 mg/dL — AB (ref 0.3–0.9)
Bilirubin, Direct: 0.5 mg/dL (ref 0.1–0.5)
TOTAL PROTEIN: 5.2 g/dL — AB (ref 6.5–8.1)
Total Bilirubin: 2 mg/dL — ABNORMAL HIGH (ref 0.3–1.2)

## 2017-08-05 LAB — MAGNESIUM: MAGNESIUM: 2 mg/dL (ref 1.7–2.4)

## 2017-08-05 NOTE — Progress Notes (Signed)
Daily Progress Note   Patient Name: Joshua [name]       Date: 08/05/2017 DOB: [birth year]  Age: 71 y.o. MRN#: [id number] Attending Physician: Baird Lyons* Primary Care Physician: [name], MD Admit Date: 08/03/2017  Reason for Consultation/Follow-up: Disposition and Psychosocial/spiritual support  Subjective: Mr. Lashley is has cognitively improved since yesterday. He had four bowel movements since yesterday, and is now oriented to person, place, and time. His main concern at this point is improving his strength. He has no complaints. His sister and brother met with me first at the bedside and then in the conference room on the floor. They had extensive concerns about discharge plan as well as the long term care plan.   Length of Stay: 2  Current Medications: Scheduled Meds:  . collagenase   Topical Daily  . lactulose  30 g Oral QID  . pantoprazole  40 mg Oral Daily  . polyvinyl alcohol  1 drop Both Eyes QHS  . rifaximin  400 mg Oral TID    Continuous Infusions: . sodium chloride 100 mL/hr at 08/05/17 0253    PRN Meds: acetaminophen **OR** acetaminophen, ondansetron **OR** ondansetron (ZOFRAN) IV  Physical Exam       Constitutional: He appears well-developed and well-nourished. No distress.  HENT:  Head: Normocephalic and atraumatic.  Mouth/Throat: Oropharynx is clear and moist. No oropharyngeal exudate.  Partial dentures  Eyes: EOM are normal. Scleral icterus (mild) is present.  Neck: Normal range of motion. Neck supple.  Cardiovascular: Normal rate and regular rhythm.   Pulmonary/Chest: Effort normal and breath sounds normal. No respiratory distress.  Abdominal: He exhibits soft abdomen.There is no tenderness. Active bowel sounds.  Musculoskeletal: He exhibits edema (3+ pitting edema in BLE).  Able  to move all extremities however weakness noted. Neurological:  Now alert and oriented to person, place, and time. Poor insight into situation. Trouble processing more complex conversation.  Skin: There is pallor.  LLE with skin tear with blackened area (?eschar). RLE with ulceration.  Psychiatric:  Calm in bed with normal mood and affect. Speech slightly slurred. Poor insight and judgement.   Vital Signs: BP (!) 120/54 (BP Location: Right Arm)   Pulse 75   Temp 98.1 F (36.7 C) (Oral)   Resp 18   SpO2 100%  SpO2: SpO2: 100 % O2 Device: O2 Device: Not Delivered O2 Flow Rate:    Intake/output summary:  Intake/Output Summary (Last 24 hours) at 08/05/17 1303 Last data filed at 08/05/17 1042  Gross per 24 hour  Intake          2816.66 ml  Output             1750 ml  Net          1066.66 ml   LBM: Last BM Date: 08/04/17 Baseline Weight:   Most recent weight:    Palliative Assessment/Data: PPS 40-50%    Patient Active Problem List   Diagnosis Date Noted  . Goals of care, counseling/discussion   . Palliative care by specialist   . Altered mental status 08/03/2017  . Nonhealing nonsurgical wound 08/03/2017  . AKI (acute kidney injury) ([place]) 08/03/2017  . GERD (gastroesophageal reflux disease) 08/03/2017  .  Acute hepatic encephalopathy 07/18/2017  . Hyponatremia 07/18/2017  . Pulmonary nodules 07/18/2017  . Hepatic encephalopathy ([place]) 07/18/2017  . Hyperammonemia ([place]) 05/22/2014  . Acute encephalopathy 05/22/2014  . Renal failure (ARF), acute on chronic ([place]) 05/22/2014  . Cellulitis 02/17/2014  . Cirrhosis ([place]) 02/17/2014  . Hepatitis C 02/17/2014  . HTN (hypertension) 02/17/2014    Palliative Care Assessment & Plan   HPI: 71 y.o. male  with past medical history of Hepatitis C (not responsive to interferon and susequently treated with Harvoni), Cirrhosis, HTN, grade 2 esophageal varices, and anemia. He was recently here 9/24-9/27 due to acute hepatic  encephalopathy. Since discharge his mental status has never returned to his baseline. His sister is his primary caregiver and noted progressive confusion over the 24 hours prior to admission. She has been giving him lactulose three times daily, however no BM in 24 hours. He now presents to the ED with recurrent acute hepatic encephalopathy with Ammonia 138. He was admitted on 08/03/2017. Palliative consulted to assist in clarifying goals of care given his recurrent issues secondary to cirrhosis.   Assessment: Mr. Gladu had a marked cognitive improvement since I last saw him yesterday. We had a good conversation exploring his understanding of his health issues. He was able to tell me that he had Hepatitis C, and it was subsequently treated and is no longer present. He also understood the virus had caused damage to his liver, which is irreversible and causing some of the symptoms he has been struggling with (he named confusion and weakness). I mentioned the word cirrhosis, which he did recall being told he had. Furthermore, he understood his life would likely be limited by his liver issues and he is now at the point that he cannot be independent at home and needs more consistent support.  Beyond the above mentioned understandings, Mr. Gift had poor insight into disease management. He could not tell me what medications he was on, why taking medication was important in relation to his cirrhosis, or any signs or symptoms that would suggest he needs to seek out medical help. When given scenarios, such as if you find large amounts of blood in your stool, he was not able to verbalize how he would manage that (for the bleeding scenario he said he would try to eat less so he didn't poop as much).   Finally, I did ask him about his desires surrounding end of life. He shared that he would not want to be "kept like a vegetable" and did not want things that would cause pain or suffering. I specifically asked about his  desire for aggressive interventions if his heart or lungs stopped, and he restated he did not want to suffer or be on machines. He could not definitely say yes/no to CPR, defibrillation, or intubation, however his expressed wishes did align with the DNR decision made by his sister and reflected in the chart.   After meeting with Mr. Freeland I then met with his sister [facility]) and brother (durable POA) outside of his room. Cedric Fishman with SW participated in this meeting. We discussed his health issues, acute treatment, as well as the likely long term prognosis and changing care needs.   Recommendations/Plan:  DNR  SNF with Palliative on discharge. SW providing Medicaid application and sister/brother encouraged to meet with Medicaid case manager to discuss application and eligibility  Goals of Care and Additional Recommendations:  Limitations on Scope of Treatment: Full Scope Treatment  Code Status:  DNR  Prognosis:   Unable to determine  Discharge Planning:  [place] for rehab with Palliative care service follow-up  Care plan was discussed with pt, sister, brother, and SW.  Thank you for allowing the Palliative Medicine Team to assist in the care of this patient.  Time in: 1200 Time out: 1310 Total time: 105 minutes    Greater than 50%  of this time was spent counseling and coordinating care related to the above assessment and plan.  Charlynn Court, NP Palliative Medicine Team [phone] pager (7a-5p) Team Phone # [phone]

## 2017-08-05 NOTE — Progress Notes (Signed)
PROGRESS NOTE    Joshua [name]  HYQ:[id number] DOB: [birth year] DOA: 08/03/2017 PCP: [name], MD     Brief Narrative:  Joshua [name] is a 71 y.o. male with medical history significant of cirrhosis, anemia, Hepatitis C, HTN. He was recently discharged from hospital on 07/21/2017 after a 3 day admission for acute hepatic encephalopathy. History is gathered by patient's sister at bedside. She states that she saw him at home on the following day after discharge, patient was not back to his baseline. She is unsure if he has been having more than 3 bowel movements daily as she has not been keeping track. She has been administering lactulose as directed. Patient was brought back to the hospital; ammonia found to be elevated.  Assessment & Plan:   Active Problems:   Cirrhosis ([place])   Hepatitis C   HTN (hypertension)   Hyperammonemia ([facility])   Acute hepatic encephalopathy   Hyponatremia   Nonhealing nonsurgical wound   AKI (acute kidney injury) ([name])   GERD (gastroesophageal reflux disease)   Goals of care, counseling/discussion   Palliative care by specialist   Hepatic encephalopathy -Continue lactulose QID, add Xifaxan -Given lactulose enema x 1 yesterday with 3 BM this morning  -Improved   Cirrhosis with thrombocytopenia, hx Hep C  -Treated with Harvoni in 2015, followed with [facility] and liver transplant but does not appear to have followed up with anyone since 2015  -MELD 22  -Hold spironolactone, Lasix due to AKI and hyponatremia   Acute kidney injury -? Hepatorenal -Baseline Cr 1 -Improved. Stop IVF today, monitor PO intake, resume diuretic tomorrow if labs stable   Asymptomatic pyuria -Patient without fevers, elevated leukocytosis, or urinary complaints -Urine culture pending   Hyponatremia -Improved with IV fluid -Trend  Chronic bilateral lower extremity wounds -Without evidence of acute infection, recently finished Bactrim in the outpatient  setting -Wound RN consult   Goals of care -Palliative care consulted due to patient's poor overall prognosis, as well as recurrent admissions    DVT prophylaxis: Unable to give anticoag due to thrombocytopenia, no SCD due to bilateral LE wounds  Code Status: DNR Family Communication: Sister at bedside Disposition Plan: Pending improvement, SNF    Consultants:   None  Procedures:   None  Antimicrobials:  Anti-infectives    Start     Dose/Rate Route Frequency Ordered Stop   08/03/17 1400  rifaximin (XIFAXAN) tablet 400 mg     400 mg Oral 3 times daily 08/03/17 1206         Subjective: Patient much more alert and awake today. He has very poor insight into his medical comorbidities. He has no physical complaints, denies any recent illnesses, chest pain, abdominal pain. He had some bowel movements this morning. He tolerated breakfast today.  Objective: Vitals:   08/04/17 0444 08/04/17 1443 08/04/17 2144 08/05/17 0552  BP: 124/61 (!) 104/58 127/61 (!) 120/54  Pulse: 82 88 79 75  Resp: 18 17 20 18   Temp: (!) 97.5 F (36.4 C) 98 F (36.7 C) 98 F (36.7 C) 98.1 F (36.7 C)  TempSrc: Oral  Oral Oral  SpO2: 97% 97% 100% 100%    Intake/Output Summary (Last 24 hours) at 08/05/17 1409 Last data filed at 08/05/17 1042  Gross per 24 hour  Intake          2596.66 ml  Output             1750 ml  Net  846.66 ml   There were no vitals filed for this visit.  Examination:  General exam: Appears calm and comfortable Respiratory system: Clear to auscultation. Respiratory effort normal. Cardiovascular system: S1 & S2 heard, RRR. +systolic murmur, rubs, gallops or clicks.+pedal edema. Gastrointestinal system: Abdomen is nondistended, soft and nontender. No organomegaly or masses felt. Normal bowel sounds heard. Central nervous system: Alert, oriented, nonfocal exam  Extremities: Symmetric  Skin: +right shin with superficial wound, +left shin with larger necrotic base  wound   Data Reviewed: I have personally reviewed following labs and imaging studies  CBC:  Recent Labs Lab 08/03/17 0928 08/04/17 0617 08/05/17 0431  WBC 5.1 4.6 4.8  NEUTROABS  --   --  3.2  HGB 12.8* 11.3* 13.4  HCT 36.6* 33.1* 37.3*  MCV 90.1 89.9 90.8  PLT 69* 73* 69*   Basic Metabolic Panel:  Recent Labs Lab 08/03/17 0928 08/03/17 1851 08/04/17 0617 08/05/17 0431  NA 124* 127* 128* 132*  K 5.3* 4.7 4.6 4.0  CL 96* 99* 101 105  CO2 18* 17* 18* 17*  GLUCOSE 85 99 80 79  BUN 16 17 15 12   CREATININE 1.47* 1.44* 1.30* 1.15  CALCIUM 8.6* 8.7* 8.2* 8.2*  MG  --   --   --  2.0   GFR: CrCl cannot be calculated (Unknown ideal weight.). Liver Function Tests:  Recent Labs Lab 08/03/17 0928 08/03/17 1851 08/04/17 0617 08/05/17 0431  AST 42* 39 37 45*  ALT 35 33 29 32  ALKPHOS 73 73 65 70  BILITOT 1.9* 1.9* 1.9* 2.0*  PROT 5.5* 5.5* 4.7* 5.2*  ALBUMIN 3.0* 3.0* 2.6* 2.8*   No results for input(s): LIPASE, AMYLASE in the last 168 hours.  Recent Labs Lab 08/03/17 0917 08/03/17 1851 08/04/17 0617  AMMONIA 138* 88* 139*   Coagulation Profile: No results for input(s): INR, PROTIME in the last 168 hours. Cardiac Enzymes: No results for input(s): CKTOTAL, CKMB, CKMBINDEX, TROPONINI in the last 168 hours. BNP (last 3 results) No results for input(s): PROBNP in the last 8760 hours. HbA1C: No results for input(s): HGBA1C in the last 72 hours. CBG: No results for input(s): GLUCAP in the last 168 hours. Lipid Profile: No results for input(s): CHOL, HDL, LDLCALC, TRIG, CHOLHDL, LDLDIRECT in the last 72 hours. Thyroid Function Tests: No results for input(s): TSH, T4TOTAL, FREET4, T3FREE, THYROIDAB in the last 72 hours. Anemia Panel: No results for input(s): VITAMINB12, FOLATE, FERRITIN, TIBC, IRON, RETICCTPCT in the last 72 hours. Sepsis Labs: No results for input(s): PROCALCITON, LATICACIDVEN in the last 168 hours.  No results found for this or any previous  visit (from the past 240 hour(s)).     Radiology Studies: Dg Abd 1 View  Result Date: 08/04/2017 CLINICAL DATA:  71 year old male constipation. Cirrhosis. Initial encounter. EXAM: ABDOMEN - 1 VIEW COMPARISON:  None. FINDINGS: Bowel gas pattern unremarkable without plain film findings to suggest bowel obstruction. The possibility of free intraperitoneal air cannot be assessed on a supine view. Degenerative changes lumbar spine. IMPRESSION: No plain film evidence bowel obstruction. Electronically Signed   By: Genia Del M.D.   On: 08/04/2017 15:58      Scheduled Meds: . collagenase   Topical Daily  . lactulose  30 g Oral QID  . pantoprazole  40 mg Oral Daily  . polyvinyl alcohol  1 drop Both Eyes QHS  . rifaximin  400 mg Oral TID   Continuous Infusions:    LOS: 2 days    Time spent:  30 minutes   Dessa Phi, DO [facility] [email or web address] Password [email or web address] 08/05/2017, 2:09 PM

## 2017-08-05 NOTE — Progress Notes (Signed)
CSW spoke with patient's brother and sister. They had questions regarding placement options and where to go from discharge. CSW answered questions and offered SNF list. They will provide choice tomorrow.   Percell Locus Cordel Drewes LCSWA [phone]

## 2017-08-06 LAB — COMPREHENSIVE METABOLIC PANEL
ALT: 35 U/L (ref 17–63)
AST: 50 U/L — AB (ref 15–41)
Albumin: 2.7 g/dL — ABNORMAL LOW (ref 3.5–5.0)
Alkaline Phosphatase: 73 U/L (ref 38–126)
Anion gap: 9 (ref 5–15)
BILIRUBIN TOTAL: 1.9 mg/dL — AB (ref 0.3–1.2)
BUN: 12 mg/dL (ref 6–20)
CO2: 18 mmol/L — ABNORMAL LOW (ref 22–32)
Calcium: 8.3 mg/dL — ABNORMAL LOW (ref 8.9–10.3)
Chloride: 105 mmol/L (ref 101–111)
Creatinine, Ser: 1 mg/dL (ref 0.61–1.24)
GFR calc Af Amer: >60 mL/min (ref >60)
Glucose, Bld: 84 mg/dL (ref 65–99)
POTASSIUM: 4.2 mmol/L (ref 3.5–5.1)
Sodium: 132 mmol/L — ABNORMAL LOW (ref 135–145)
TOTAL PROTEIN: 5.4 g/dL — AB (ref 6.5–8.1)

## 2017-08-06 LAB — CBC WITH DIFFERENTIAL/PLATELET
Basophils Absolute: 0 10*3/uL (ref 0.0–0.1)
Basophils Relative: 1 %
Eosinophils Absolute: 0.2 10*3/uL (ref 0.0–0.7)
Eosinophils Relative: 4 %
HEMATOCRIT: 35.9 % — AB (ref 39.0–52.0)
Hemoglobin: 12.1 g/dL — ABNORMAL LOW (ref 13.0–17.0)
LYMPHS ABS: 1.3 10*3/uL (ref 0.7–4.0)
LYMPHS PCT: 24 %
MCH: 30.8 pg (ref 26.0–34.0)
MCHC: 33.7 g/dL (ref 30.0–36.0)
MCV: 91.3 fL (ref 78.0–100.0)
MONO ABS: 0.5 10*3/uL (ref 0.1–1.0)
MONOS PCT: 9 %
NEUTROS ABS: 3.3 10*3/uL (ref 1.7–7.7)
Neutrophils Relative %: 63 %
Platelets: 81 10*3/uL — ABNORMAL LOW (ref 150–400)
RBC: 3.93 MIL/uL — ABNORMAL LOW (ref 4.22–5.81)
RDW: 15.1 % (ref 11.5–15.5)
WBC: 5.3 10*3/uL (ref 4.0–10.5)

## 2017-08-06 LAB — AMMONIA: AMMONIA: 68 umol/L — AB (ref 9–35)

## 2017-08-06 MED ORDER — FUROSEMIDE 20 MG PO TABS
20.0000 mg | ORAL_TABLET | Freq: Every day | ORAL | Status: DC
  Administered 2017-08-06 – 2017-08-07 (×2): 20 mg via ORAL
  Filled 2017-08-06 (×2): qty 1

## 2017-08-06 MED ORDER — SPIRONOLACTONE 25 MG PO TABS
50.0000 mg | ORAL_TABLET | Freq: Every day | ORAL | Status: DC
  Administered 2017-08-06 – 2017-08-07 (×2): 50 mg via ORAL
  Filled 2017-08-06 (×2): qty 2

## 2017-08-06 MED ORDER — DEXTROSE 5 % IV SOLN
1.0000 g | INTRAVENOUS | Status: DC
  Administered 2017-08-06 – 2017-08-07 (×2): 1 g via INTRAVENOUS
  Filled 2017-08-06 (×2): qty 10

## 2017-08-06 NOTE — Progress Notes (Signed)
PROGRESS NOTE    Joshua [name]  GUR:[id number] DOB: 1945-10-29 DOA: 08/03/2017 PCP: [name], MD     Brief Narrative:  Joshua [name] is a 71 y.o. male with medical history significant of cirrhosis, anemia, Hepatitis C, HTN. He was recently discharged from hospital on 07/21/2017 after a 3 day admission for acute hepatic encephalopathy. History is gathered by patient's sister at bedside. She states that she saw him at home on the following day after discharge, patient was not back to his baseline. She is unsure if he has been having more than 3 bowel movements daily as she has not been keeping track. She has been administering lactulose as directed. Patient was brought back to the hospital; ammonia found to be elevated.  Assessment & Plan:   Active Problems:   Cirrhosis ([address])   Hepatitis C   HTN (hypertension)   Hyperammonemia ([facility])   Acute hepatic encephalopathy   Hyponatremia   Nonhealing nonsurgical wound   AKI (acute kidney injury) ([place])   GERD (gastroesophageal reflux disease)   Goals of care, counseling/discussion   Palliative care by specialist   Hepatic encephalopathy -Continue lactulose QID, continue Rifaximin -Improved, ammonia is improving  Cirrhosis with thrombocytopenia, hx Hep C  -Treated with Harvoni in 2015, followed with [facility] and liver transplant but does not appear to have followed up with anyone since 2015  -MELD 22  -Hold spironolactone, Lasix due to AKI and hyponatremia   Acute kidney injury -? Hepatorenal -Baseline Cr 1 -Improved.  Status post IV fluids, these were discontinued on 10/12, resume Lasix and spironolactone at lower doses today  Asymptomatic pyuria -Patient without fevers, elevated leukocytosis, or urinary complaints -Urine culture pending, but given potential contributor to mental status will do empiric ceftriaxone  Hyponatremia -Improved with IV fluid -Trend  Chronic bilateral lower extremity wounds -Without evidence  of acute infection, recently finished Bactrim in the outpatient setting -Wound RN consult   Goals of care -Palliative care consulted due to patient's poor overall prognosis, as well as recurrent admissions    DVT prophylaxis: Unable to give anticoag due to thrombocytopenia, no SCD due to bilateral LE wounds  Code Status: DNR Family Communication: Sister at bedside Disposition Plan: Pending improvement, SNF    Consultants:   None  Procedures:   None  Antimicrobials:  Ceftriaxone 10/13>>  Subjective: - no chest pain, shortness of breath, no abdominal pain, nausea or vomiting.  Wants Dulcolax  Objective: Vitals:   08/05/17 1432 08/05/17 2105 08/06/17 0100 08/06/17 0559  BP: 130/61 (!) 158/81  121/63  Pulse: 84 86  68  Resp: 18 20  20   Temp: 98 F (36.7 C) 97.6 F (36.4 C)  97.8 F (36.6 C)  TempSrc:  Oral  Oral  SpO2: 100% 100%  100%  Weight:   71.8 kg (158 lb 4.8 oz)   Height:   5\' 6"  (1.676 m)    No intake or output data in the 24 hours ending 08/06/17 1355 Filed Weights   08/06/17 0100  Weight: 71.8 kg (158 lb 4.8 oz)    Examination:  Constitutional: NAD, calm, comfortable Eyes: lids and conjunctivae normal ENMT: Mucous membranes are moist.  Neck: normal, supple Respiratory: clear to auscultation bilaterally, no wheezing, no crackles. Normal respiratory effort.  Cardiovascular: Regular rate and rhythm, no murmurs / rubs / gallops.  Abdomen: no tenderness. Bowel sounds positive.  Skin: right shin with superficial wound, left shin with larger necrotic base wound Neurologic: non focal  Data Reviewed: I  have personally reviewed following labs and imaging studies  CBC:  Recent Labs Lab 08/03/17 0928 08/04/17 0617 08/05/17 0431 08/06/17 0535  WBC 5.1 4.6 4.8 5.3  NEUTROABS  --   --  3.2 3.3  HGB 12.8* 11.3* 13.4 12.1*  HCT 36.6* 33.1* 37.3* 35.9*  MCV 90.1 89.9 90.8 91.3  PLT 69* 73* 69* 81*   Basic Metabolic Panel:  Recent Labs Lab  08/03/17 0928 08/03/17 1851 08/04/17 0617 08/05/17 0431 08/06/17 0535  NA 124* 127* 128* 132* 132*  K 5.3* 4.7 4.6 4.0 4.2  CL 96* 99* 101 105 105  CO2 18* 17* 18* 17* 18*  GLUCOSE 85 99 80 79 84  BUN 16 17 15 12 12   CREATININE 1.47* 1.44* 1.30* 1.15 1.00  CALCIUM 8.6* 8.7* 8.2* 8.2* 8.3*  MG  --   --   --  2.0  --    GFR: Estimated Creatinine Clearance: 59.4 mL/min (by C-G formula based on SCr of 1 mg/dL). Liver Function Tests:  Recent Labs Lab 08/03/17 0928 08/03/17 1851 08/04/17 0617 08/05/17 0431 08/06/17 0535  AST 42* 39 37 45* 50*  ALT 35 33 29 32 35  ALKPHOS 73 73 65 70 73  BILITOT 1.9* 1.9* 1.9* 2.0* 1.9*  PROT 5.5* 5.5* 4.7* 5.2* 5.4*  ALBUMIN 3.0* 3.0* 2.6* 2.8* 2.7*   No results for input(s): LIPASE, AMYLASE in the last 168 hours.  Recent Labs Lab 08/03/17 0917 08/03/17 1851 08/04/17 0617 08/06/17 0957  AMMONIA 138* 88* 139* 68*   Coagulation Profile: No results for input(s): INR, PROTIME in the last 168 hours. Cardiac Enzymes: No results for input(s): CKTOTAL, CKMB, CKMBINDEX, TROPONINI in the last 168 hours. BNP (last 3 results) No results for input(s): PROBNP in the last 8760 hours. HbA1C: No results for input(s): HGBA1C in the last 72 hours. CBG: No results for input(s): GLUCAP in the last 168 hours. Lipid Profile: No results for input(s): CHOL, HDL, LDLCALC, TRIG, CHOLHDL, LDLDIRECT in the last 72 hours. Thyroid Function Tests: No results for input(s): TSH, T4TOTAL, FREET4, T3FREE, THYROIDAB in the last 72 hours. Anemia Panel: No results for input(s): VITAMINB12, FOLATE, FERRITIN, TIBC, IRON, RETICCTPCT in the last 72 hours. Sepsis Labs: No results for input(s): PROCALCITON, LATICACIDVEN in the last 168 hours.  Recent Results (from the past 240 hour(s))  Culture, Urine     Status: Abnormal (Preliminary result)   Collection Time: 08/05/17  3:06 PM  Result Value Ref Range Status   Specimen Description URINE, RANDOM  Final   Special  Requests NONE  Final   Culture (A)  Final    >=100,000 COLONIES/mL STAPHYLOCOCCUS SPECIES (COAGULASE NEGATIVE)   Report Status PENDING  Incomplete       Radiology Studies: Dg Abd 1 View  Result Date: 08/04/2017 CLINICAL DATA:  71 year old male constipation. Cirrhosis. Initial encounter. EXAM: ABDOMEN - 1 VIEW COMPARISON:  None. FINDINGS: Bowel gas pattern unremarkable without plain film findings to suggest bowel obstruction. The possibility of free intraperitoneal air cannot be assessed on a supine view. Degenerative changes lumbar spine. IMPRESSION: No plain film evidence bowel obstruction. Electronically Signed   By: Genia Del M.D.   On: 08/04/2017 15:58      Scheduled Meds: . collagenase   Topical Daily  . lactulose  30 g Oral QID  . pantoprazole  40 mg Oral Daily  . polyvinyl alcohol  1 drop Both Eyes QHS  . rifaximin  400 mg Oral TID   Continuous Infusions: . cefTRIAXone (ROCEPHIN)  IV Stopped (08/06/17 1049)     LOS: 3 days   Duchess Armendarez M. Cruzita Lederer, MD [facility] [phone]  If 7PM-7AM, please contact night-coverage [email or web address] Password [email or web address]

## 2017-08-07 DIAGNOSIS — B1920 Unspecified viral hepatitis C without hepatic coma: Secondary | ICD-10-CM | POA: Diagnosis not present

## 2017-08-07 DIAGNOSIS — K7460 Unspecified cirrhosis of liver: Secondary | ICD-10-CM | POA: Diagnosis not present

## 2017-08-07 DIAGNOSIS — K219 Gastro-esophageal reflux disease without esophagitis: Secondary | ICD-10-CM

## 2017-08-07 DIAGNOSIS — N289 Disorder of kidney and ureter, unspecified: Secondary | ICD-10-CM | POA: Diagnosis not present

## 2017-08-07 DIAGNOSIS — W19XXXD Unspecified fall, subsequent encounter: Secondary | ICD-10-CM | POA: Diagnosis not present

## 2017-08-07 DIAGNOSIS — N179 Acute kidney failure, unspecified: Secondary | ICD-10-CM | POA: Diagnosis not present

## 2017-08-07 DIAGNOSIS — K7200 Acute and subacute hepatic failure without coma: Secondary | ICD-10-CM | POA: Diagnosis not present

## 2017-08-07 DIAGNOSIS — R278 Other lack of coordination: Secondary | ICD-10-CM | POA: Diagnosis not present

## 2017-08-07 DIAGNOSIS — K7469 Other cirrhosis of liver: Secondary | ICD-10-CM | POA: Diagnosis not present

## 2017-08-07 DIAGNOSIS — K7290 Hepatic failure, unspecified without coma: Secondary | ICD-10-CM | POA: Diagnosis not present

## 2017-08-07 DIAGNOSIS — N390 Urinary tract infection, site not specified: Secondary | ICD-10-CM | POA: Diagnosis not present

## 2017-08-07 DIAGNOSIS — R531 Weakness: Secondary | ICD-10-CM | POA: Diagnosis not present

## 2017-08-07 DIAGNOSIS — G9349 Other encephalopathy: Secondary | ICD-10-CM | POA: Diagnosis not present

## 2017-08-07 DIAGNOSIS — S81802A Unspecified open wound, left lower leg, initial encounter: Secondary | ICD-10-CM | POA: Diagnosis not present

## 2017-08-07 DIAGNOSIS — R41841 Cognitive communication deficit: Secondary | ICD-10-CM | POA: Diagnosis not present

## 2017-08-07 DIAGNOSIS — M6281 Muscle weakness (generalized): Secondary | ICD-10-CM | POA: Diagnosis not present

## 2017-08-07 DIAGNOSIS — E871 Hypo-osmolality and hyponatremia: Secondary | ICD-10-CM | POA: Diagnosis not present

## 2017-08-07 DIAGNOSIS — G9340 Encephalopathy, unspecified: Secondary | ICD-10-CM | POA: Diagnosis not present

## 2017-08-07 DIAGNOSIS — S81802D Unspecified open wound, left lower leg, subsequent encounter: Secondary | ICD-10-CM | POA: Diagnosis not present

## 2017-08-07 DIAGNOSIS — S81801D Unspecified open wound, right lower leg, subsequent encounter: Secondary | ICD-10-CM | POA: Diagnosis not present

## 2017-08-07 DIAGNOSIS — D649 Anemia, unspecified: Secondary | ICD-10-CM | POA: Diagnosis not present

## 2017-08-07 DIAGNOSIS — K922 Gastrointestinal hemorrhage, unspecified: Secondary | ICD-10-CM | POA: Diagnosis not present

## 2017-08-07 DIAGNOSIS — L97211 Non-pressure chronic ulcer of right calf limited to breakdown of skin: Secondary | ICD-10-CM | POA: Diagnosis not present

## 2017-08-07 DIAGNOSIS — R600 Localized edema: Secondary | ICD-10-CM | POA: Diagnosis not present

## 2017-08-07 DIAGNOSIS — I10 Essential (primary) hypertension: Secondary | ICD-10-CM | POA: Diagnosis not present

## 2017-08-07 DIAGNOSIS — S81801A Unspecified open wound, right lower leg, initial encounter: Secondary | ICD-10-CM | POA: Diagnosis not present

## 2017-08-07 DIAGNOSIS — E7220 Disorder of urea cycle metabolism, unspecified: Secondary | ICD-10-CM | POA: Diagnosis not present

## 2017-08-07 DIAGNOSIS — L97228 Non-pressure chronic ulcer of left calf with other specified severity: Secondary | ICD-10-CM | POA: Diagnosis not present

## 2017-08-07 DIAGNOSIS — B182 Chronic viral hepatitis C: Secondary | ICD-10-CM | POA: Diagnosis not present

## 2017-08-07 DIAGNOSIS — R2689 Other abnormalities of gait and mobility: Secondary | ICD-10-CM | POA: Diagnosis not present

## 2017-08-07 LAB — BASIC METABOLIC PANEL
ANION GAP: 8 (ref 5–15)
BUN: 12 mg/dL (ref 6–20)
CALCIUM: 7.9 mg/dL — AB (ref 8.9–10.3)
CO2: 19 mmol/L — AB (ref 22–32)
CREATININE: 0.86 mg/dL (ref 0.61–1.24)
Chloride: 104 mmol/L (ref 101–111)
GFR calc Af Amer: >60 mL/min (ref >60)
GLUCOSE: 80 mg/dL (ref 65–99)
Potassium: 3.5 mmol/L (ref 3.5–5.1)
Sodium: 131 mmol/L — ABNORMAL LOW (ref 135–145)

## 2017-08-07 LAB — URINE CULTURE: Culture: >=100000 — AB

## 2017-08-07 LAB — CBC
HCT: 34.2 % — ABNORMAL LOW (ref 39.0–52.0)
Hemoglobin: 11.6 g/dL — ABNORMAL LOW (ref 13.0–17.0)
MCH: 30.9 pg (ref 26.0–34.0)
MCHC: 33.9 g/dL (ref 30.0–36.0)
MCV: 91.2 fL (ref 78.0–100.0)
Platelets: 78 10*3/uL — ABNORMAL LOW (ref 150–400)
RBC: 3.75 MIL/uL — ABNORMAL LOW (ref 4.22–5.81)
RDW: 15 % (ref 11.5–15.5)
WBC: 4.5 10*3/uL (ref 4.0–10.5)

## 2017-08-07 MED ORDER — SULFAMETHOXAZOLE-TRIMETHOPRIM 800-160 MG PO TABS: 1.0000 | ORAL_TABLET | Freq: Two times a day (BID) | ORAL | Status: DC

## 2017-08-07 MED ORDER — DOXYCYCLINE HYCLATE 100 MG PO CAPS: 100.0000 mg | ORAL_CAPSULE | Freq: Two times a day (BID) | ORAL | 0 refills | Status: DC

## 2017-08-07 MED ORDER — RIFAXIMIN 200 MG PO TABS: 400.0000 mg | ORAL_TABLET | Freq: Three times a day (TID) | ORAL | Status: AC

## 2017-08-07 MED ORDER — BLISTEX MEDICATED EX OINT
TOPICAL_OINTMENT | CUTANEOUS | Status: DC | PRN
  Administered 2017-08-07: 06:00:00 via TOPICAL
  Filled 2017-08-07: qty 6.3

## 2017-08-07 MED ORDER — FUROSEMIDE 40 MG PO TABS: 40.0000 mg | ORAL_TABLET | Freq: Every day | ORAL | 9 refills | Status: DC

## 2017-08-07 MED ORDER — SPIRONOLACTONE 100 MG PO TABS: 100.0000 mg | ORAL_TABLET | Freq: Every day | ORAL | Status: AC

## 2017-08-07 NOTE — Discharge Summary (Signed)
Physician Discharge Summary  Joshua [name]:[id number] DOB: 04-06-1946 DOA: 08/03/2017  PCP: Joshua Seashore, MD  Admit date: 08/03/2017 Discharge date: 08/07/2017  Admitted From: home Disposition:  SNF  Recommendations for Outpatient Follow-up:  1. Follow up with PCP in 1-2 weeks 2. Continue Doxycycline for 5 days  Home Health: none Equipment/Devices: none  Discharge Condition: stable CODE STATUS: DNR Diet recommendation: regular  HPI: Per Dr. Marily [name], Joshua [name] is a 71 y.o. male with medical history significant of CIrrhosis, anemia, Hepatitis C, HTN. Of note patient was discharged from his current hospital 07/21/2017 after a 3 day admission for acute hepatic encephalopathy secondary to elevated ammonia levels. Since time of discharge patient's mental status has never been completely back to his baseline. Level V caveat applies this patient has acutely altered. History provided per Joshua [name] by patient's sister who is his primary caregiver. Patient has had waxing and waning periods of mentation since discharge. Family was in the process of getting patient placed in an assisted living facility prior to current admission. Patient's mentation definitely is affected by certain medications such as Ambien which make it worse and disruption in his normal daily process. Patient has had issues recently with inappropriate behavior in public, eating spoiled food that the patient was normal, and other general confusion type symptoms. There are no reported fevers, chest pain, abdominal pain, dysuria, frequency, flank pain, shortness breath, palpitations, rash. Patient sister assists patient in education administration and states compliance. Lately they have been giving him 45 ML's or 30 g of lactulose 3 times a day. He has not had a bowel movement in greater than one day prior to admission. 24 hours prior to admission patient became significantly more confused with no improvement after  sleeping.   Hospital Course: Discharge Diagnoses:  Active Problems:   Cirrhosis ([place])   Hepatitis C   HTN (hypertension)   Hyperammonemia (H[facility])   Acute hepatic encephalopathy   Hyponatremia   Nonhealing nonsurgical wound   AKI (acute kidney injury) ([place])   GERD (gastroesophageal reflux disease)   Goals of care, counseling/discussion   Palliative care by specialist   Hepatic encephalopathy -in the setting of constipation at home, possible UTI, he was started on lactulose and Rifaximin, improved, now AxOx4 and back to baseline. Continue lactulose, titrate for 2-3 loose BMs daily. Continue Rifaximin Cirrhosis with thrombocytopenia, hx Hep C -Treated with Harvoni in 2015, followed with [facility] and liver transplant but does not appear to have followed up with anyone since 2015. MELD 22  Acute kidney injury -? Hepatorenal, Baseline Cr 1, Improved.  Status post IV fluids, these were discontinued on 10/12, resumed Lasix and spironolactone and renal function has remained stable.  Asymptomatic pyuria -Patient without fevers, elevated leukocytosis, or urinary complaints, Urine culture with coag negative staph, but given potential contributor to mental status will treat with Dooxycycline for 5 more days  Hyponatremia -Improved with IV fluid Chronic bilateral lower extremity wounds -Without evidence of acute infection, recently finished Bactrim in the outpatient setting, Doxycycline as above Goals of care -Palliative care consulted due to patient's poor overall prognosis, as well as recurrent admissions, recommend palliative follow up at [facility]   Discharge Instructions   Allergies as of 08/07/2017      Reactions   Codeine Itching      Medication List    STOP taking these medications   sulfamethoxazole-trimethoprim 800-160 MG tablet Commonly known as:  BACTRIM DS,SEPTRA DS   zolpidem 5 MG tablet Commonly known as:  AMBIEN     TAKE these medications   amphetamine-dextroamphetamine 10 MG  tablet Commonly known as:  ADDERALL Take 20 mg by mouth daily.   bisacodyl 5 MG EC tablet Commonly known as:  DULCOLAX Take 5 mg by mouth daily as needed for moderate constipation.   doxycycline 100 MG capsule Commonly known as:  VIBRAMYCIN Take 1 capsule (100 mg total) by mouth 2 (two) times daily.   furosemide 40 MG tablet Commonly known as:  LASIX Take 1 tablet (40 mg total) by mouth daily.   lactulose 10 GM/15ML solution Commonly known as:  CHRONULAC Take 30 g by mouth 3 (three) times daily.   multivitamin with minerals tablet Take 1 tablet by mouth daily.   pantoprazole 40 MG tablet Commonly known as:  PROTONIX Take 40 mg by mouth daily.   PREVIDENT 0.2 % Soln Generic drug:  SODIUM FLUORIDE (DENTAL RINSE) Place 1 mL onto teeth See admin instructions. Rinse with small amount for one minute and then spit; do not eat, drink or rinse for 30 minutes after using   rifaximin 200 MG tablet Commonly known as:  XIFAXAN Take 2 tablets (400 mg total) by mouth 3 (three) times daily.   spironolactone 100 MG tablet Commonly known as:  ALDACTONE Take 1 tablet (100 mg total) by mouth daily. What changed:  when to take this   STOOL SOFTENER PO Take 1 tablet by mouth daily as needed (constipation).   SYSTANE OP Place 1 drop into both eyes at bedtime.        Consultations:  None   Procedures/Studies:  Dg Chest 2 View  Result Date: 07/18/2017 CLINICAL DATA:  Initial evaluation for acute altered mental status. EXAM: CHEST  2 VIEW COMPARISON:  Prior radiograph from 08/23/2014. FINDINGS: Cardiac and mediastinal silhouettes are stable in size and contour, and remain within normal limits. Lungs hypoinflated. Linear opacity at the left lung base most consistent with atelectasis and/or scar. Underlying emphysematous changes. No focal infiltrates. No pulmonary edema or pleural effusion. No pneumothorax. There is question of 2 adjacent nodular densities overlying the peripheral right  lung measuring up to approximately 2 cm each. While these are favored to reflect summation of overlying osseous shadows, underlying nodules not entirely excluded. These are not seen on lateral projection. No acute osseus abnormality.  Osteopenia. IMPRESSION: 1. Shallow lung inflation with mild left basilar atelectasis and/or scarring. No other active cardiopulmonary disease. 2. Two adjacent approximate 2 cm nodular densities overlying the peripheral right lower lung. While one and/or both of these are favored to reflect summation of overlying osseous shadows, underlying pulmonary nodules are not entirely excluded. Follow-up examination with dedicated cross-sectional imaging of the chest recommended for further characterization. 3. Underlying emphysema. Electronically Signed   By: Jeannine Boga M.D.   On: 07/18/2017 18:45   Dg Abd 1 View  Result Date: 08/04/2017 CLINICAL DATA:  71 year old male constipation. Cirrhosis. Initial encounter. EXAM: ABDOMEN - 1 VIEW COMPARISON:  None. FINDINGS: Bowel gas pattern unremarkable without plain film findings to suggest bowel obstruction. The possibility of free intraperitoneal air cannot be assessed on a supine view. Degenerative changes lumbar spine. IMPRESSION: No plain film evidence bowel obstruction. Electronically Signed   By: Genia Del M.D.   On: 08/04/2017 15:58   Ct Head Wo Contrast  Result Date: 07/18/2017 CLINICAL DATA:  Initial evaluation for acute altered mental status. EXAM: CT HEAD WITHOUT CONTRAST TECHNIQUE: Contiguous axial images were obtained from the base of the skull through the vertex without intravenous  contrast. COMPARISON:  Prior CT from 08/23/2014. FINDINGS: Brain: Generalized age-related cerebral atrophy with chronic microvascular ischemic disease. Remote right frontal and occipital lobe infarcts noted. No acute intracranial hemorrhage. No evidence for acute large vessel territory infarct. No mass lesion, midline shift or mass  effect. No hydrocephalus. No extra-axial fluid collection. Vascular: No hyperdense vessel. Scattered vascular calcifications noted within the carotid siphons. Skull: Scalp soft tissues and calvarium within normal limits. Sinuses/Orbits: Globes oral soft tissues normal. Patient status post lens extraction bilaterally. Mild scattered mucosal thickening within the ethmoidal air cells. Paranasal sinuses otherwise clear. No mastoid effusion. Other: None. IMPRESSION: 1. No acute intracranial process. 2. Remote right frontal and occipital lobe infarcts. 3. Generalized cerebral atrophy with mild to moderate chronic small vessel ischemic disease. Electronically Signed   By: Jeannine Boga M.D.   On: 07/18/2017 18:38   Ct Chest W Contrast  Result Date: 08/01/2017 CLINICAL DATA:  Nodule follow-up EXAM: CT CHEST WITH CONTRAST TECHNIQUE: Multidetector CT imaging of the chest was performed during intravenous contrast administration. CONTRAST:  40mL ISOVUE-300 IOPAMIDOL (ISOVUE-300) INJECTION 61% COMPARISON:  Chest x-ray 07/18/2017 FINDINGS: Cardiovascular: Heart is normal size. Diffuse coronary artery calcifications. Moderate aortic calcifications. No aneurysm. Mediastinum/Nodes: No mediastinal, hilar, or axillary adenopathy. Esophagus is fluid-filled. There is mild distal esophageal wall thickening. Distal esophageal varices noted. Lungs/Pleura: Linear areas of scarring in the lung bases bilaterally. No confluent airspace opacities otherwise. No effusions or suspicious nodules. No abnormality in the area concern in the right lung on prior chest x-ray. This likely reflected overlapping shadows. Upper Abdomen: Changes of cirrhosis noted. Associated upper abdominal varices, spontaneous splenorenal shunt, and splenomegaly. Layering gallstones within the gallbladder. Musculoskeletal: Bilateral gynecomastia.Compression deformity noted at the T12 vertebral body, age indeterminate. IMPRESSION: No suspicious pulmonary nodules  in the lungs as questioned on prior chest x-ray. Bibasilar scarring. Coronary artery disease, aortic atherosclerosis. Distal esophageal wall thickening may reflect esophagitis. Cirrhosis with associated upper abdominal varices, esophageal varices, and splenomegaly. Cholelithiasis. Electronically Signed   By: Rolm Baptise M.D.   On: 08/01/2017 09:43      Subjective: - no chest pain, shortness of breath, no abdominal pain, nausea or vomiting.   Discharge Exam: Vitals:   08/06/17 2140 08/07/17 0545  BP: 113/66 (!) 115/59  Pulse: 81 70  Resp: 18 18  Temp: 98.4 F (36.9 C) 98.3 F (36.8 C)  SpO2: 98% 100%    General: Pt is alert, awake, not in acute distress Cardiovascular: RRR, S1/S2 +, no rubs, no gallops Respiratory: CTA bilaterally, no wheezing, no rhonchi Abdominal: Soft, NT, ND, bowel sounds + Extremities: no edema, no cyanosis    The results of significant diagnostics from this hospitalization (including imaging, microbiology, ancillary and laboratory) are listed below for reference.     Microbiology: Recent Results (from the past 240 hour(s))  Culture, Urine     Status: Abnormal   Collection Time: 08/05/17  3:06 PM  Result Value Ref Range Status   Specimen Description URINE, RANDOM  Final   Special Requests NONE  Final   Culture (A)  Final    >=100,000 COLONIES/mL STAPHYLOCOCCUS SPECIES (COAGULASE NEGATIVE)   Report Status 08/07/2017 FINAL  Final   Organism ID, Bacteria STAPHYLOCOCCUS SPECIES (COAGULASE NEGATIVE) (A)  Final      Susceptibility   Staphylococcus species (coagulase negative) - MIC*    CIPROFLOXACIN >=8 RESISTANT Resistant     GENTAMICIN <=0.5 SENSITIVE Sensitive     NITROFURANTOIN <=16 SENSITIVE Sensitive     OXACILLIN >=4 RESISTANT Resistant  TETRACYCLINE 2 SENSITIVE Sensitive     VANCOMYCIN 2 SENSITIVE Sensitive     TRIMETH/SULFA 160 RESISTANT Resistant     CLINDAMYCIN 4 RESISTANT Resistant     RIFAMPIN 8 RESISTANT Resistant     Inducible  Clindamycin NEGATIVE Sensitive     * >=100,000 COLONIES/mL STAPHYLOCOCCUS SPECIES (COAGULASE NEGATIVE)     Labs: BNP (last 3 results) No results for input(s): BNP in the last 8760 hours. Basic Metabolic Panel:  Recent Labs Lab 08/03/17 1851 08/04/17 0617 08/05/17 0431 08/06/17 0535 08/07/17 0523  NA 127* 128* 132* 132* 131*  K 4.7 4.6 4.0 4.2 3.5  CL 99* 101 105 105 104  CO2 17* 18* 17* 18* 19*  GLUCOSE 99 80 79 84 80  BUN 17 15 12 12 12   CREATININE 1.44* 1.30* 1.15 1.00 0.86  CALCIUM 8.7* 8.2* 8.2* 8.3* 7.9*  MG  --   --  2.0  --   --    Liver Function Tests:  Recent Labs Lab 08/03/17 0928 08/03/17 1851 08/04/17 0617 08/05/17 0431 08/06/17 0535  AST 42* 39 37 45* 50*  ALT 35 33 29 32 35  ALKPHOS 73 73 65 70 73  BILITOT 1.9* 1.9* 1.9* 2.0* 1.9*  PROT 5.5* 5.5* 4.7* 5.2* 5.4*  ALBUMIN 3.0* 3.0* 2.6* 2.8* 2.7*   No results for input(s): LIPASE, AMYLASE in the last 168 hours.  Recent Labs Lab 08/03/17 0917 08/03/17 1851 08/04/17 0617 08/06/17 0957  AMMONIA 138* 88* 139* 68*   CBC:  Recent Labs Lab 08/03/17 0928 08/04/17 0617 08/05/17 0431 08/06/17 0535 08/07/17 0523  WBC 5.1 4.6 4.8 5.3 4.5  NEUTROABS  --   --  3.2 3.3  --   HGB 12.8* 11.3* 13.4 12.1* 11.6*  HCT 36.6* 33.1* 37.3* 35.9* 34.2*  MCV 90.1 89.9 90.8 91.3 91.2  PLT 69* 73* 69* 81* 78*   Cardiac Enzymes: No results for input(s): CKTOTAL, CKMB, CKMBINDEX, TROPONINI in the last 168 hours. BNP: Invalid input(s): POCBNP CBG: No results for input(s): GLUCAP in the last 168 hours. D-Dimer No results for input(s): DDIMER in the last 72 hours. Hgb A1c No results for input(s): HGBA1C in the last 72 hours. Lipid Profile No results for input(s): CHOL, HDL, LDLCALC, TRIG, CHOLHDL, LDLDIRECT in the last 72 hours. Thyroid function studies No results for input(s): TSH, T4TOTAL, T3FREE, THYROIDAB in the last 72 hours.  Invalid input(s): FREET3 Anemia work up No results for input(s):  VITAMINB12, FOLATE, FERRITIN, TIBC, IRON, RETICCTPCT in the last 72 hours. Urinalysis    Component Value Date/Time   COLORURINE YELLOW 08/03/2017 1048   APPEARANCEUR CLOUDY (A) 08/03/2017 1048   LABSPEC 1.018 08/03/2017 1048   PHURINE 5.0 08/03/2017 1048   GLUCOSEU NEGATIVE 08/03/2017 1048   HGBUR MODERATE (A) 08/03/2017 1048   BILIRUBINUR NEGATIVE 08/03/2017 [place] 08/03/2017 1048   PROTEINUR 100 (A) 08/03/2017 1048   UROBILINOGEN 1.0 08/23/2014 1839   NITRITE NEGATIVE 08/03/2017 1048   LEUKOCYTESUR LARGE (A) 08/03/2017 1048   Sepsis Labs Invalid input(s): PROCALCITONIN,  WBC,  LACTICIDVEN   Time coordinating discharge: 35 minutes  SIGNED:  Marzetta Board, MD  [facility] 08/07/2017, 9:34 AM Pager [phone]  If 7PM-7AM, please contact night-coverage [email or web address] Password [email or web address]

## 2017-08-07 NOTE — Clinical Social Work Note (Signed)
CSW notified by MD that pt is ready for DC. CSW contacted pt's sister, she has selected [facility] for SNF. CSW called Abigail Butts @ [facility], ok for pt to come today, sister to sign paperwork @ 1PM today.  Clinical Social Worker facilitated patient discharge including contacting patient family and facility to confirm patient discharge plans.  Clinical information faxed to facility and family agreeable with plan.  CSW arranged ambulance transport via [facility] to [facility] @ 2PM .  RN to call report to [phone] prior to discharge.  Clinical Social Worker will sign off for now as social work intervention is no longer needed. Please consult [place] again if new need arises.  Wynonna Fitzhenry B. Joline Maxcy Clinical Social Work Dept Weekend Social Worker [phone] 10:27 AM

## 2017-08-07 NOTE — Progress Notes (Signed)
Pt wanted some lip balm. NP on call notified. Will continue to monitor.

## 2017-08-07 NOTE — Clinical Social Work Placement (Signed)
   CLINICAL SOCIAL WORK PLACEMENT  NOTE  Date:  08/07/2017  Patient Details  Name: Joshua [name] MRN: [id number] Date of Birth: [birth year]  Clinical Social Work is seeking post-discharge placement for this patient at the [place] level of care (*CSW will initial, date and re-position this form in  chart as items are completed):  Yes   Patient/family provided with [place] Work Department's list of facilities offering this level of care within the geographic area requested by the patient (or if unable, by the patient's family).  Yes   Patient/family informed of their freedom to choose among providers that offer the needed level of care, that participate in Medicare, Medicaid or managed care program needed by the patient, have an available bed and are willing to accept the patient.  Yes   Patient/family informed of [facility]'s ownership interest in [facility] and [facility], as well as of the fact that they are under no obligation to receive care at these facilities.  PASRR submitted to EDS on 08/04/17     PASRR number received on 08/04/17     Existing PASRR number confirmed on       FL2 transmitted to all facilities in geographic area requested by pt/family on 08/04/17     FL2 transmitted to all facilities within larger geographic area on       Patient informed that his/her managed care company has contracts with or will negotiate with certain facilities, including the following:        Yes   Patient/family informed of bed offers received.  Patient chooses bed at  [facility])     Physician recommends and patient chooses bed at      Patient to be transferred to  ([facility]) on 08/07/17.  Patient to be transferred to facility by  [facility])     Patient family notified on 08/07/17 of transfer.  Name of family member notified:  Sister [facility]     PHYSICIAN       Additional Comment:     _______________________________________________ Serafina Mitchell, [place] 08/07/2017, 10:31 AM

## 2017-08-08 ENCOUNTER — Encounter: Payer: Self-pay | Admitting: *Deleted

## 2017-08-08 ENCOUNTER — Other Ambulatory Visit: Payer: Self-pay | Admitting: *Deleted

## 2017-08-08 DIAGNOSIS — L97228 Non-pressure chronic ulcer of left calf with other specified severity: Secondary | ICD-10-CM | POA: Diagnosis not present

## 2017-08-08 DIAGNOSIS — K7290 Hepatic failure, unspecified without coma: Secondary | ICD-10-CM | POA: Diagnosis not present

## 2017-08-08 DIAGNOSIS — N390 Urinary tract infection, site not specified: Secondary | ICD-10-CM | POA: Diagnosis not present

## 2017-08-08 DIAGNOSIS — B182 Chronic viral hepatitis C: Secondary | ICD-10-CM | POA: Diagnosis not present

## 2017-08-08 NOTE — Patient Outreach (Signed)
[facility]) Care Management  08/08/2017  Joshua [name] [birth year] [id number]   CSW was able to make initial contact with patient's sister, [name] today to perform the initial phone assessment on patient, as well as assess and assist with social work needs and services.  CSW obtained two HIPAA compliant identifiers from [name], which included patient's name and date of birth.  [name] remembers having spoken with CSW on October 3rd, regarding long-term care placement arrangements for patient. At that time, arrangements had been made for patient to be placed in an [place], on October 10th.  However, patient ended up being transported by [facility] to the [facility] Emergency Department on October 10th, due to AMS (Altered Mental Status).  Patient remained inpatient at [facility] until October 14th, where he was placed at [facility] for short-term rehabilitative services. Now that patient is requiring a higher level of care, [name] is hopeful that patient will be able to remain at [facility] for long-term care services. [name] reported that she has an appointment to meet with the social worker at [facility] tomorrow afternoon to discuss options.  CSW will make arrangements to meet with patient, [name] and the social worker tomorrow (Tuesday, October 16th at 2:00pm) to assist with long-term care placement arrangements for patient.  CSW also agreed to assist [name] with completion of a Long-Term Care Medicaid application for patient, for which she denied, indicating that her brother, [name] is currently handling all of patient's financial affairs.  CSW encouraged [name] to have Hand contact CSW directly if he has questions or needs assistance with completion of the application. [name] reported that she plans to remain in [place], [place], as she  currently resides in [place], [place], until patient's affairs are in order, the house is ready to be placed on the market, and patient is securely placed into a long-term care facility.  CSW agreed to assist [name], along with the social worker at [facility], with arranging for long-term care placement for patient.  CSW was able to ensure that [name] has the correct contact information for CSW, encouraging her to contact CSW directly if she has questions or needs assistance with anything placement related.  The goal is for patient to not return home to live, at any point in time, as patient is now requiring 24 hour care and supervision, which cannot be provided for patient in the home.  [name] reported that the [place], which she received while patient was hospitalized, was extremely valuable and the information received will be helpful to her and her siblings moving forward. [name], BSW, MSW, LCSW  Licensed Education officer, environmental Health System  Mailing [place] [address] [place], [place] [address] Physical Address-[address], [place], [place] [address] Toll Free Main # [phone] Fax # [phone] Cell # [phone]  Office # [phone] [email or web address]

## 2017-08-08 NOTE — Patient Outreach (Signed)
Request received from Joanna Saporito, LCSW to mail patient personal care resources.  Information mailed today. 

## 2017-08-08 NOTE — Patient Outreach (Addendum)
[facility]) Care Management  08/08/2017  Joshua [name] 09-Apr-1946 [id number]   Per chart review; patient had discharged from hospital & admitted to [facility] 08/07/2017.  Plan: Will advise [facility] Clinical Social Worker of SN[facility] admission. Telephonic will sign off.   [name], RN BSN [place] Management Coordinator [facility] Care Management  [phone]

## 2017-08-09 ENCOUNTER — Other Ambulatory Visit: Payer: Self-pay | Admitting: *Deleted

## 2017-08-09 NOTE — Patient Outreach (Signed)
[facility]) Care Management  08/09/2017  Joshua [name] 1946-07-26 [id number]   CSW was able to meet with patient today at [facility] where patient currently resides to receive short-term rehabilitative services.  CSW learned from patient that patient's sister, Bralyn Folkert had actually been by the facility earlier in the day to meet with the social worker at the facility.  However, CSW was not made aware of the change in time for the meeting.  Patient was unable to recall the discussion, encouraging CSW to speak directly with [name].  CSW also agreed to meet with the social worker at [place] to discuss discharge planning arrangements for patient. Patient appeared to be unaware that he would not be returning home to live upon discharge from [facility]; therefore, CSW did not broach the subject with him directly.  After several lengthy discussions with Mrs. Koy, CSW learned that she and her brother, Dupree Givler are both in the process of trying to find long-term care skilled nursing placement for patient.  Mrs. Holmer is hopeful that patient will be able to remain at [facility], if patient is agreeable.  Otherwise, Mrs. Utsey reported that she will be looking into other skilled nursing facilities for long-term care services. Patient was pleasant during the visit, but appeared to be somewhat confused.  Patient admitted that he was riding the exercise bike in the workout room and was told that he needed to come back to his room.  Patient reported working well with therapies (both physical and occupational).  Patient inquired as to whether or not CSW knew of a tentative discharge date for him.  CSW explained that skilled nursing facility placement, for rehabilitative services, is typically a 20 day stay, as long as Medicare continues to approve therapies.  Patient voiced understanding and was agreeable to this plan.   CSW agreed to follow-up with patient again next week for a routine visit. [name], BSW, MSW, LCSW  Licensed Education officer, environmental Health System  Mailing [place] [address] [place], [place] [address] Physical Address-[address], [place], [place] [address] Toll Free Main # [phone] Fax # [phone] Cell # [phone]  Office # [phone] [email or web address]

## 2017-08-10 DIAGNOSIS — K7460 Unspecified cirrhosis of liver: Secondary | ICD-10-CM | POA: Diagnosis not present

## 2017-08-10 DIAGNOSIS — B1920 Unspecified viral hepatitis C without hepatic coma: Secondary | ICD-10-CM | POA: Diagnosis not present

## 2017-08-10 DIAGNOSIS — I10 Essential (primary) hypertension: Secondary | ICD-10-CM | POA: Diagnosis not present

## 2017-08-10 DIAGNOSIS — D649 Anemia, unspecified: Secondary | ICD-10-CM | POA: Diagnosis not present

## 2017-08-10 DIAGNOSIS — G9340 Encephalopathy, unspecified: Secondary | ICD-10-CM | POA: Diagnosis not present

## 2017-08-11 DIAGNOSIS — R531 Weakness: Secondary | ICD-10-CM | POA: Diagnosis not present

## 2017-08-15 DIAGNOSIS — K7290 Hepatic failure, unspecified without coma: Secondary | ICD-10-CM | POA: Diagnosis not present

## 2017-08-15 DIAGNOSIS — K219 Gastro-esophageal reflux disease without esophagitis: Secondary | ICD-10-CM | POA: Diagnosis not present

## 2017-08-15 DIAGNOSIS — N390 Urinary tract infection, site not specified: Secondary | ICD-10-CM | POA: Diagnosis not present

## 2017-08-15 DIAGNOSIS — E871 Hypo-osmolality and hyponatremia: Secondary | ICD-10-CM | POA: Diagnosis not present

## 2017-08-16 DIAGNOSIS — R531 Weakness: Secondary | ICD-10-CM | POA: Diagnosis not present

## 2017-08-17 ENCOUNTER — Other Ambulatory Visit: Payer: Self-pay | Admitting: *Deleted

## 2017-08-17 DIAGNOSIS — S81801A Unspecified open wound, right lower leg, initial encounter: Secondary | ICD-10-CM | POA: Diagnosis not present

## 2017-08-17 DIAGNOSIS — S81802A Unspecified open wound, left lower leg, initial encounter: Secondary | ICD-10-CM | POA: Diagnosis not present

## 2017-08-17 NOTE — Patient Outreach (Signed)
[facility]) Care Management  08/17/2017  Joshua [name] 05/24/1946 [id number]   CSW was able to meet with patient at [facility] [place] where patient currently resides to receive short-term rehabilitative services, today to perform a routine visit.  Patient appears to be getting along well at [facility], reporting that he may be staying there permanently, "depending upon how the meeting goes tomorrow".  CSW is aware that patient's sister, Pao Haffey and brother, Dontrail Blackwell are meeting with the social worker at [facility] at noon tomorrow (Thursday, October 25th) to discuss patient's discharge planning arrangements.  Patient is aware that he will not be able to return home to live independently, due to the severity of his confusion and memory deficits. Mrs. Goodlin reported that she would like for patient to go to an [facility] with a memory care unit and wander-guard system, such as [facility], arranging for a representative to come and meet with patient at [facility] to determine appropriateness at their facility.  However, [name] Dente believes that patient should remain at , requiring the higher level of care and 24 hour supervision.  These are things that patient, Ms. Darrick Grinder and [employer] plan to discuss with the Education officer, museum tomorrow during the meeting.  CSW is aware that patient can be discharged from [facility] as early as next week, wanting to ensure that discharge planning arrangements are in place before insurance denies coverage.  Patient reports that he is agreeable to "whatever decisions are made" on his behalf.  Ms. Senske agreed to contact CSW on Friday, October 26th to report findings.  CSW will plan to meet with patient at [facility] on Thursday, November 1st for a routine visit to finalize discharge planning arrangements. [name], BSW, MSW, LCSW  Licensed Regulatory affairs officer Health System  Mailing [place] [address] [place], [place] [address] Physical Address-[address], [place], [place] [address] Toll Free Main # [phone] Fax # [phone] Cell # [phone]  Office # [phone] [email or web address]

## 2017-08-22 ENCOUNTER — Other Ambulatory Visit: Payer: Self-pay | Admitting: *Deleted

## 2017-08-22 ENCOUNTER — Encounter: Payer: Self-pay | Admitting: *Deleted

## 2017-08-22 DIAGNOSIS — K7469 Other cirrhosis of liver: Secondary | ICD-10-CM | POA: Diagnosis not present

## 2017-08-22 DIAGNOSIS — K7290 Hepatic failure, unspecified without coma: Secondary | ICD-10-CM | POA: Diagnosis not present

## 2017-08-22 DIAGNOSIS — L97211 Non-pressure chronic ulcer of right calf limited to breakdown of skin: Secondary | ICD-10-CM | POA: Diagnosis not present

## 2017-08-22 DIAGNOSIS — L97228 Non-pressure chronic ulcer of left calf with other specified severity: Secondary | ICD-10-CM | POA: Diagnosis not present

## 2017-08-22 NOTE — Patient Outreach (Signed)
[facility]) Care Management  08/22/2017  Joshua [name] May 17, 1946 [id number]   CSW received an In Basket message in [place] from Burgess Amor, [place] Acute Discharge Planning Coordinator with [facility], indicating that patient will remain at [facility] for long-term care skilled nursing placement.  [name] went on to say that it was decided during the Family Meeting on Friday, October 26th that patient would remain at [facility], per patient and family's request.  CSW will perform a case closure on patient, as all goals of treatment have been met from social work standpoint and no additional social work needs have been identified at this time.  CSW will fax an update to patient's Primary Care Physician, [name] to ensure that they are aware of CSW's involvement with patient's plan of care.  CSW will submit a case closure request to Alycia Rossetti, Care Management Assistant with [facility], in the form of an In [employer].   [name], BSW, MSW, LCSW  Licensed Education officer, environmental Health System  Mailing [place] [address] [place], [place] [address] Physical Address-[address], [place], [place] [address] Toll Free Main # [phone] Fax # [phone] Cell # [phone]  Office # [phone] [email or web address]

## 2017-08-24 DIAGNOSIS — S81801D Unspecified open wound, right lower leg, subsequent encounter: Secondary | ICD-10-CM | POA: Diagnosis not present

## 2017-08-24 DIAGNOSIS — S81802D Unspecified open wound, left lower leg, subsequent encounter: Secondary | ICD-10-CM | POA: Diagnosis not present

## 2017-08-25 ENCOUNTER — Ambulatory Visit: Payer: Self-pay | Admitting: *Deleted

## 2017-08-25 DIAGNOSIS — L97228 Non-pressure chronic ulcer of left calf with other specified severity: Secondary | ICD-10-CM | POA: Diagnosis not present

## 2017-08-25 DIAGNOSIS — R531 Weakness: Secondary | ICD-10-CM | POA: Diagnosis not present

## 2017-08-25 DIAGNOSIS — R600 Localized edema: Secondary | ICD-10-CM | POA: Diagnosis not present

## 2017-08-25 DIAGNOSIS — L97211 Non-pressure chronic ulcer of right calf limited to breakdown of skin: Secondary | ICD-10-CM | POA: Diagnosis not present

## 2017-08-25 DIAGNOSIS — K7290 Hepatic failure, unspecified without coma: Secondary | ICD-10-CM | POA: Diagnosis not present

## 2017-08-29 DIAGNOSIS — R600 Localized edema: Secondary | ICD-10-CM | POA: Diagnosis not present

## 2017-08-29 DIAGNOSIS — L97228 Non-pressure chronic ulcer of left calf with other specified severity: Secondary | ICD-10-CM | POA: Diagnosis not present

## 2017-08-29 DIAGNOSIS — K7290 Hepatic failure, unspecified without coma: Secondary | ICD-10-CM | POA: Diagnosis not present

## 2017-08-29 DIAGNOSIS — L97211 Non-pressure chronic ulcer of right calf limited to breakdown of skin: Secondary | ICD-10-CM | POA: Diagnosis not present

## 2017-08-31 ENCOUNTER — Encounter (HOSPITAL_BASED_OUTPATIENT_CLINIC_OR_DEPARTMENT_OTHER): Payer: Medicare Other

## 2017-08-31 DIAGNOSIS — S81802D Unspecified open wound, left lower leg, subsequent encounter: Secondary | ICD-10-CM | POA: Diagnosis not present

## 2017-08-31 DIAGNOSIS — K7290 Hepatic failure, unspecified without coma: Secondary | ICD-10-CM | POA: Diagnosis not present

## 2017-08-31 DIAGNOSIS — D649 Anemia, unspecified: Secondary | ICD-10-CM | POA: Diagnosis not present

## 2017-08-31 DIAGNOSIS — R600 Localized edema: Secondary | ICD-10-CM | POA: Diagnosis not present

## 2017-08-31 DIAGNOSIS — S81801D Unspecified open wound, right lower leg, subsequent encounter: Secondary | ICD-10-CM | POA: Diagnosis not present

## 2017-08-31 DIAGNOSIS — K219 Gastro-esophageal reflux disease without esophagitis: Secondary | ICD-10-CM | POA: Diagnosis not present

## 2017-09-02 DIAGNOSIS — K7290 Hepatic failure, unspecified without coma: Secondary | ICD-10-CM | POA: Diagnosis not present

## 2017-09-02 DIAGNOSIS — R600 Localized edema: Secondary | ICD-10-CM | POA: Diagnosis not present

## 2017-09-02 DIAGNOSIS — W19XXXD Unspecified fall, subsequent encounter: Secondary | ICD-10-CM | POA: Diagnosis not present

## 2017-09-02 DIAGNOSIS — K7469 Other cirrhosis of liver: Secondary | ICD-10-CM | POA: Diagnosis not present

## 2017-09-07 DIAGNOSIS — S81801D Unspecified open wound, right lower leg, subsequent encounter: Secondary | ICD-10-CM | POA: Diagnosis not present

## 2017-09-07 DIAGNOSIS — S81802D Unspecified open wound, left lower leg, subsequent encounter: Secondary | ICD-10-CM | POA: Diagnosis not present

## 2017-09-10 DIAGNOSIS — R600 Localized edema: Secondary | ICD-10-CM | POA: Diagnosis not present

## 2017-09-10 DIAGNOSIS — L97211 Non-pressure chronic ulcer of right calf limited to breakdown of skin: Secondary | ICD-10-CM | POA: Diagnosis not present

## 2017-09-10 DIAGNOSIS — K7290 Hepatic failure, unspecified without coma: Secondary | ICD-10-CM | POA: Diagnosis not present

## 2017-09-10 DIAGNOSIS — W19XXXD Unspecified fall, subsequent encounter: Secondary | ICD-10-CM | POA: Diagnosis not present

## 2017-09-12 ENCOUNTER — Observation Stay (HOSPITAL_COMMUNITY)
Admission: EM | Admit: 2017-09-12 | Discharge: 2017-09-13 | Disposition: A | Discharge status: Skilled Nursing Facility | Payer: Medicare Other | Attending: Internal Medicine | Admitting: Internal Medicine

## 2017-09-12 ENCOUNTER — Encounter (HOSPITAL_COMMUNITY): Payer: Self-pay | Admitting: Emergency Medicine

## 2017-09-12 DIAGNOSIS — K7460 Unspecified cirrhosis of liver: Secondary | ICD-10-CM | POA: Diagnosis not present

## 2017-09-12 DIAGNOSIS — B1920 Unspecified viral hepatitis C without hepatic coma: Secondary | ICD-10-CM | POA: Diagnosis not present

## 2017-09-12 DIAGNOSIS — N179 Acute kidney failure, unspecified: Secondary | ICD-10-CM | POA: Insufficient documentation

## 2017-09-12 DIAGNOSIS — K7290 Hepatic failure, unspecified without coma: Secondary | ICD-10-CM | POA: Diagnosis not present

## 2017-09-12 DIAGNOSIS — E876 Hypokalemia: Secondary | ICD-10-CM

## 2017-09-12 DIAGNOSIS — Z79899 Other long term (current) drug therapy: Secondary | ICD-10-CM | POA: Insufficient documentation

## 2017-09-12 DIAGNOSIS — E860 Dehydration: Secondary | ICD-10-CM | POA: Insufficient documentation

## 2017-09-12 DIAGNOSIS — R5381 Other malaise: Secondary | ICD-10-CM | POA: Insufficient documentation

## 2017-09-12 DIAGNOSIS — E7220 Disorder of urea cycle metabolism, unspecified: Secondary | ICD-10-CM

## 2017-09-12 DIAGNOSIS — F988 Other specified behavioral and emotional disorders with onset usually occurring in childhood and adolescence: Secondary | ICD-10-CM | POA: Insufficient documentation

## 2017-09-12 DIAGNOSIS — Z885 Allergy status to narcotic agent status: Secondary | ICD-10-CM | POA: Insufficient documentation

## 2017-09-12 DIAGNOSIS — Z7189 Other specified counseling: Secondary | ICD-10-CM

## 2017-09-12 DIAGNOSIS — K219 Gastro-esophageal reflux disease without esophagitis: Secondary | ICD-10-CM | POA: Diagnosis not present

## 2017-09-12 DIAGNOSIS — I10 Essential (primary) hypertension: Secondary | ICD-10-CM | POA: Diagnosis not present

## 2017-09-12 DIAGNOSIS — K7200 Acute and subacute hepatic failure without coma: Secondary | ICD-10-CM | POA: Diagnosis not present

## 2017-09-12 DIAGNOSIS — F1021 Alcohol dependence, in remission: Secondary | ICD-10-CM | POA: Diagnosis not present

## 2017-09-12 DIAGNOSIS — D696 Thrombocytopenia, unspecified: Secondary | ICD-10-CM | POA: Insufficient documentation

## 2017-09-12 DIAGNOSIS — F28 Other psychotic disorder not due to a substance or known physiological condition: Secondary | ICD-10-CM | POA: Diagnosis not present

## 2017-09-12 DIAGNOSIS — K7469 Other cirrhosis of liver: Secondary | ICD-10-CM | POA: Diagnosis not present

## 2017-09-12 DIAGNOSIS — R4182 Altered mental status, unspecified: Secondary | ICD-10-CM

## 2017-09-12 DIAGNOSIS — F29 Unspecified psychosis not due to a substance or known physiological condition: Secondary | ICD-10-CM | POA: Diagnosis not present

## 2017-09-12 DIAGNOSIS — K7682 Hepatic encephalopathy: Secondary | ICD-10-CM | POA: Diagnosis present

## 2017-09-12 DIAGNOSIS — R600 Localized edema: Secondary | ICD-10-CM | POA: Diagnosis not present

## 2017-09-12 DIAGNOSIS — K5900 Constipation, unspecified: Secondary | ICD-10-CM | POA: Diagnosis not present

## 2017-09-12 LAB — COMPREHENSIVE METABOLIC PANEL
ALBUMIN: 3 g/dL — AB (ref 3.5–5.0)
ALT: 26 U/L (ref 17–63)
AST: 39 U/L (ref 15–41)
Alkaline Phosphatase: 97 U/L (ref 38–126)
Anion gap: 11 (ref 5–15)
BUN: 15 mg/dL (ref 6–20)
CHLORIDE: 94 mmol/L — AB (ref 101–111)
CO2: 27 mmol/L (ref 22–32)
Calcium: 8.5 mg/dL — ABNORMAL LOW (ref 8.9–10.3)
Creatinine, Ser: 1.27 mg/dL — ABNORMAL HIGH (ref 0.61–1.24)
GFR calc Af Amer: >60 mL/min (ref >60)
GFR calc non Af Amer: 54 mL/min — ABNORMAL LOW (ref >60)
GLUCOSE: 128 mg/dL — AB (ref 65–99)
POTASSIUM: 2.8 mmol/L — AB (ref 3.5–5.1)
SODIUM: 132 mmol/L — AB (ref 135–145)
Total Bilirubin: 3.2 mg/dL — ABNORMAL HIGH (ref 0.3–1.2)
Total Protein: 6 g/dL — ABNORMAL LOW (ref 6.5–8.1)

## 2017-09-12 LAB — CBC WITH DIFFERENTIAL/PLATELET
BASOS ABS: 0 10*3/uL (ref 0.0–0.1)
BASOS PCT: 1 %
EOS ABS: 0.1 10*3/uL (ref 0.0–0.7)
EOS PCT: 2 %
HCT: 34.4 % — ABNORMAL LOW (ref 39.0–52.0)
Hemoglobin: 11.6 g/dL — ABNORMAL LOW (ref 13.0–17.0)
Lymphocytes Relative: 22 %
Lymphs Abs: 1.1 10*3/uL (ref 0.7–4.0)
MCH: 29.9 pg (ref 26.0–34.0)
MCHC: 33.7 g/dL (ref 30.0–36.0)
MCV: 88.7 fL (ref 78.0–100.0)
MONO ABS: 0.5 10*3/uL (ref 0.1–1.0)
MONOS PCT: 10 %
Neutro Abs: 3.2 10*3/uL (ref 1.7–7.7)
Neutrophils Relative %: 65 %
PLATELETS: 73 10*3/uL — AB (ref 150–400)
RBC: 3.88 MIL/uL — AB (ref 4.22–5.81)
RDW: 15.5 % (ref 11.5–15.5)
WBC: 4.9 10*3/uL (ref 4.0–10.5)

## 2017-09-12 LAB — URINALYSIS, ROUTINE W REFLEX MICROSCOPIC
Bilirubin Urine: NEGATIVE
Glucose, UA: NEGATIVE mg/dL
HGB URINE DIPSTICK: NEGATIVE
Ketones, ur: NEGATIVE mg/dL
Leukocytes, UA: NEGATIVE
Nitrite: NEGATIVE
PROTEIN: NEGATIVE mg/dL
Specific Gravity, Urine: 1.006 (ref 1.005–1.030)
pH: 7 (ref 5.0–8.0)

## 2017-09-12 LAB — MAGNESIUM: MAGNESIUM: 2 mg/dL (ref 1.7–2.4)

## 2017-09-12 LAB — AMMONIA
AMMONIA: 101 umol/L — AB (ref 9–35)
Ammonia: 85 umol/L — ABNORMAL HIGH (ref 9–35)

## 2017-09-12 LAB — PROTIME-INR
INR: 1.4
PROTHROMBIN TIME: 17 s — AB (ref 11.4–15.2)

## 2017-09-12 MED ORDER — DOXYCYCLINE HYCLATE 100 MG PO CAPS
100.0000 mg | ORAL_CAPSULE | Freq: Two times a day (BID) | ORAL | Status: DC
Start: 2017-09-12 — End: 2017-09-12

## 2017-09-12 MED ORDER — LACTULOSE 10 GM/15ML PO SOLN
30.0000 g | Freq: Three times a day (TID) | ORAL | Status: DC
Start: 2017-09-12 — End: 2017-09-12

## 2017-09-12 MED ORDER — SODIUM FLUORIDE 0.2 % MT SOLN: 1.0000 mL | OROMUCOSAL | Status: DC

## 2017-09-12 MED ORDER — LACTULOSE ENEMA: 300.0000 mL | Freq: Once | ORAL | Status: DC

## 2017-09-12 MED ORDER — ONDANSETRON HCL 4 MG PO TABS: 4.0000 mg | ORAL_TABLET | Freq: Four times a day (QID) | ORAL | Status: DC | PRN

## 2017-09-12 MED ORDER — LACTULOSE 10 GM/15ML PO SOLN: 30.0000 g | Freq: Four times a day (QID) | ORAL | Status: DC

## 2017-09-12 MED ORDER — LACTULOSE 10 GM/15ML PO SOLN
30.0000 g | Freq: Once | ORAL | Status: AC
  Administered 2017-09-12: 30 g via ORAL
  Filled 2017-09-12: qty 45

## 2017-09-12 MED ORDER — PANTOPRAZOLE SODIUM 40 MG PO TBEC
40.0000 mg | DELAYED_RELEASE_TABLET | Freq: Every day | ORAL | Status: DC
  Administered 2017-09-13: 40 mg via ORAL
  Filled 2017-09-12: qty 1

## 2017-09-12 MED ORDER — RIFAXIMIN 200 MG PO TABS
400.0000 mg | ORAL_TABLET | Freq: Three times a day (TID) | ORAL | Status: DC
  Administered 2017-09-12 – 2017-09-13 (×4): 400 mg via ORAL
  Filled 2017-09-12 (×4): qty 2

## 2017-09-12 MED ORDER — SODIUM CHLORIDE 0.9 % IV SOLN
INTRAVENOUS | Status: AC
  Administered 2017-09-12: 22:00:00 via INTRAVENOUS

## 2017-09-12 MED ORDER — SPIRONOLACTONE 100 MG PO TABS
100.0000 mg | ORAL_TABLET | Freq: Every day | ORAL | Status: DC
  Administered 2017-09-13: 100 mg via ORAL
  Filled 2017-09-12: qty 1

## 2017-09-12 MED ORDER — AMPHETAMINE-DEXTROAMPHETAMINE 10 MG PO TABS
20.0000 mg | ORAL_TABLET | Freq: Every day | ORAL | Status: DC
  Administered 2017-09-13: 20 mg via ORAL
  Filled 2017-09-12: qty 2

## 2017-09-12 MED ORDER — LACTULOSE 10 GM/15ML PO SOLN
30.0000 g | Freq: Four times a day (QID) | ORAL | Status: DC
  Administered 2017-09-13 (×2): 30 g via ORAL
  Filled 2017-09-12 (×2): qty 45

## 2017-09-12 MED ORDER — ARTIFICIAL TEARS OPHTHALMIC OINT
1.0000 "application " | TOPICAL_OINTMENT | Freq: Every day | OPHTHALMIC | Status: DC
  Administered 2017-09-12: 1 via OPHTHALMIC
  Filled 2017-09-12: qty 3.5

## 2017-09-12 MED ORDER — ONDANSETRON HCL 4 MG/2ML IJ SOLN: 4.0000 mg | Freq: Four times a day (QID) | INTRAMUSCULAR | Status: DC | PRN

## 2017-09-12 MED ORDER — SPIRONOLACTONE 100 MG PO TABS: 100.0000 mg | ORAL_TABLET | Freq: Every day | ORAL | Status: DC

## 2017-09-12 MED ORDER — LACTULOSE ENEMA
300.0000 mL | Freq: Once | ORAL | Status: AC
  Administered 2017-09-12: 300 mL via RECTAL
  Filled 2017-09-12: qty 300

## 2017-09-12 MED ORDER — POTASSIUM CHLORIDE CRYS ER 20 MEQ PO TBCR
40.0000 meq | EXTENDED_RELEASE_TABLET | ORAL | Status: AC
  Administered 2017-09-12 (×2): 40 meq via ORAL
  Filled 2017-09-12 (×2): qty 2

## 2017-09-12 MED ORDER — BISACODYL 5 MG PO TBEC: 5.0000 mg | DELAYED_RELEASE_TABLET | Freq: Every day | ORAL | Status: DC | PRN

## 2017-09-12 NOTE — ED Notes (Signed)
Attempted report x1. 

## 2017-09-12 NOTE — Progress Notes (Signed)
Patient arrived to 6N21 A&Ox3, VSS, IV intact and saline locked.  Noted to abrasions to R lower leg, L hand, and R ring finger.  Wound present on L lower leg that was previously dressed at the care facility patient came from.  Abrasions cleaned and dressed, and wound packed with wet guaze and dressed with guaze and abd pad.  Denies any pain presently.    Patient oriented to room and equipment.  Will continue to monitor.

## 2017-09-12 NOTE — H&P (Signed)
History and Physical    Joshua [name]:[id number] DOB: Oct 18, 1946 DOA: 09/12/2017  PCP: [name], MD Patient coming from: facility  Chief Complaint: ams  HPI: Joshua [name] is a 71 y.o. male with medical history significant for cirrhosis, anemia, hepatitis C, hypertension since to the emergency Department chief complaint altered mental status. Initial evaluation reveals elevated amonia levels.   Information is obtained from the patient and the sister who is at the bedside noting that information from patient is unreliable do to encephalopathy. This is the third admission in 3 months for this patient for similar symptoms. He was recently discharged to home and possible rehabilitation. sister reports he improved and was transferred to assisted living part of that facility and while he has assistance with his medications and is compliant sister reports no bm recently. Sister reports he showed signs of gradual worsening confusion over the last 2-3 days. She states she encouraged staff at the facility to treat constipation. In addition she states during his last hospitalization he was heavily diuresed and was discharged on Lasix and she feels now he is "out of balance". She states his weight now is much lower than it was when he was discharged. Patient denies any pain. He denies headache dizziness syncope or near-syncope. He denies chest pain shortness of breath abdominal pain nausea vomiting. He denies fever chills cough.    ED Course: In the emergency department is afebrile hemodynamically stable and not hypoxic.  Review of Systems: As per HPI otherwise all other systems reviewed and are negative.   Ambulatory Status: Ambulates independently. Currently a resident of assisted living.  Past Medical History:  Diagnosis Date  . Anemia    low platelets/liver disease  . Arthritis   . Cirrhosis ([place])   . Hepatitis C   . Hypertension   . Wears glasses   . Wears partial dentures     top partial    Past Surgical History:  Procedure Laterality Date  . APPENDECTOMY    . COLONOSCOPY    . EYE SURGERY     both cataracts  . LEFT SHOULDER ARTHROSCOPY WITH SUBACROMIAL DECOMPRESSION, DISTAL CLAVICULECTOMY Left 08/09/2013   Performed by Ninetta Lights, MD at [facility]  . PILONIDAL CYST EXCISION  1960  . TONSILLECTOMY    . UPPER GI ENDOSCOPY      Social History   Socioeconomic History  . Marital status: Single    Spouse name: Not on file  . Number of children: Not on file  . Years of education: Not on file  . Highest education level: Not on file  Social Needs  . Financial resource strain: Not on file  . Food insecurity - worry: Not on file  . Food insecurity - inability: Not on file  . Transportation needs - medical: Not on file  . Transportation needs - non-medical: Not on file  Occupational History  . Not on file  Tobacco Use  . Smoking status: Light Tobacco Smoker  . Smokeless tobacco: Current User  Substance and Sexual Activity  . Alcohol use: Yes    Comment: "one beer a week"  . Drug use: Yes    Types: Marijuana  . Sexual activity: Yes    Comment: smoking every few days-to weeks  Other Topics Concern  . Not on file  Social History Narrative  . Not on file    Allergies  Allergen Reactions  . Codeine Itching    Family History  Problem Relation Age of  Onset  . Cirrhosis Mother   . Cirrhosis Father   . CAD Neg Hx     Prior to Admission medications   Medication Sig Start Date End Date Taking? Authorizing Provider  amphetamine-dextroamphetamine (ADDERALL) 10 MG tablet Take 20 mg by mouth daily.  06/10/12   [provider]  bisacodyl (DULCOLAX) 5 MG EC tablet Take 5 mg by mouth daily as needed for moderate constipation.    [provider]  Docusate Calcium (STOOL SOFTENER PO) Take 1 tablet by mouth daily as needed (constipation).     [provider]  doxycycline (VIBRAMYCIN) 100 MG capsule Take 1  capsule (100 mg total) by mouth 2 (two) times daily. 08/07/17   Caren Griffins, MD  furosemide (LASIX) 40 MG tablet Take 1 tablet (40 mg total) by mouth daily. 08/07/17   Caren Griffins, MD  lactulose (CHRONULAC) 10 GM/15ML solution Take 30 g by mouth 3 (three) times daily. 05/24/14   Caren Griffins, MD  Multiple Vitamins-Minerals (MULTIVITAMIN WITH MINERALS) tablet Take 1 tablet by mouth daily.    [provider]  pantoprazole (PROTONIX) 40 MG tablet Take 40 mg by mouth daily. 06/28/17   [provider]  Polyethyl Glycol-Propyl Glycol (SYSTANE OP) Place 1 drop into both eyes at bedtime.    [provider]  rifaximin (XIFAXAN) 200 MG tablet Take 2 tablets (400 mg total) by mouth 3 (three) times daily. 08/07/17   Gherghe, Vella Redhead, MD  SODIUM FLUORIDE, DENTAL RINSE, (PREVIDENT) 0.2 % SOLN Place 1 mL onto teeth See admin instructions. Rinse with small amount for one minute and then spit; do not eat, drink or rinse for 30 minutes after using     [provider]  spironolactone (ALDACTONE) 100 MG tablet Take 1 tablet (100 mg total) by mouth daily. 08/07/17   Caren Griffins, MD    Physical Exam: Vitals:   09/12/17 1252  BP: (!) 138/56  Pulse: 91  Resp: 16  Temp: 97.8 F (36.6 C)  TempSrc: Oral  SpO2: 100%     General:  Appears calm and comfortable in no acute distress Eyes:  PERRL, EOMI, normal lids, iris ENT:  grossly normal hearing, lips & tongue, his membranes of his mouth are pink slightly dry Neck:  no LAD, masses or thyromegaly Cardiovascular:  RRR, no m/r/g. 3+ LE edema.  Respiratory:  CTA bilaterally, no w/r/r. Normal respiratory effort. Abdomen:  soft, ntnd, positive bowel sounds but somewhat sluggish no guarding or rebounding Skin:  Bilateral lower extremities with chronic venous stasis dermatitis changes. Healing wounds bilaterally. No drainage or odor Musculoskeletal:  grossly normal tone BUE/BLE, good ROM, no bony  abnormality Psychiatric:  grossly normal mood and affect, speech fluent and appropriate,  Neurologic:  Alert and oriented to self and place. Attempts to follow commands. Cooperative  Labs on Admission: I have personally reviewed following labs and imaging studies  CBC: Recent Labs  Lab 09/12/17 1304  WBC 4.9  NEUTROABS 3.2  HGB 11.6*  HCT 34.4*  MCV 88.7  PLT 73*   Basic Metabolic Panel: Recent Labs  Lab 09/12/17 1304  NA 132*  K 2.8*  CL 94*  CO2 27  GLUCOSE 128*  BUN 15  CREATININE 1.27*  CALCIUM 8.5*   GFR: CrCl cannot be calculated (Unknown ideal weight.). Liver Function Tests: Recent Labs  Lab 09/12/17 1304  AST 39  ALT 26  ALKPHOS 97  BILITOT 3.2*  PROT 6.0*  ALBUMIN 3.0*   No results for  input(s): LIPASE, AMYLASE in the last 168 hours. Recent Labs  Lab 09/12/17 1252  AMMONIA 101*   Coagulation Profile: No results for input(s): INR, PROTIME in the last 168 hours. Cardiac Enzymes: No results for input(s): CKTOTAL, CKMB, CKMBINDEX, TROPONINI in the last 168 hours. BNP (last 3 results) No results for input(s): PROBNP in the last 8760 hours. HbA1C: No results for input(s): HGBA1C in the last 72 hours. CBG: No results for input(s): GLUCAP in the last 168 hours. Lipid Profile: No results for input(s): CHOL, HDL, LDLCALC, TRIG, CHOLHDL, LDLDIRECT in the last 72 hours. Thyroid Function Tests: No results for input(s): TSH, T4TOTAL, FREET4, T3FREE, THYROIDAB in the last 72 hours. Anemia Panel: No results for input(s): VITAMINB12, FOLATE, FERRITIN, TIBC, IRON, RETICCTPCT in the last 72 hours. Urine analysis:    Component Value Date/Time   COLORURINE YELLOW 09/12/2017 1436   APPEARANCEUR CLEAR 09/12/2017 1436   LABSPEC 1.006 09/12/2017 1436   PHURINE 7.0 09/12/2017 1436   GLUCOSEU NEGATIVE 09/12/2017 1436   HGBUR NEGATIVE 09/12/2017 1436   BILIRUBINUR NEGATIVE 09/12/2017 1436   KETONESUR NEGATIVE 09/12/2017 1436   PROTEINUR NEGATIVE 09/12/2017  1436   UROBILINOGEN 1.0 08/23/2014 1839   NITRITE NEGATIVE 09/12/2017 1436   LEUKOCYTESUR NEGATIVE 09/12/2017 1436    Creatinine Clearance: CrCl cannot be calculated (Unknown ideal weight.).  Sepsis Labs: @LABRCNTIP (procalcitonin:4,lacticidven:4) )No results found for this or any previous visit (from the past 240 hour(s)).   Radiological Exams on Admission: No results found.  EKG:   Assessment/Plan Principal Problem:   Acute hepatic encephalopathy Active Problems:   Cirrhosis (HCC)   Hepatitis C   HTN (hypertension)   Hyperammonemia (HCC)   AKI (acute kidney injury) (HCC)   GERD (gastroesophageal reflux disease)   Goals of care, counseling/discussion   #1. Acute hepatic encephalopathy in patient with history of cirrhosis. Ammonia level 101. Sister reports he is compliant with medications but has not had a BM in over 2 days. This will be his third admission in as many months. Sr. does [name] patient improved to the point he qualified for assisted living at facility he was discharged to last hospitalization. No signs infection or acute intracranial process or seizure -Lactulose enema -Increase lactulose 30 g to 4 times a day -Recheck ammonia -continue rifaxamin  #2. Hypokalemia. potassium level 2.9. May be from over [place]. Home medications include Lasix and spironolactone. -Hold Lasix -Gentle IV fluids -Replete -resume spironolactone tomorrow -recheck in am  #3. Hypertension. Controlled in the emergency department. 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 Last MELD score 25 last month per chart review. Total bilirubin 3.2. -Holding Lasix for now -Continue spironolactone  #5. Acute kidney injury. Likely related to poor oral intake in the setting of nephrotoxins. -Holding Lasix for now -Gentle IV fluids -Monitor urine output -bmet in am  #6. GERD stable  at baseline -Continue PPI  #7. ADD -continue adderall     DVT prophylaxis: scd  Code Status:  dnr Family Communication: sister at bedside  Disposition Plan: back to facility (sister prefers different facility)  Consults called: none  Admission status: inpatient    Radene Gunning MD [facility]  If 7PM-7AM, please contact night-coverage [email or web address] Password [email or web address]  09/12/2017, 3:31 PM

## 2017-09-12 NOTE — ED Triage Notes (Signed)
PT having increased confusion (not alert to year) over past week. Family states this is due to increased ammonia. Resides at [facility]. Edematous legs. Takes lactulose 3 times daily.

## 2017-09-12 NOTE — ED Provider Notes (Signed)
[facility] EMERGENCY DEPARTMENT Provider Note   CSN: [id number] Arrival date & time: 09/12/17  1247     History   Chief Complaint Chief Complaint  Patient presents with  . Altered Mental Status    HPI Joshua [name] is a 71 y.o. male.  HPI  A LEVEL 5 CAVEAT PERTAINS DUE TO ALTERED MENTAL STATUS   Pt presenting with increased confusion.  Hx obtained from his sister.  He has hx of Hep C and liver failure- takes lactulose 3 times daily.  Had BM last night- unsure about this morning.  No fever, no abdominal pain.    Past Medical History:  Diagnosis Date  . Anemia    low platelets/liver disease  . Arthritis   . Cirrhosis ([place])   . Hepatitis C   . Hypertension   . Wears glasses   . Wears partial dentures    top partial    Patient Active Problem List   Diagnosis Date Noted  . Goals of care, counseling/discussion   . Palliative care by specialist   . Altered mental status 08/03/2017  . Nonhealing nonsurgical wound 08/03/2017  . AKI (acute kidney injury) ([place]) 08/03/2017  . GERD (gastroesophageal reflux disease) 08/03/2017  . Acute hepatic encephalopathy 07/18/2017  . Hyponatremia 07/18/2017  . Pulmonary nodules 07/18/2017  . Hepatic encephalopathy ([place]) 07/18/2017  . Hyperammonemia ([place]) 05/22/2014  . Acute encephalopathy 05/22/2014  . Renal failure (ARF), acute on chronic ([place]) 05/22/2014  . Cellulitis 02/17/2014  . Cirrhosis ([place]) 02/17/2014  . Hepatitis C 02/17/2014  . HTN (hypertension) 02/17/2014    Past Surgical History:  Procedure Laterality Date  . APPENDECTOMY    . COLONOSCOPY    . EYE SURGERY     both cataracts  . LEFT SHOULDER ARTHROSCOPY WITH SUBACROMIAL DECOMPRESSION, DISTAL CLAVICULECTOMY Left 08/09/2013   Performed by Ninetta Lights, MD at [facility]  . PILONIDAL CYST EXCISION  1960  . TONSILLECTOMY    . UPPER GI ENDOSCOPY         Home Medications    Prior to Admission medications   Medication Sig  Start Date End Date Taking? Authorizing Provider  amphetamine-dextroamphetamine (ADDERALL) 10 MG tablet Take 20 mg by mouth daily.  06/10/12   [provider]  bisacodyl (DULCOLAX) 5 MG EC tablet Take 5 mg by mouth daily as needed for moderate constipation.    [provider]  Docusate Calcium (STOOL SOFTENER PO) Take 1 tablet by mouth daily as needed (constipation).     [provider]  doxycycline (VIBRAMYCIN) 100 MG capsule Take 1 capsule (100 mg total) by mouth 2 (two) times daily. 08/07/17   Caren Griffins, MD  furosemide (LASIX) 40 MG tablet Take 1 tablet (40 mg total) by mouth daily. 08/07/17   Caren Griffins, MD  lactulose (CHRONULAC) 10 GM/15ML solution Take 30 g by mouth 3 (three) times daily. 05/24/14   Caren Griffins, MD  Multiple Vitamins-Minerals (MULTIVITAMIN WITH MINERALS) tablet Take 1 tablet by mouth daily.    [provider]  pantoprazole (PROTONIX) 40 MG tablet Take 40 mg by mouth daily. 06/28/17   [provider]  Polyethyl Glycol-Propyl Glycol (SYSTANE OP) Place 1 drop into both eyes at bedtime.    [provider]  rifaximin (XIFAXAN) 200 MG tablet Take 2 tablets (400 mg total) by mouth 3 (three) times daily. 08/07/17   Gherghe, Vella Redhead, MD  SODIUM FLUORIDE, DENTAL RINSE, (PREVIDENT) 0.2 % SOLN Place 1 mL  onto teeth See admin instructions. Rinse with small amount for one minute and then spit; do not eat, drink or rinse for 30 minutes after using     [provider]  spironolactone (ALDACTONE) 100 MG tablet Take 1 tablet (100 mg total) by mouth daily. 08/07/17   Caren Griffins, MD    Family History Family History  Problem Relation Age of Onset  . Cirrhosis Mother   . Cirrhosis Father   . CAD Neg Hx     Social History Social History   Tobacco Use  . Smoking status: Light Tobacco Smoker  . Smokeless tobacco: Current User  Substance Use Topics  . Alcohol use: Yes    Comment: "one beer a week"  .  Drug use: Yes    Types: Marijuana     Allergies   Codeine   Review of Systems Review of Systems  UNABLE TO OBTAIN ROS DUE TO LEVEL 5 CAVEAT   Physical Exam Updated Vital Signs BP (!) 138/56   Pulse 91   Temp 97.8 F (36.6 C) (Oral)   Resp 16   SpO2 100%  Vitals reviewed Physical Exam  Physical Examination: General appearance - alert, well appearing, and in no distress Mental status - alert, oriented to person, place, and time Eyes -no conjunctival injection, no scleral icterus Mouth - mucous membranes moist, pharynx normal without lesions Neck - supple, no significant adenopathy Chest - clear to auscultation, no wheezes, rales or rhonchi, symmetric air entry Heart - normal rate, regular rhythm, normal S1, S2, no murmurs, rubs, clicks or gallops Abdomen - soft, nontender, nondistended, no masses or organomegaly Neurological - alert, oriented x 2, normal speech  But having difficulty following conversation Extremities - peripheral pulses normal, no pedal edema, no clubbing or cyanosis Skin - normal coloration and turgor, no rashes   ED Treatments / Results  Labs (all labs ordered are listed, but only abnormal results are displayed) Labs Reviewed  CBC WITH DIFFERENTIAL/PLATELET - Abnormal; Notable for the following components:      Result Value   RBC 3.88 (*)    Hemoglobin 11.6 (*)    HCT 34.4 (*)    Platelets 73 (*)    All other components within normal limits  COMPREHENSIVE METABOLIC PANEL - Abnormal; Notable for the following components:   Sodium 132 (*)    Potassium 2.8 (*)    Chloride 94 (*)    Glucose, Bld 128 (*)    Creatinine, Ser 1.27 (*)    Calcium 8.5 (*)    Total Protein 6.0 (*)    Albumin 3.0 (*)    Total Bilirubin 3.2 (*)    GFR calc non Af Amer 54 (*)    All other components within normal limits  AMMONIA - Abnormal; Notable for the following components:   Ammonia 101 (*)    All other components within normal limits  URINALYSIS, ROUTINE W  REFLEX MICROSCOPIC    EKG  EKG Interpretation None       Radiology No results found.  Procedures Procedures (including critical care time)  Medications Ordered in ED Medications  amphetamine-dextroamphetamine (ADDERALL) tablet 20 mg (not administered)  bisacodyl (DULCOLAX) EC tablet 5 mg (not administered)  doxycycline (VIBRAMYCIN) capsule 100 mg (not administered)  pantoprazole (PROTONIX) EC tablet 40 mg (not administered)  polyethylene glycol 0.4% and propylene glycol 0.3% (SYSTANE) ophthalmic gel (not administered)  rifaximin (XIFAXAN) tablet 400 mg (not administered)  SODIUM FLUORIDE (DENTAL RINSE) 0.2 % SOLN 1 mL (not administered)  0.9 %  sodium chloride infusion (not administered)  ondansetron (ZOFRAN) tablet 4 mg (not administered)    Or  ondansetron (ZOFRAN) injection 4 mg (not administered)  spironolactone (ALDACTONE) tablet 100 mg (not administered)  lactulose (CHRONULAC) 10 GM/15ML solution 30 g (not administered)  lactulose (CHRONULAC) enema 200 gm (not administered)  lactulose (CHRONULAC) 10 GM/15ML solution 30 g (30 g Oral Given 09/12/17 1543)     Initial Impression / Assessment and Plan / ED Course  I have reviewed the triage vital signs and the nursing notes.  Pertinent labs & imaging results that were available during my care of the patient were reviewed by me and considered in my medical decision making (see chart for details).     Pt presenting with c/o altered mental status. Hx of Hep C, hepatic failure, ammonia is over 100.  Pt has been taking lactulose TID.  Pt admitted to hospitalist service for further management.  Dose of lactulose given in the ED.  Urine is reassuring.    Final Clinical Impressions(s) / ED Diagnoses   Final diagnoses:  Altered mental status, unspecified altered mental status type  Hepatic encephalopathy [facility])    ED Discharge Orders    None       Pixie Casino, MD 09/12/17 [phone]

## 2017-09-13 ENCOUNTER — Encounter (HOSPITAL_COMMUNITY): Payer: Self-pay | Admitting: General Practice

## 2017-09-13 ENCOUNTER — Other Ambulatory Visit: Payer: Self-pay

## 2017-09-13 DIAGNOSIS — R4182 Altered mental status, unspecified: Secondary | ICD-10-CM | POA: Diagnosis not present

## 2017-09-13 DIAGNOSIS — N179 Acute kidney failure, unspecified: Secondary | ICD-10-CM

## 2017-09-13 DIAGNOSIS — K7200 Acute and subacute hepatic failure without coma: Principal | ICD-10-CM

## 2017-09-13 DIAGNOSIS — K7460 Unspecified cirrhosis of liver: Secondary | ICD-10-CM | POA: Diagnosis not present

## 2017-09-13 DIAGNOSIS — N289 Disorder of kidney and ureter, unspecified: Secondary | ICD-10-CM | POA: Diagnosis not present

## 2017-09-13 DIAGNOSIS — K7290 Hepatic failure, unspecified without coma: Secondary | ICD-10-CM | POA: Diagnosis not present

## 2017-09-13 DIAGNOSIS — G9340 Encephalopathy, unspecified: Secondary | ICD-10-CM | POA: Diagnosis not present

## 2017-09-13 DIAGNOSIS — K219 Gastro-esophageal reflux disease without esophagitis: Secondary | ICD-10-CM | POA: Diagnosis not present

## 2017-09-13 LAB — CBC
HCT: 32.3 % — ABNORMAL LOW (ref 39.0–52.0)
Hemoglobin: 10.9 g/dL — ABNORMAL LOW (ref 13.0–17.0)
MCH: 30.3 pg (ref 26.0–34.0)
MCHC: 33.7 g/dL (ref 30.0–36.0)
MCV: 89.7 fL (ref 78.0–100.0)
PLATELETS: 75 10*3/uL — AB (ref 150–400)
RBC: 3.6 MIL/uL — AB (ref 4.22–5.81)
RDW: 15.8 % — ABNORMAL HIGH (ref 11.5–15.5)
WBC: 4.4 10*3/uL (ref 4.0–10.5)

## 2017-09-13 LAB — BASIC METABOLIC PANEL
ANION GAP: 9 (ref 5–15)
BUN: 12 mg/dL (ref 6–20)
CALCIUM: 8.3 mg/dL — AB (ref 8.9–10.3)
CO2: 25 mmol/L (ref 22–32)
CREATININE: 1.05 mg/dL (ref 0.61–1.24)
Chloride: 99 mmol/L — ABNORMAL LOW (ref 101–111)
GFR calc non Af Amer: >60 mL/min (ref >60)
Glucose, Bld: 91 mg/dL (ref 65–99)
Potassium: 3.7 mmol/L (ref 3.5–5.1)
SODIUM: 133 mmol/L — AB (ref 135–145)

## 2017-09-13 LAB — AMMONIA: AMMONIA: 81 umol/L — AB (ref 9–35)

## 2017-09-13 MED ORDER — LACTULOSE 10 GM/15ML PO SOLN: 30.0000 g | Freq: Four times a day (QID) | ORAL | 3 refills | Status: AC

## 2017-09-13 MED ORDER — FUROSEMIDE 40 MG PO TABS: 40.0000 mg | ORAL_TABLET | Freq: Every day | ORAL | 1 refills | Status: AC

## 2017-09-13 NOTE — Discharge Summary (Signed)
Physician Discharge Summary   Patient ID: Joshua [name] MRN: [id number] DOB/AGE: 1945-12-19 71 y.o.  Admit date: 09/12/2017 Discharge date: 09/13/2017  Primary Care Physician:  [name], MD  Discharge Diagnoses:    . Acute hepatic encephalopathy . AKI (acute kidney injury) ([place]) . Cirrhosis ([place]) . GERD (gastroesophageal reflux disease) . Hepatitis C . HTN (hypertension) . Hyperammonemia ([place]) . Hypokalemia . Hepatic encephalopathy ([place])   Consults:  None   Recommendations for Outpatient Follow-up:  1. Patient needs to follow-up with his gastroenterologist in 2-week 2. Please repeat CBC/BMET at next visit 3. Changed Lasix to 40 mg daily, spironolactone 100 mg daily 4. Lactulose increased to 30 g qid, titrate to 3-4 BMs in a day 5. Continue same dose of rifaximin   DIET: Heart healthy diet    Allergies:   Allergies  Allergen Reactions  . Codeine Itching     DISCHARGE MEDICATIONS: Current Discharge Medication List    CONTINUE these medications which have CHANGED   Details  furosemide (LASIX) 40 MG tablet Take 1 tablet (40 mg total) daily by mouth. Qty: 30 tablet, Refills: 1    lactulose (CHRONULAC) 10 GM/15ML solution Take 45 mLs (30 g total) 4 (four) times daily by mouth. Titrate to have 3-4 BM's in a day Qty: 240 mL, Refills: 3      CONTINUE these medications which have NOT CHANGED   Details  amphetamine-dextroamphetamine (ADDERALL) 10 MG tablet Take 20 mg by mouth daily.     bisacodyl (DULCOLAX) 10 MG suppository Place 10 mg as needed rectally for moderate constipation.    bisacodyl (DULCOLAX) 5 MG EC tablet Take 5 mg by mouth daily as needed for moderate constipation.    docusate sodium (COLACE) 100 MG capsule Take 100 mg 2 (two) times daily by mouth.    hydrocortisone cream 1 % Apply 1 application 2 (two) times daily as needed topically for itching. For back    hydrOXYzine (ATARAX/VISTARIL) 25 MG tablet Take 25 mg every 6 (six)  hours as needed by mouth for itching.    Magnesium Hydroxide (MILK OF MAGNESIA CONCENTRATE PO) Take 30 mLs daily as needed by mouth (constipation).    Melatonin 5 MG TABS Take 5 mg at bedtime by mouth.    Multiple Vitamins-Minerals (MULTIVITAMIN WITH MINERALS) tablet Take 1 tablet by mouth daily.    pantoprazole (PROTONIX) 40 MG tablet Take 40 mg by mouth daily. Refills: 3    Polyethyl Glycol-Propyl Glycol (SYSTANE OP) Place 1 drop into both eyes at bedtime.    rifaximin (XIFAXAN) 200 MG tablet Take 2 tablets (400 mg total) by mouth 3 (three) times daily.    SODIUM FLUORIDE, DENTAL RINSE, (PREVIDENT) 0.2 % SOLN Place 1 mL onto teeth See admin instructions. Rinse with small amount for one minute and then spit; do not eat, drink or rinse for 30 minutes after using     spironolactone (ALDACTONE) 100 MG tablet Take 1 tablet (100 mg total) by mouth daily.    zolpidem (AMBIEN) 5 MG tablet Take 5 mg at bedtime as needed by mouth for sleep.         Brief H and P: For complete details please refer to admission H and P, but in brief patient is a 71 year old male with cirrhosis, anemia, hepatitis C, hypertension presented to ED with altered mental status.  Patient was recently discharged to rehab facility.  Sister reported that he improved and was transferred to assisted living part of the same facility.  He is compliant  with his medication however he did not have a BM recently and subsequently then showed signs of gradual worsening confusion over the last 2-3 days.  In addition she stated during his last hospitalization he was heavily diuresed and discharged on Lasix and now out of balance.  Hospital Course:     Acute hepatic encephalopathy -Improved, patient currently alert oriented x3 -Ammonia level 101 at the time of admission, recently had constipation -Patient was given lactulose enema and increase lactulose to 30 g 4 times daily, titrate to 3-4 BMs in a day.  Ammonia level decreased down  to 81. -   Continue rifaximin    Cirrhosis ([place]) due to Hepatitis C, thrombocytopenia -Last meld score 25, Lasix was held and patient was gently hydrated. -Patient was taking Lasix 80 mg twice a day with spironolactone 100 mg twice a day causing overdiuresis and dehydration -Change Lasix to 40 mg daily and Aldactone 100 mg daily as per prior notes from his GI, Dr [name] on care everywhere -He needs to follow-up with his GI in next 2 weeks for close monitoring  Hypokalemia -Likely due to overdiuresis -Lasix was held, patient was placed on gentle hydration.  Potassium was replaced. -Potassium 3.7 at the time of discharge. -Lasix decreased to 40 mg daily    HTN (hypertension) -Currently stable, continue Lasix, spironolactone  Acute kidney injury -Likely related to poor oral intake, dehydration in the setting of overdiuresis -Creatinine 1.2, improved to 1.0.  Generalized debility -PT OT evaluation was done and recommended home health PT. social work also consulted for the facility.  GERD Continue PPI   Day of Discharge BP 139/60 (BP Location: Left Arm)   Pulse 76   Temp 98 F (36.7 C) (Oral)   Resp 16   Ht 5\' 6"  (1.676 m) Comment: 08/06/17  Wt 77.1 kg (170 lb)   SpO2 97%   BMI 27.44 kg/m   Physical Exam: General: Alert and awake oriented x3 not in any acute distress. HEENT: anicteric sclera, pupils reactive to light and accommodation CVS: S1-S2 clear no murmur rubs or gallops Chest: clear to auscultation bilaterally, no wheezing rales or rhonchi Abdomen: soft nontender, nondistended, normal bowel sounds Extremities: no cyanosis, clubbing or edema noted bilaterally Neuro: Cranial nerves II-XII intact, no focal neurological deficits   The results of significant diagnostics from this hospitalization (including imaging, microbiology, ancillary and laboratory) are listed below for reference.    LAB RESULTS: Basic Metabolic Panel: Recent Labs  Lab 09/12/17 1304  09/13/17 0606  NA 132* 133*  K 2.8* 3.7  CL 94* 99*  CO2 27 25  GLUCOSE 128* 91  BUN 15 12  CREATININE 1.27* 1.05  CALCIUM 8.5* 8.3*  MG 2.0  --    Liver Function Tests: Recent Labs  Lab 09/12/17 1304  AST 39  ALT 26  ALKPHOS 97  BILITOT 3.2*  PROT 6.0*  ALBUMIN 3.0*   No results for input(s): LIPASE, AMYLASE in the last 168 hours. Recent Labs  Lab 09/12/17 2056 09/13/17 0606  AMMONIA 85* 81*   CBC: Recent Labs  Lab 09/12/17 1304 09/13/17 0606  WBC 4.9 4.4  NEUTROABS 3.2  --   HGB 11.6* 10.9*  HCT 34.4* 32.3*  MCV 88.7 89.7  PLT 73* 75*   Cardiac Enzymes: No results for input(s): CKTOTAL, CKMB, CKMBINDEX, TROPONINI in the last 168 hours. BNP: Invalid input(s): POCBNP CBG: No results for input(s): GLUCAP in the last 168 hours.  Significant Diagnostic Studies:  No results found.  Disposition and Follow-up: Discharge Instructions    Diet - low sodium heart healthy   Complete by:  As directed    Increase activity slowly   Complete by:  As directed        DISPOSITION: ALF   DISCHARGE FOLLOW-UP Follow-up Information    Marty Heck, MD. Schedule an appointment as soon as possible for a visit in 2 week(s).   Specialty:  Gastroenterology Contact information: [address] [place] [address] [phone]        [name], MD. Schedule an appointment as soon as possible for a visit in 2 week(s).   Specialty:  Internal Medicine Contact information: [place] [address] [phone]            Time spent on Discharge: 73mins   Signed:   Estill Cotta M.D. [facility] 09/13/2017, 11:52 AM Pager: [phone]

## 2017-09-13 NOTE — Evaluation (Signed)
Physical Therapy Evaluation Patient Details Name: Joshua [name] MRN: [id number] DOB: December 09, 1945 Today's Date: 09/13/2017   History of Present Illness  Joshua [name] is a 71 y.o. male who presents with AMS likely from decompensated hepatic cirrhosis. Per family, patient continues to have poor care after discharge resulting in recurrent admission. More involved level of care would be preferred at time of discharge if possible. Of note patient with chronic lower extremity wounds which appear to be stable   Clinical Impression  Pt admitted with above diagnosis. Pt currently with functional limitations due to the deficits listed below (see PT Problem List). Pt ambulated 500' part with RW and part without, pt more stable with RW, encouraged him to use at ALF. Pt mentation improving, sister reports he is not quite back to baseline. Appropriate for return to ALF.  Pt will benefit from skilled PT to increase their independence and safety with mobility to allow discharge to the venue listed below.       Follow Up Recommendations Home health PT    Equipment Recommendations  Rolling walker with 5" wheels    Recommendations for Other Services       Precautions / Restrictions Precautions Precautions: Fall Restrictions Weight Bearing Restrictions: No      Mobility  Bed Mobility Overal bed mobility: Modified Independent             General bed mobility comments: pt standing EOB when PT entered.   Transfers Overall transfer level: Modified independent Equipment used: Rolling walker (2 wheeled)             General transfer comment: began with use of RW, vc's for hand placement but pt ignored these and placed hands on RW anyway.   Ambulation/Gait Ambulation/Gait assistance: Min assist;Min guard Ambulation Distance (Feet): 500 Feet Assistive device: Rolling walker (2 wheeled);None Gait Pattern/deviations: Step-through pattern   Gait velocity interpretation: at or above normal  speed for age/gender General Gait Details: posterior lean during gait with forward head posture. Safer with RW, more unsteady without but pt's sister reports that he does not want to use RW  Stairs            Wheelchair Mobility    Modified Rankin (Stroke Patients Only)       Balance Overall balance assessment: Needs assistance Sitting-balance support: No upper extremity supported Sitting balance-Leahy Scale: Good     Standing balance support: No upper extremity supported Standing balance-Leahy Scale: Fair Standing balance comment: poor safety awareness noted, standing with catheter tube wrapped around his leg, lack of awareness also causing balance deficits                             Pertinent Vitals/Pain Pain Assessment: No/denies pain    Home Living Family/patient expects to be discharged to:: Assisted living                      Prior Function Level of Independence: Independent         Comments: independent prior to 9/25 admission     Hand Dominance   Dominant Hand: Right    Extremity/Trunk Assessment   Upper Extremity Assessment Upper Extremity Assessment: Overall WFL for tasks assessed    Lower Extremity Assessment Lower Extremity Assessment: Generalized weakness    Cervical / Trunk Assessment Cervical / Trunk Assessment: Normal  Communication   Communication: HOH  Cognition Arousal/Alertness: Awake/alert Behavior During Therapy: WFL for tasks  assessed/performed Overall Cognitive Status: No family/caregiver present to determine baseline cognitive functioning                                 General Comments: STM deficits noted, seems close to baseline      General Comments General comments (skin integrity, edema, etc.): Increased swelling B LE's    Exercises     Assessment/Plan    PT Assessment Patient needs continued PT services  PT Problem List Decreased strength;Decreased activity  tolerance;Decreased balance;Decreased mobility;Decreased knowledge of use of DME;Decreased knowledge of precautions;Decreased cognition       PT Treatment Interventions DME instruction;Gait training;Functional mobility training;Therapeutic activities;Therapeutic exercise;Balance training;Patient/family education    PT Goals (Current goals can be found in the Care Plan section)  Acute Rehab PT Goals Patient Stated Goal: return to [facility] PT Goal Formulation: With patient Time For Goal Achievement: 09/27/17 Potential to Achieve Goals: Good    Frequency Min 3X/week   Barriers to discharge        Co-evaluation               AM-PAC PT "6 Clicks" Daily Activity  Outcome Measure Difficulty turning over in bed (including adjusting bedclothes, sheets and blankets)?: None Difficulty moving from lying on back to sitting on the side of the bed? : None Difficulty sitting down on and standing up from a chair with arms (e.g., wheelchair, bedside commode, etc,.)?: None Help needed moving to and from a bed to chair (including a wheelchair)?: A Little Help needed walking in hospital room?: A Little Help needed climbing 3-5 steps with a railing? : A Little 6 Click Score: 21    End of Session Equipment Utilized During Treatment: Gait belt Activity Tolerance: Patient tolerated treatment well Patient left: in chair;with chair alarm set;with call bell/phone within reach Nurse Communication: Mobility status PT Visit Diagnosis: Difficulty in walking, not elsewhere classified (R26.2);Unsteadiness on feet (R26.81);Muscle weakness (generalized) (M62.81)    Time: 5462-7035 PT Time Calculation (min) (ACUTE ONLY): 25 min   Charges:   PT Evaluation $PT Eval Moderate Complexity: 1 Mod PT Treatments $Gait Training: 8-22 mins   PT G Codes:        Leighton Roach, PT  Acute Rehab Services  [place] 09/13/2017, 9:40 AM

## 2017-09-13 NOTE — Care Management CC44 (Signed)
Condition Code 44 Documentation Completed  Patient Details  Name: DAQUANN MERRIOTT MRN: [id number] Date of Birth: 09/12/1946   Condition Code 44 given:  Yes Patient signature on Condition Code 44 notice:  Yes Documentation of 2 MD's agreement:  Yes Code 44 added to claim:  Yes    Carles Collet, RN 09/13/2017, 11:18 AM

## 2017-09-13 NOTE — Plan of Care (Signed)
  Elimination: Will not experience complications related to bowel motility 09/13/2017 0136 - Completed/Met by Mickie Kay, RN

## 2017-09-13 NOTE — Clinical Social Work Note (Signed)
Clinical Social Work Assessment  Patient Details  Name: Joshua [name] MRN: [id number] Date of Birth: 10/30/1945  Date of referral:  09/13/17               Reason for consult:  Facility Placement                Permission sought to share information with:  Chartered certified accountant granted to share information::  Yes, Verbal Permission Granted  Name::     Ambulance person::  [facility]NF[facility]  Relationship::  sister  Contact Information:     Housing/Transportation Living arrangements for the past 2 months:  [place] of Information:  Facility, Other (Comment Required)(Sister) Patient Interpreter Needed:  None Criminal Activity/Legal Involvement Pertinent to Current Situation/Hospitalization:  No - Comment as needed Significant Relationships:  Siblings, Other Family Members Lives with:  Facility Resident Do you feel safe going back to the place where you live?  Yes Need for family participation in patient care:  Yes (Comment)  Care giving concerns:  Pt a resident at [facility] center. Pt will assist with transition back to ALF. No other needs identified.  Social Worker assessment / plan:  CSW will assist with disposition back to ALF. CSW will set up transport.  Employment status:  Retired Forensic scientist:  [employer] PT Recommendations:  [place] / Referral to community resources:  Section  Patient/Family's Response to care:  Psychologist, prison and probation services of CSW setting up transport. No other issues identified.  Patient/Family's Understanding of and Emotional Response to Diagnosis, Current Treatment, and Prognosis:  Patient/Family has good understanding of diagnosis, current treatment, prognosis as the sister aware of his physical state and indicated that she would work with ALF on follow up appointments.  CSW will assist with transition back to SNF. No issues or concerns with  hospital stay however family wants patient to have another doctor than the one managing care at [facility]. CSW validated and encouraged her to f/u with ALF on same. Family in agreement.  Emotional Assessment Appearance:  Appears stated age Attitude/Demeanor/Rapport:  (Cooperative) Affect (typically observed):  Accepting, Appropriate Orientation:  Oriented to Self, Oriented to Place, Oriented to Situation Alcohol / Substance use:  Not Applicable Psych involvement (Current and /or in the community):  No (Comment)  Discharge Needs  Concerns to be addressed:  Care Coordination Readmission within the last 30 days:  Yes Current discharge risk:  Physical Impairment Barriers to Discharge:  No Barriers Identified   Joshua Baxter, LCSW 09/13/2017, 1:07 PM

## 2017-09-13 NOTE — Progress Notes (Signed)
Report called to Independent living facility - to RN accepting the Patient.  AVS with medications and prescriptions sent with EMS and the patient at discharge.  PIV removed, along with external catheter.

## 2017-09-13 NOTE — Care Management Obs Status (Signed)
[place] NOTIFICATION   Patient Details  Name: Joshua [name] MRN: [id number] Date of Birth: December 28, 1945   Medicare Observation Status Notification Given:  Yes    Carles Collet, RN 09/13/2017, 11:17 AM

## 2017-09-13 NOTE — Social Work (Signed)
CSW f/u with RNCM on HHPT recommendation. Pt returning to nursing center at [facility] for long term care needs.  CSW left DNR on chart for doctor to sign.  Facility is ready for patient.   CSW will continue to follow for disposition back to facility.  Elissa Hefty, LCSW Clinical Social Worker [phone]

## 2017-09-13 NOTE — Social Work (Signed)
Clinical Social Worker facilitated patient discharge including contacting patient family and facility to confirm patient discharge plans.  Clinical information faxed to facility and family agreeable with plan.    CSW arranged ambulance transport via [facility] to [facility].    RN to call [phone] to give report prior to discharge.  Clinical Social Worker will sign off for now as social work intervention is no longer needed. Please consult us again if new need arises.  Braniya Farrugia, LCSW Clinical Social Worker [phone]    

## 2017-09-13 NOTE — Consult Note (Signed)
   [facility] Inpatient Consult   09/13/2017  Joshua [name] [birth year] [id number]   Chart reviewed for hospitalization in the Medicare ACO.  Patient is a resident in [place] and to return at that level of care with home health care.   Currently, in observation status and no needs in the Assisted Living for community care management.  For questions, please contact:  Natividad Brood, RN BSN [facility] Liaison  [phone] business mobile phone Toll free office [phone]

## 2017-09-13 NOTE — NC FL2 (Signed)
[place] LEVEL OF CARE SCREENING TOOL     IDENTIFICATION  Patient Name: Joshua [name] Birthdate: 08/18/1946 Sex: male Admission Date (Current Location): 09/12/2017  [facility] and [place] Number:  Herbalist and Address:  [facility] [place] [address] [place], [place] [address]      Provider Number: [id number]  Attending Physician Name and Address:  Mendel Corning, MD  Relative Name and Phone Number:  Jamael Hoffmann, [phone], sister    Current Level of Care: Hospital Recommended Level of Care: [place] Prior Approval Number:    Date Approved/Denied:   PASRR Number: [id number]  Discharge Plan:      Current Diagnoses: Patient Active Problem List   Diagnosis Date Noted  . Hypokalemia 09/12/2017  . Goals of care, counseling/discussion   . Palliative care by specialist   . Altered mental status 08/03/2017  . Nonhealing nonsurgical wound 08/03/2017  . AKI (acute kidney injury) ([place]) 08/03/2017  . GERD (gastroesophageal reflux disease) 08/03/2017  . Acute hepatic encephalopathy 07/18/2017  . Hyponatremia 07/18/2017  . Pulmonary nodules 07/18/2017  . Hepatic encephalopathy ([place]) 07/18/2017  . Hyperammonemia ([place]) 05/22/2014  . Acute encephalopathy 05/22/2014  . Renal failure (ARF), acute on chronic ([place]) 05/22/2014  . Cellulitis 02/17/2014  . Cirrhosis ([place]) 02/17/2014  . Hepatitis C 02/17/2014  . HTN (hypertension) 02/17/2014    Orientation RESPIRATION BLADDER Height & Weight     Self, Situation, Place  Normal Continent Weight: 170 lb (77.1 kg) Height:  5\' 6"  (167.6 cm)(08/06/17)  BEHAVIORAL SYMPTOMS/MOOD NEUROLOGICAL BOWEL NUTRITION STATUS      Continent Diet(See DC Summary)  AMBULATORY STATUS COMMUNICATION OF NEEDS Skin   Independent Verbally Normal                       Personal Care Assistance Level of Assistance              Functional Limitations Info  Sight, Hearing,  Speech Sight Info: Adequate Hearing Info: Adequate Speech Info: Adequate    SPECIAL CARE FACTORS FREQUENCY                       Contractures Contractures Info: Not present    Additional Factors Info  Code Status, Allergies Code Status Info: DNR Allergies Info: Codeine           Current Medications (09/13/2017):  This is the current hospital active medication list Current Facility-Administered Medications  Medication Dose Route Frequency Provider Last Rate Last Dose  . amphetamine-dextroamphetamine (ADDERALL) tablet 20 mg  20 mg Oral Daily Radene Gunning, NP   20 mg at 09/13/17 0945  . artificial tears (LACRILUBE) ophthalmic ointment 1 application  1 application Both Eyes QHS Radene Gunning, NP   1 application at 82/42/35 2212  . bisacodyl (DULCOLAX) EC tablet 5 mg  5 mg Oral Daily PRN Radene Gunning, NP      . lactulose (CHRONULAC) 10 GM/15ML solution 30 g  30 g Oral QID Radene Gunning, NP   30 g at 09/13/17 0945  . ondansetron (ZOFRAN) tablet 4 mg  4 mg Oral Q6H PRN Radene Gunning, NP       Or  . ondansetron [facility]) injection 4 mg  4 mg Intravenous Q6H PRN Black, Karen M, NP      . pantoprazole (PROTONIX) EC tablet 40 mg  40 mg Oral Daily Black, Lezlie Octave, NP   40  mg at 09/13/17 0945  . rifaximin (XIFAXAN) tablet 400 mg  400 mg Oral TID Radene Gunning, NP   400 mg at 09/13/17 0945  . spironolactone (ALDACTONE) tablet 100 mg  100 mg Oral Daily Radene Gunning, NP   100 mg at 09/13/17 0945     Discharge Medications: Please see discharge summary for a list of discharge medications.  Relevant Imaging Results:  Relevant Lab Results:   Additional Information SS#:[id number] [place], LCSW

## 2017-09-13 NOTE — Clinical Social Work Placement (Signed)
   CLINICAL SOCIAL WORK PLACEMENT  NOTE  Date:  09/13/2017  Patient Details  Name: Joshua [name] MRN: [id number] Date of Birth: 11-17-1945  Clinical Social Work is seeking post-discharge placement for this patient at the [place] level of care (*CSW will initial, date and re-position this form in  chart as items are completed):  Yes   Patient/family provided with [facility] Work Department's list of facilities offering this level of care within the geographic area requested by the patient (or if unable, by the patient's family).  Yes   Patient/family informed of their freedom to choose among providers that offer the needed level of care, that participate in Medicare, Medicaid or managed care program needed by the patient, have an available bed and are willing to accept the patient.  Yes   Patient/family informed of [facility]'s ownership interest in [facility] and [facility], as well as of the fact that they are under no obligation to receive care at these facilities.  PASRR submitted to EDS on       PASRR number received on       Existing PASRR number confirmed on 09/13/17     FL2 transmitted to all facilities in geographic area requested by pt/family on       FL2 transmitted to all facilities within larger geographic area on 09/13/17     Patient informed that his/her managed care company has contracts with or will negotiate with certain facilities, including the following:        Yes   Patient/family informed of bed offers received.  Patient chooses bed at [facility]     Physician recommends and patient chooses bed at      Patient to be transferred to [facility] on 09/13/17.  Patient to be transferred to facility by [facility]     Patient family notified on 09/13/17 of transfer.  Name of family member notified:  sister [name] contacted.     PHYSICIAN       Additional Comment:     _______________________________________________ Normajean Baxter, LCSW 09/13/2017, 1:19 PM

## 2017-09-14 DIAGNOSIS — I10 Essential (primary) hypertension: Secondary | ICD-10-CM | POA: Diagnosis not present

## 2017-09-14 DIAGNOSIS — R278 Other lack of coordination: Secondary | ICD-10-CM | POA: Diagnosis not present

## 2017-09-14 DIAGNOSIS — G9349 Other encephalopathy: Secondary | ICD-10-CM | POA: Diagnosis not present

## 2017-09-14 DIAGNOSIS — R600 Localized edema: Secondary | ICD-10-CM | POA: Diagnosis not present

## 2017-09-14 DIAGNOSIS — R41841 Cognitive communication deficit: Secondary | ICD-10-CM | POA: Diagnosis not present

## 2017-09-14 DIAGNOSIS — N179 Acute kidney failure, unspecified: Secondary | ICD-10-CM | POA: Diagnosis not present

## 2017-09-14 DIAGNOSIS — R2689 Other abnormalities of gait and mobility: Secondary | ICD-10-CM | POA: Diagnosis not present

## 2017-09-14 DIAGNOSIS — K219 Gastro-esophageal reflux disease without esophagitis: Secondary | ICD-10-CM | POA: Diagnosis not present

## 2017-09-14 DIAGNOSIS — M6281 Muscle weakness (generalized): Secondary | ICD-10-CM | POA: Diagnosis not present

## 2017-09-14 DIAGNOSIS — S81801D Unspecified open wound, right lower leg, subsequent encounter: Secondary | ICD-10-CM | POA: Diagnosis not present

## 2017-09-14 DIAGNOSIS — B182 Chronic viral hepatitis C: Secondary | ICD-10-CM | POA: Diagnosis not present

## 2017-09-14 DIAGNOSIS — B1920 Unspecified viral hepatitis C without hepatic coma: Secondary | ICD-10-CM | POA: Diagnosis not present

## 2017-09-14 DIAGNOSIS — S81802D Unspecified open wound, left lower leg, subsequent encounter: Secondary | ICD-10-CM | POA: Diagnosis not present

## 2017-09-14 DIAGNOSIS — K7290 Hepatic failure, unspecified without coma: Secondary | ICD-10-CM | POA: Diagnosis not present

## 2017-09-15 DIAGNOSIS — M6281 Muscle weakness (generalized): Secondary | ICD-10-CM | POA: Diagnosis not present

## 2017-09-15 DIAGNOSIS — R278 Other lack of coordination: Secondary | ICD-10-CM | POA: Diagnosis not present

## 2017-09-15 DIAGNOSIS — R41841 Cognitive communication deficit: Secondary | ICD-10-CM | POA: Diagnosis not present

## 2017-09-15 DIAGNOSIS — R2689 Other abnormalities of gait and mobility: Secondary | ICD-10-CM | POA: Diagnosis not present

## 2017-09-15 DIAGNOSIS — G9349 Other encephalopathy: Secondary | ICD-10-CM | POA: Diagnosis not present

## 2017-09-15 DIAGNOSIS — I10 Essential (primary) hypertension: Secondary | ICD-10-CM | POA: Diagnosis not present

## 2017-09-17 DIAGNOSIS — G9349 Other encephalopathy: Secondary | ICD-10-CM | POA: Diagnosis not present

## 2017-09-17 DIAGNOSIS — M6281 Muscle weakness (generalized): Secondary | ICD-10-CM | POA: Diagnosis not present

## 2017-09-17 DIAGNOSIS — R2689 Other abnormalities of gait and mobility: Secondary | ICD-10-CM | POA: Diagnosis not present

## 2017-09-17 DIAGNOSIS — R278 Other lack of coordination: Secondary | ICD-10-CM | POA: Diagnosis not present

## 2017-09-17 DIAGNOSIS — R41841 Cognitive communication deficit: Secondary | ICD-10-CM | POA: Diagnosis not present

## 2017-09-17 DIAGNOSIS — I10 Essential (primary) hypertension: Secondary | ICD-10-CM | POA: Diagnosis not present

## 2017-09-18 DIAGNOSIS — R2689 Other abnormalities of gait and mobility: Secondary | ICD-10-CM | POA: Diagnosis not present

## 2017-09-18 DIAGNOSIS — R41841 Cognitive communication deficit: Secondary | ICD-10-CM | POA: Diagnosis not present

## 2017-09-18 DIAGNOSIS — I10 Essential (primary) hypertension: Secondary | ICD-10-CM | POA: Diagnosis not present

## 2017-09-18 DIAGNOSIS — G9349 Other encephalopathy: Secondary | ICD-10-CM | POA: Diagnosis not present

## 2017-09-18 DIAGNOSIS — R278 Other lack of coordination: Secondary | ICD-10-CM | POA: Diagnosis not present

## 2017-09-18 DIAGNOSIS — M6281 Muscle weakness (generalized): Secondary | ICD-10-CM | POA: Diagnosis not present

## 2017-09-19 DIAGNOSIS — I10 Essential (primary) hypertension: Secondary | ICD-10-CM | POA: Diagnosis not present

## 2017-09-19 DIAGNOSIS — R278 Other lack of coordination: Secondary | ICD-10-CM | POA: Diagnosis not present

## 2017-09-19 DIAGNOSIS — R2689 Other abnormalities of gait and mobility: Secondary | ICD-10-CM | POA: Diagnosis not present

## 2017-09-19 DIAGNOSIS — R41841 Cognitive communication deficit: Secondary | ICD-10-CM | POA: Diagnosis not present

## 2017-09-19 DIAGNOSIS — G9349 Other encephalopathy: Secondary | ICD-10-CM | POA: Diagnosis not present

## 2017-09-19 DIAGNOSIS — M6281 Muscle weakness (generalized): Secondary | ICD-10-CM | POA: Diagnosis not present

## 2017-09-19 NOTE — Progress Notes (Signed)
Late Entry G Code for 10/05/17    10/05/2017 1100  PT Visit Information  Last PT Received On 10-05-17  PT G-Codes **NOT FOR INPATIENT CLASS**  Functional Assessment Tool Used AM-PAC 6 Clicks Basic Mobility  Functional Limitation Mobility: Walking and moving around  Mobility: Walking and Moving Around Current Status [phone]) CJ  Mobility: Walking and Moving Around Goal Status [phone]) CI  Leighton Roach, [place]

## 2017-09-20 DIAGNOSIS — R278 Other lack of coordination: Secondary | ICD-10-CM | POA: Diagnosis not present

## 2017-09-20 DIAGNOSIS — M6281 Muscle weakness (generalized): Secondary | ICD-10-CM | POA: Diagnosis not present

## 2017-09-20 DIAGNOSIS — I10 Essential (primary) hypertension: Secondary | ICD-10-CM | POA: Diagnosis not present

## 2017-09-20 DIAGNOSIS — R2689 Other abnormalities of gait and mobility: Secondary | ICD-10-CM | POA: Diagnosis not present

## 2017-09-20 DIAGNOSIS — G9349 Other encephalopathy: Secondary | ICD-10-CM | POA: Diagnosis not present

## 2017-09-20 DIAGNOSIS — R41841 Cognitive communication deficit: Secondary | ICD-10-CM | POA: Diagnosis not present

## 2017-09-21 DIAGNOSIS — S81801D Unspecified open wound, right lower leg, subsequent encounter: Secondary | ICD-10-CM | POA: Diagnosis not present

## 2017-09-21 DIAGNOSIS — S81802D Unspecified open wound, left lower leg, subsequent encounter: Secondary | ICD-10-CM | POA: Diagnosis not present

## 2017-09-23 DIAGNOSIS — I872 Venous insufficiency (chronic) (peripheral): Secondary | ICD-10-CM | POA: Diagnosis not present

## 2017-09-23 DIAGNOSIS — I10 Essential (primary) hypertension: Secondary | ICD-10-CM | POA: Diagnosis not present

## 2017-09-23 DIAGNOSIS — B1920 Unspecified viral hepatitis C without hepatic coma: Secondary | ICD-10-CM | POA: Diagnosis not present

## 2017-09-23 DIAGNOSIS — K7460 Unspecified cirrhosis of liver: Secondary | ICD-10-CM | POA: Diagnosis not present

## 2017-09-23 DIAGNOSIS — I890 Lymphedema, not elsewhere classified: Secondary | ICD-10-CM | POA: Diagnosis not present

## 2017-09-23 DIAGNOSIS — N179 Acute kidney failure, unspecified: Secondary | ICD-10-CM | POA: Diagnosis not present

## 2017-09-28 DIAGNOSIS — S81801D Unspecified open wound, right lower leg, subsequent encounter: Secondary | ICD-10-CM | POA: Diagnosis not present

## 2017-09-28 DIAGNOSIS — S81802D Unspecified open wound, left lower leg, subsequent encounter: Secondary | ICD-10-CM | POA: Diagnosis not present

## 2017-09-30 DIAGNOSIS — L97211 Non-pressure chronic ulcer of right calf limited to breakdown of skin: Secondary | ICD-10-CM | POA: Diagnosis not present

## 2017-09-30 DIAGNOSIS — L97228 Non-pressure chronic ulcer of left calf with other specified severity: Secondary | ICD-10-CM | POA: Diagnosis not present

## 2017-09-30 DIAGNOSIS — K7290 Hepatic failure, unspecified without coma: Secondary | ICD-10-CM | POA: Diagnosis not present

## 2017-09-30 DIAGNOSIS — R600 Localized edema: Secondary | ICD-10-CM | POA: Diagnosis not present

## 2017-10-04 DIAGNOSIS — S81802D Unspecified open wound, left lower leg, subsequent encounter: Secondary | ICD-10-CM | POA: Diagnosis not present

## 2017-10-04 DIAGNOSIS — K7290 Hepatic failure, unspecified without coma: Secondary | ICD-10-CM | POA: Diagnosis not present

## 2017-10-04 DIAGNOSIS — R296 Repeated falls: Secondary | ICD-10-CM | POA: Diagnosis not present

## 2017-10-04 DIAGNOSIS — K7460 Unspecified cirrhosis of liver: Secondary | ICD-10-CM | POA: Diagnosis not present

## 2017-10-05 DIAGNOSIS — S81801D Unspecified open wound, right lower leg, subsequent encounter: Secondary | ICD-10-CM | POA: Diagnosis not present

## 2017-10-05 DIAGNOSIS — S81802D Unspecified open wound, left lower leg, subsequent encounter: Secondary | ICD-10-CM | POA: Diagnosis not present

## 2017-10-06 DIAGNOSIS — Z79899 Other long term (current) drug therapy: Secondary | ICD-10-CM | POA: Diagnosis not present

## 2017-10-12 DIAGNOSIS — S81802D Unspecified open wound, left lower leg, subsequent encounter: Secondary | ICD-10-CM | POA: Diagnosis not present

## 2017-10-12 DIAGNOSIS — S81801D Unspecified open wound, right lower leg, subsequent encounter: Secondary | ICD-10-CM | POA: Diagnosis not present

## 2017-10-17 ENCOUNTER — Emergency Department (HOSPITAL_COMMUNITY): Payer: Medicare Other

## 2017-10-17 ENCOUNTER — Encounter (HOSPITAL_COMMUNITY): Payer: Self-pay | Admitting: Nurse Practitioner

## 2017-10-17 ENCOUNTER — Emergency Department (HOSPITAL_COMMUNITY)
Admission: EM | Admit: 2017-10-17 | Discharge: 2017-10-17 | Disposition: A | Discharge status: Home / Self Care | Payer: Medicare Other | Attending: Emergency Medicine | Admitting: Emergency Medicine

## 2017-10-17 DIAGNOSIS — I2699 Other pulmonary embolism without acute cor pulmonale: Secondary | ICD-10-CM | POA: Diagnosis not present

## 2017-10-17 DIAGNOSIS — Z87891 Personal history of nicotine dependence: Secondary | ICD-10-CM | POA: Insufficient documentation

## 2017-10-17 DIAGNOSIS — Z79899 Other long term (current) drug therapy: Secondary | ICD-10-CM | POA: Diagnosis not present

## 2017-10-17 DIAGNOSIS — R600 Localized edema: Secondary | ICD-10-CM | POA: Insufficient documentation

## 2017-10-17 DIAGNOSIS — R0602 Shortness of breath: Secondary | ICD-10-CM | POA: Diagnosis not present

## 2017-10-17 DIAGNOSIS — I824Y2 Acute embolism and thrombosis of unspecified deep veins of left proximal lower extremity: Secondary | ICD-10-CM | POA: Insufficient documentation

## 2017-10-17 DIAGNOSIS — I744 Embolism and thrombosis of arteries of extremities, unspecified: Secondary | ICD-10-CM | POA: Diagnosis not present

## 2017-10-17 DIAGNOSIS — L97228 Non-pressure chronic ulcer of left calf with other specified severity: Secondary | ICD-10-CM | POA: Diagnosis not present

## 2017-10-17 DIAGNOSIS — L97509 Non-pressure chronic ulcer of other part of unspecified foot with unspecified severity: Secondary | ICD-10-CM | POA: Diagnosis not present

## 2017-10-17 DIAGNOSIS — I129 Hypertensive chronic kidney disease with stage 1 through stage 4 chronic kidney disease, or unspecified chronic kidney disease: Secondary | ICD-10-CM | POA: Insufficient documentation

## 2017-10-17 DIAGNOSIS — N189 Chronic kidney disease, unspecified: Secondary | ICD-10-CM | POA: Diagnosis not present

## 2017-10-17 DIAGNOSIS — G8911 Acute pain due to trauma: Secondary | ICD-10-CM | POA: Diagnosis not present

## 2017-10-17 DIAGNOSIS — Z7901 Long term (current) use of anticoagulants: Secondary | ICD-10-CM | POA: Insufficient documentation

## 2017-10-17 DIAGNOSIS — K7469 Other cirrhosis of liver: Secondary | ICD-10-CM | POA: Diagnosis not present

## 2017-10-17 DIAGNOSIS — T148XXD Other injury of unspecified body region, subsequent encounter: Secondary | ICD-10-CM | POA: Diagnosis not present

## 2017-10-17 DIAGNOSIS — L97211 Non-pressure chronic ulcer of right calf limited to breakdown of skin: Secondary | ICD-10-CM | POA: Diagnosis not present

## 2017-10-17 DIAGNOSIS — R9431 Abnormal electrocardiogram [ECG] [EKG]: Secondary | ICD-10-CM | POA: Diagnosis not present

## 2017-10-17 DIAGNOSIS — M79605 Pain in left leg: Secondary | ICD-10-CM | POA: Diagnosis not present

## 2017-10-17 LAB — COMPREHENSIVE METABOLIC PANEL
ALBUMIN: 3.1 g/dL — AB (ref 3.5–5.0)
ALT: 38 U/L (ref 17–63)
ANION GAP: 10 (ref 5–15)
AST: 44 U/L — ABNORMAL HIGH (ref 15–41)
Alkaline Phosphatase: 105 U/L (ref 38–126)
BUN: 9 mg/dL (ref 6–20)
CHLORIDE: 98 mmol/L — AB (ref 101–111)
CO2: 25 mmol/L (ref 22–32)
Calcium: 8.4 mg/dL — ABNORMAL LOW (ref 8.9–10.3)
Creatinine, Ser: 0.95 mg/dL (ref 0.61–1.24)
GFR calc non Af Amer: >60 mL/min (ref >60)
Glucose, Bld: 93 mg/dL (ref 65–99)
Potassium: 3.6 mmol/L (ref 3.5–5.1)
SODIUM: 133 mmol/L — AB (ref 135–145)
Total Bilirubin: 2 mg/dL — ABNORMAL HIGH (ref 0.3–1.2)
Total Protein: 6.5 g/dL (ref 6.5–8.1)

## 2017-10-17 LAB — CBC WITH DIFFERENTIAL/PLATELET
BASOS ABS: 0 10*3/uL (ref 0.0–0.1)
Basophils Relative: 0 %
EOS ABS: 0.1 10*3/uL (ref 0.0–0.7)
Eosinophils Relative: 1 %
HEMATOCRIT: 36 % — AB (ref 39.0–52.0)
Hemoglobin: 12.1 g/dL — ABNORMAL LOW (ref 13.0–17.0)
LYMPHS PCT: 17 %
Lymphs Abs: 0.9 10*3/uL (ref 0.7–4.0)
MCH: 29.6 pg (ref 26.0–34.0)
MCHC: 33.6 g/dL (ref 30.0–36.0)
MCV: 88 fL (ref 78.0–100.0)
Monocytes Absolute: 0.7 10*3/uL (ref 0.1–1.0)
Monocytes Relative: 13 %
NEUTROS ABS: 3.5 10*3/uL (ref 1.7–7.7)
Neutrophils Relative %: 69 %
PLATELETS: 73 10*3/uL — AB (ref 150–400)
RBC: 4.09 MIL/uL — AB (ref 4.22–5.81)
RDW: 14.7 % (ref 11.5–15.5)
WBC: 5.2 10*3/uL (ref 4.0–10.5)

## 2017-10-17 LAB — I-STAT TROPONIN, ED: Troponin i, poc: 0 ng/mL (ref 0.00–0.08)

## 2017-10-17 LAB — BRAIN NATRIURETIC PEPTIDE: B NATRIURETIC PEPTIDE 5: 117.5 pg/mL — AB (ref 0.0–100.0)

## 2017-10-17 LAB — PROTIME-INR
INR: 1.75
Prothrombin Time: 20.3 seconds — ABNORMAL HIGH (ref 11.4–15.2)

## 2017-10-17 MED ORDER — ELIQUIS 5 MG VTE STARTER PACK: ORAL_TABLET | ORAL | 0 refills | Status: AC

## 2017-10-17 MED ORDER — IOPAMIDOL (ISOVUE-370) INJECTION 76%
INTRAVENOUS | Status: AC
  Administered 2017-10-17: 100 mL
  Filled 2017-10-17: qty 100

## 2017-10-17 MED ORDER — APIXABAN 5 MG PO TABS
10.0000 mg | ORAL_TABLET | Freq: Once | ORAL | Status: AC
  Administered 2017-10-17: 10 mg via ORAL
  Filled 2017-10-17: qty 2

## 2017-10-17 NOTE — ED Provider Notes (Signed)
[facility] EMERGENCY DEPARTMENT Provider Note   CSN: [id number] Arrival date & time: 10/17/17  1428     History   Chief Complaint Chief Complaint  Patient presents with  . DVT    HPI Joshua [name] is a 71 y.o. male hx of cirrhosis, hep C, HTN, recent admission for AKI, chronic leg swelling here with DVT.  Patient was recently admitted to the hospital for hepatic encephalopathy and AK I.  Patient went back to [facility].  Patient was noted to have worsening bilateral leg swelling especially on the left for the last week or so.  He had a ultrasound done at the facility that showed a femoral and popliteal DVT on the left side.  Sister visits him daily and states that yesterday he appears a little short of breath and was allowed to.  Patient states that he feels fine today and denies any current shortness of breath or chest pain.  Denies any fevers or chills or cough.  Denies any previous history of blood clots and is not currently on blood thinners.  The history is provided by the patient.    Past Medical History:  Diagnosis Date  . Anemia    low platelets/liver disease  . Arthritis   . Cirrhosis ([place])   . Hepatitis C   . Hypertension   . Wears glasses   . Wears partial dentures    top partial    Patient Active Problem List   Diagnosis Date Noted  . Hypokalemia 09/12/2017  . Goals of care, counseling/discussion   . Palliative care by specialist   . Altered mental status 08/03/2017  . Nonhealing nonsurgical wound 08/03/2017  . AKI (acute kidney injury) ([place]) 08/03/2017  . GERD (gastroesophageal reflux disease) 08/03/2017  . Acute hepatic encephalopathy 07/18/2017  . Hyponatremia 07/18/2017  . Pulmonary nodules 07/18/2017  . Hepatic encephalopathy ([place]) 07/18/2017  . Hyperammonemia ([place]) 05/22/2014  . Acute encephalopathy 05/22/2014  . Renal failure (ARF), acute on chronic ([place]) 05/22/2014  . Cellulitis 02/17/2014  . Cirrhosis ([place]) 02/17/2014  .  Hepatitis C 02/17/2014  . HTN (hypertension) 02/17/2014    Past Surgical History:  Procedure Laterality Date  . APPENDECTOMY    . COLONOSCOPY    . EYE SURGERY     both cataracts  . PILONIDAL CYST EXCISION  1960  . SHOULDER ARTHROSCOPY WITH SUBACROMIAL DECOMPRESSION, ROTATOR CUFF REPAIR AND BICEP TENDON REPAIR Left 08/09/2013   Procedure: LEFT SHOULDER ARTHROSCOPY WITH SUBACROMIAL DECOMPRESSION, DISTAL CLAVICULECTOMY;  Surgeon: Ninetta Lights, MD;  Location: [facility];  Service: Orthopedics;  Laterality: Left;  . TONSILLECTOMY    . UPPER GI ENDOSCOPY         Home Medications    Prior to Admission medications   Medication Sig Start Date End Date Taking? Authorizing Provider  amphetamine-dextroamphetamine (ADDERALL) 10 MG tablet Take 20 mg by mouth daily.  06/10/12  Yes [provider]  bisacodyl (DULCOLAX) 10 MG suppository Place 10 mg as needed rectally for moderate constipation.   Yes [provider]  bisacodyl (DULCOLAX) 5 MG EC tablet Take 5 mg by mouth daily as needed for moderate constipation.   Yes [provider]  docusate sodium (COLACE) 100 MG capsule Take 100 mg 2 (two) times daily by mouth.   Yes [provider]  furosemide (LASIX) 40 MG tablet Take 1 tablet (40 mg total) daily by mouth. 09/14/17  Yes Rai, Ripudeep K, MD  hydrocortisone cream 1 % Apply 1 application 2 (  two) times daily as needed topically for itching. For back   Yes [provider]  hydrOXYzine (ATARAX/VISTARIL) 25 MG tablet Take 25 mg every 6 (six) hours as needed by mouth for itching.   Yes [provider]  lactulose (CHRONULAC) 10 GM/15ML solution Take 45 mLs (30 g total) 4 (four) times daily by mouth. Titrate to have 3-4 BM's in a day 09/13/17  Yes Rai, Ripudeep K, MD  Magnesium Hydroxide (MILK OF MAGNESIA CONCENTRATE PO) Take 30 mLs daily as needed by mouth (constipation).   Yes [provider]  Melatonin 5 MG TABS Take 5 mg  at bedtime by mouth.   Yes [provider]  Multiple Vitamins-Minerals (MULTIVITAMIN WITH MINERALS) tablet Take 1 tablet by mouth daily.   Yes [provider]  pantoprazole (PROTONIX) 40 MG tablet Take 40 mg by mouth daily. 06/28/17  Yes [provider]  Polyethyl Glycol-Propyl Glycol (SYSTANE OP) Place 1 drop into both eyes at bedtime.   Yes [provider]  rifaximin (XIFAXAN) 200 MG tablet Take 2 tablets (400 mg total) by mouth 3 (three) times daily. 08/07/17  Yes Gherghe, Vella Redhead, MD  SODIUM FLUORIDE, DENTAL RINSE, (PREVIDENT) 0.2 % SOLN Place 1 mL onto teeth See admin instructions. Rinse with small amount for one minute and then spit; do not eat, drink or rinse for 30 minutes after using    Yes [provider]  spironolactone (ALDACTONE) 100 MG tablet Take 1 tablet (100 mg total) by mouth daily. Patient taking differently: Take 100 mg 2 (two) times daily by mouth.  08/07/17  Yes Gherghe, Vella Redhead, MD  zolpidem (AMBIEN) 5 MG tablet Take 5 mg at bedtime as needed by mouth for sleep.   Yes [provider]  ELIQUIS STARTER PACK (ELIQUIS STARTER PACK) 5 MG TABS Take as directed on package: start with two-5mg  tablets twice daily for 7 days. On day 8, switch to one-5mg  tablet twice daily. 10/17/17   Drenda Freeze, MD    Family History Family History  Problem Relation Age of Onset  . Cirrhosis Mother   . Cirrhosis Father   . CAD Neg Hx     Social History Social History   Tobacco Use  . Smoking status: Former Smoker    Packs/day: 1.00    Years: 45.00    Pack years: 45.00    Types: Cigarettes  . Smokeless tobacco: Never Used  Substance Use Topics  . Alcohol use: Yes    Comment: "one beer a week"  . Drug use: Yes    Types: Marijuana     Allergies   Codeine   Review of Systems Review of Systems  Cardiovascular: Positive for leg swelling.  All other systems reviewed and are negative.    Physical Exam Updated Vital  Signs BP (!) 160/75 (BP Location: Right Arm)   Pulse 92   Temp 98 F (36.7 C) (Oral)   Resp 20   Ht 5\' 5"  (1.651 m)   Wt 81.6 kg (180 lb)   SpO2 97%   BMI 29.95 kg/m   Physical Exam  Constitutional: He is oriented to person, place, and time. He appears well-developed and well-nourished.  HENT:  Head: Normocephalic.  Mouth/Throat: Oropharynx is clear and moist.  Eyes: Conjunctivae and EOM are normal. Pupils are equal, round, and reactive to light.  Neck: Normal range of motion. Neck supple.  Cardiovascular: Normal rate, regular rhythm and normal heart sounds.  Pulmonary/Chest: Effort normal and breath sounds normal. No stridor.  No respiratory distress. He has no wheezes.  Abdominal: Soft. Bowel sounds are normal. He exhibits no distension. There is no tenderness. There is no guarding.  Musculoskeletal:  2+ edema bilateral legs, mild L calf tenderness   Neurological: He is alert and oriented to person, place, and time. No cranial nerve deficit. Coordination normal.  Skin: Skin is warm.  Psychiatric: He has a normal mood and affect.  Nursing note and vitals reviewed.    ED Treatments / Results  Labs (all labs ordered are listed, but only abnormal results are displayed) Labs Reviewed  CBC WITH DIFFERENTIAL/PLATELET - Abnormal; Notable for the following components:      Result Value   RBC 4.09 (*)    Hemoglobin 12.1 (*)    HCT 36.0 (*)    Platelets 73 (*)    All other components within normal limits  PROTIME-INR - Abnormal; Notable for the following components:   Prothrombin Time 20.3 (*)    All other components within normal limits  COMPREHENSIVE METABOLIC PANEL - Abnormal; Notable for the following components:   Sodium 133 (*)    Chloride 98 (*)    Calcium 8.4 (*)    Albumin 3.1 (*)    AST 44 (*)    Total Bilirubin 2.0 (*)    All other components within normal limits  BRAIN NATRIURETIC PEPTIDE - Abnormal; Notable for the following components:   B Natriuretic Peptide  117.5 (*)    All other components within normal limits  I-STAT TROPONIN, ED    EKG  EKG Interpretation  Date/Time:  Monday October 17 2017 16:59:20 EST Ventricular Rate:  86 PR Interval:  220 QRS Duration: 90 QT Interval:  404 QTC Calculation: 483 R Axis:   58 Text Interpretation:  Sinus rhythm with 1st degree A-V block with occasional Premature ventricular complexes Prolonged QT Abnormal ECG No significant change since last tracing Confirmed by Wandra Arthurs [phone]) on 10/17/2017 5:40:35 PM       Radiology Ct Angio Chest Pe W And/or Wo Contrast  Result Date: 10/17/2017 CLINICAL DATA:  Lower extremity edema with apparent left lower extremity DVT. EXAM: CT ANGIOGRAPHY CHEST WITH CONTRAST TECHNIQUE: Multidetector CT imaging of the chest was performed using the standard protocol during bolus administration of intravenous contrast. Multiplanar CT image reconstructions and MIPs were obtained to evaluate the vascular anatomy. CONTRAST:  120mL ISOVUE-370 IOPAMIDOL (ISOVUE-370) INJECTION 76% COMPARISON:  None. FINDINGS: Cardiovascular: The pulmonary arteries are adequately opacified. There is no evidence of pulmonary embolism. The thoracic aorta is normal in caliber. The heart size is normal. No pericardial fluid. Mild amount of calcified plaque is suspected in the distribution of the LAD and left circumflex coronary arteries. Mediastinum/Nodes: No evidence of mediastinal, hilar or axillary lymphadenopathy. Lungs/Pleura: Minimal scarring at the lung bases. There is no evidence of pulmonary edema, consolidation, pneumothorax, nodule or pleural fluid. Upper Abdomen: The liver is overtly nodular, consistent with cirrhosis. The spleen appears at least mildly enlarged. There may be gastroesophageal varices. These are not opacified at the time of imaging due to early contrast enhancement. No ascites is seen in the upper abdomen. Musculoskeletal: No chest wall abnormality. No acute or significant osseous  findings. Soft tissues show bilateral gynecomastia. Review of the MIP images confirms the above findings. IMPRESSION: 1. No evidence of pulmonary embolism or other acute pulmonary process. 2. Coronary atherosclerosis with mild calcified plaque suspected in the distribution of the LAD and left circumflex coronary arteries. 3. Cirrhotic liver splenomegaly.  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  Electronically Signed   By: Aletta Edouard M.D.   On: 10/17/2017 17:47    Procedures Procedures (including critical care time)  Medications Ordered in ED Medications  apixaban (ELIQUIS) tablet 10 mg (not administered)  iopamidol (ISOVUE-370) 76 % injection (100 mLs  Contrast Given 10/17/17 1709)     Initial Impression / Assessment and Plan / ED Course  I have reviewed the triage vital signs and the nursing notes.  Pertinent labs & imaging results that were available during my care of the patient were reviewed by me and considered in my medical decision making (see chart for details).    AMRAM MAYA is a 71 y.o. male here with shortness of breath, L leg DVT. Patient from nursing home. Slightly tachypneic but not tachycardic and not hypoxic. Reviewed records from nursing home. Patient does have confirmed DVT L leg. Will get CTA chest and if negative, can be started on anticoagulant.   6:53 PM CTA showed no PE. Labs unremarkable. Vitals stable. I called pharmacy since his INR is 1.7 and he has cirrhosis regarding anticoagulation options. They suggest eliquis starter pack with 5 mg BID x 1 week then 5 mg daily. Stable for dc back to nursing home.     Final Clinical Impressions(s) / ED Diagnoses   Final diagnoses:  None    ED Discharge Orders        Ordered    ELIQUIS STARTER PACK [facility] STARTER PACK) 5 MG TABS     10/17/17 1835       Drenda Freeze, MD 10/17/17 [phone]

## 2017-10-17 NOTE — ED Triage Notes (Signed)
Per EMS pt from [facility] presented left leg swelling facility provider ordered US and positive for Left leg DVT and sent to ER for treatment. Pt denies pain and ambulatory

## 2017-10-17 NOTE — ED Notes (Signed)
Signature pad unavailable at time of departure. Pt verbalized understanding of discharge instructions.

## 2017-10-17 NOTE — ED Notes (Signed)
ED Provider at bedside. 

## 2017-10-17 NOTE — ED Notes (Signed)
Patient transported to CT 

## 2017-10-17 NOTE — Discharge Instructions (Signed)
Take eliquis starter pack as prescribed (10 mg twice daily for 7 days then switch to 5 mg twice daily).   Continue your current meds.   See your doctor in a week   Return to ER if you have worse leg swelling, trouble breathing.

## 2017-10-17 NOTE — ED Notes (Signed)
Pt returned from CT. CT waiting for blood work to return.

## 2017-10-19 DIAGNOSIS — S81801D Unspecified open wound, right lower leg, subsequent encounter: Secondary | ICD-10-CM | POA: Diagnosis not present

## 2017-10-19 DIAGNOSIS — S81802D Unspecified open wound, left lower leg, subsequent encounter: Secondary | ICD-10-CM | POA: Diagnosis not present

## 2017-10-26 DIAGNOSIS — S81802D Unspecified open wound, left lower leg, subsequent encounter: Secondary | ICD-10-CM | POA: Diagnosis not present

## 2017-10-26 DIAGNOSIS — S81801D Unspecified open wound, right lower leg, subsequent encounter: Secondary | ICD-10-CM | POA: Diagnosis not present

## 2017-10-29 DIAGNOSIS — M7989 Other specified soft tissue disorders: Secondary | ICD-10-CM | POA: Diagnosis not present

## 2017-10-31 DIAGNOSIS — H532 Diplopia: Secondary | ICD-10-CM | POA: Diagnosis not present

## 2017-10-31 DIAGNOSIS — H401231 Low-tension glaucoma, bilateral, mild stage: Secondary | ICD-10-CM | POA: Diagnosis not present

## 2017-11-02 ENCOUNTER — Emergency Department (HOSPITAL_COMMUNITY): Payer: Medicare Other

## 2017-11-02 ENCOUNTER — Encounter (HOSPITAL_COMMUNITY): Payer: Self-pay | Admitting: *Deleted

## 2017-11-02 ENCOUNTER — Other Ambulatory Visit: Payer: Self-pay

## 2017-11-02 ENCOUNTER — Emergency Department (HOSPITAL_COMMUNITY)
Admission: EM | Admit: 2017-11-02 | Discharge: 2017-11-03 | Disposition: A | Discharge status: Home / Self Care | Payer: Medicare Other | Attending: Emergency Medicine | Admitting: Emergency Medicine

## 2017-11-02 DIAGNOSIS — S81802D Unspecified open wound, left lower leg, subsequent encounter: Secondary | ICD-10-CM | POA: Diagnosis not present

## 2017-11-02 DIAGNOSIS — S52032A Displaced fracture of olecranon process with intraarticular extension of left ulna, initial encounter for closed fracture: Secondary | ICD-10-CM | POA: Insufficient documentation

## 2017-11-02 DIAGNOSIS — Y9301 Activity, walking, marching and hiking: Secondary | ICD-10-CM | POA: Diagnosis not present

## 2017-11-02 DIAGNOSIS — Y999 Unspecified external cause status: Secondary | ICD-10-CM | POA: Diagnosis not present

## 2017-11-02 DIAGNOSIS — W0110XA Fall on same level from slipping, tripping and stumbling with subsequent striking against unspecified object, initial encounter: Secondary | ICD-10-CM | POA: Diagnosis not present

## 2017-11-02 DIAGNOSIS — Y929 Unspecified place or not applicable: Secondary | ICD-10-CM | POA: Insufficient documentation

## 2017-11-02 DIAGNOSIS — K7469 Other cirrhosis of liver: Secondary | ICD-10-CM | POA: Diagnosis not present

## 2017-11-02 DIAGNOSIS — S52022A Displaced fracture of olecranon process without intraarticular extension of left ulna, initial encounter for closed fracture: Secondary | ICD-10-CM | POA: Diagnosis not present

## 2017-11-02 DIAGNOSIS — M25521 Pain in right elbow: Secondary | ICD-10-CM | POA: Diagnosis not present

## 2017-11-02 DIAGNOSIS — S52202A Unspecified fracture of shaft of left ulna, initial encounter for closed fracture: Secondary | ICD-10-CM | POA: Diagnosis not present

## 2017-11-02 DIAGNOSIS — S81801D Unspecified open wound, right lower leg, subsequent encounter: Secondary | ICD-10-CM | POA: Diagnosis not present

## 2017-11-02 DIAGNOSIS — W19XXXD Unspecified fall, subsequent encounter: Secondary | ICD-10-CM | POA: Diagnosis not present

## 2017-11-02 DIAGNOSIS — M79601 Pain in right arm: Secondary | ICD-10-CM | POA: Diagnosis not present

## 2017-11-02 DIAGNOSIS — I82402 Acute embolism and thrombosis of unspecified deep veins of left lower extremity: Secondary | ICD-10-CM | POA: Diagnosis not present

## 2017-11-02 DIAGNOSIS — Z7901 Long term (current) use of anticoagulants: Secondary | ICD-10-CM | POA: Diagnosis not present

## 2017-11-02 DIAGNOSIS — Z79899 Other long term (current) drug therapy: Secondary | ICD-10-CM | POA: Insufficient documentation

## 2017-11-02 DIAGNOSIS — Z87891 Personal history of nicotine dependence: Secondary | ICD-10-CM | POA: Insufficient documentation

## 2017-11-02 DIAGNOSIS — S59902A Unspecified injury of left elbow, initial encounter: Secondary | ICD-10-CM | POA: Diagnosis present

## 2017-11-02 DIAGNOSIS — I10 Essential (primary) hypertension: Secondary | ICD-10-CM | POA: Insufficient documentation

## 2017-11-02 DIAGNOSIS — M25511 Pain in right shoulder: Secondary | ICD-10-CM | POA: Diagnosis not present

## 2017-11-02 DIAGNOSIS — W19XXXA Unspecified fall, initial encounter: Secondary | ICD-10-CM

## 2017-11-02 MED ORDER — HYDROCODONE-ACETAMINOPHEN 5-325 MG PO TABS
1.0000 | ORAL_TABLET | Freq: Once | ORAL | Status: AC
  Administered 2017-11-02: 1 via ORAL
  Filled 2017-11-02: qty 1

## 2017-11-02 MED ORDER — HYDROCODONE-ACETAMINOPHEN 5-325 MG PO TABS: 1.0000 | ORAL_TABLET | Freq: Four times a day (QID) | ORAL | 0 refills | Status: DC | PRN

## 2017-11-02 NOTE — Discharge Instructions (Addendum)
Please call Dr. Doreatha Martin tomorrow morning to make an appointment to arrange surgery for next week. You were given a prescription for Vicodin.  Do not take this medication when you are driving, operating machinery, or working. Dr. Darl Householder has recommended that you do not take Eliquis prior to your surgery. He has also recommended that you restart Eliquis at 5mg  daily after your surgery because of your history of deep vein thrombosis. Please keep your arm immobilized and in the sling until your are evaluated by orthopedics. Return to the ER if you are experiencing any worsening pain, swelling, numbness or tingling to left upper extremity.

## 2017-11-02 NOTE — ED Triage Notes (Signed)
Pt brought to ED by family member for eval of left broken elbow. Pt states he slipping on slick floor last pm hitting elbow. Denies any further pain or injury. Left elbow is swollen. Per family injury dx via mobile xray. Pt able to move digits on left hand. Left radial pulses palpable.

## 2017-11-02 NOTE — ED Notes (Signed)
Ortho tech called for splint.

## 2017-11-02 NOTE — ED Provider Notes (Signed)
[place] EMERGENCY DEPARTMENT Provider Note   CSN: [id number] Arrival date & time: 11/02/17  1647     History   Chief Complaint Chief Complaint  Patient presents with  . Elbow Injury    HPI Joshua [name] is a 72 y.o. male.  HPI 72 year old male with a h/o cirrhosis, hep c, HTN presenting to the ED today status post fall that occurred last night around 11 PM.  Patient states that he slipped and had an "easy" fall to the ground.  He fell onto his left elbow. Did not fall onto his left hip or left shoulder.  Denies any head trauma, LOC, headache, nausea, vomiting, neck pain, back pain, chest pain, shortness of breath, abdominal pain, or any other symptoms.  Patient currently living at [facility] rehab center. Tetanus UTD.  Patient is not on anticoagulation.  Past Medical History:  Diagnosis Date  . Anemia    low platelets/liver disease  . Arthritis   . Cirrhosis ([place])   . Hepatitis C   . Hypertension   . Wears glasses   . Wears partial dentures    top partial    Patient Active Problem List   Diagnosis Date Noted  . Hypokalemia 09/12/2017  . Goals of care, counseling/discussion   . Palliative care by specialist   . Altered mental status 08/03/2017  . Nonhealing nonsurgical wound 08/03/2017  . AKI (acute kidney injury) ([place]) 08/03/2017  . GERD (gastroesophageal reflux disease) 08/03/2017  . Acute hepatic encephalopathy 07/18/2017  . Hyponatremia 07/18/2017  . Pulmonary nodules 07/18/2017  . Hepatic encephalopathy ([place]) 07/18/2017  . Hyperammonemia ([place]) 05/22/2014  . Acute encephalopathy 05/22/2014  . Renal failure (ARF), acute on chronic ([place]) 05/22/2014  . Cellulitis 02/17/2014  . Cirrhosis ([place]) 02/17/2014  . Hepatitis C 02/17/2014  . HTN (hypertension) 02/17/2014    Past Surgical History:  Procedure Laterality Date  . APPENDECTOMY    . COLONOSCOPY    . EYE SURGERY     both cataracts  . PILONIDAL CYST EXCISION  1960  . SHOULDER  ARTHROSCOPY WITH SUBACROMIAL DECOMPRESSION, ROTATOR CUFF REPAIR AND BICEP TENDON REPAIR Left 08/09/2013   Procedure: LEFT SHOULDER ARTHROSCOPY WITH SUBACROMIAL DECOMPRESSION, DISTAL CLAVICULECTOMY;  Surgeon: Ninetta Lights, MD;  Location: [place];  Service: Orthopedics;  Laterality: Left;  . TONSILLECTOMY    . UPPER GI ENDOSCOPY         Home Medications    Prior to Admission medications   Medication Sig Start Date End Date Taking? Authorizing Provider  amphetamine-dextroamphetamine (ADDERALL) 10 MG tablet Take 20 mg by mouth daily.  06/10/12   [provider]  bisacodyl (DULCOLAX) 10 MG suppository Place 10 mg as needed rectally for moderate constipation.    [provider]  bisacodyl (DULCOLAX) 5 MG EC tablet Take 5 mg by mouth daily as needed for moderate constipation.    [provider]  docusate sodium (COLACE) 100 MG capsule Take 100 mg 2 (two) times daily by mouth.    [provider]  ELIQUIS STARTER PACK (ELIQUIS STARTER PACK) 5 MG TABS Take as directed on package: start with two-5mg  tablets twice daily for 7 days. On day 8, switch to one-5mg  tablet twice daily. 10/17/17   Drenda Freeze, MD  furosemide (LASIX) 40 MG tablet Take 1 tablet (40 mg total) daily by mouth. 09/14/17   Rai, Ripudeep K, MD  HYDROcodone-acetaminophen (NORCO/VICODIN) 5-325 MG tablet Take 1 tablet by mouth every 6 (six) hours as needed (Take  one tablet every 6 hours for pain). 11/02/17   Jaecion Dempster S, PA-C  hydrocortisone cream 1 % Apply 1 application 2 (two) times daily as needed topically for itching. For back    [provider]  hydrOXYzine (ATARAX/VISTARIL) 25 MG tablet Take 25 mg every 6 (six) hours as needed by mouth for itching.    [provider]  lactulose (CHRONULAC) 10 GM/15ML solution Take 45 mLs (30 g total) 4 (four) times daily by mouth. Titrate to have 3-4 BM's in a day 09/13/17   Rai, Ripudeep K, MD  Magnesium Hydroxide  (MILK OF MAGNESIA CONCENTRATE PO) Take 30 mLs daily as needed by mouth (constipation).    [provider]  Melatonin 5 MG TABS Take 5 mg at bedtime by mouth.    [provider]  Multiple Vitamins-Minerals (MULTIVITAMIN WITH MINERALS) tablet Take 1 tablet by mouth daily.    [provider]  pantoprazole (PROTONIX) 40 MG tablet Take 40 mg by mouth daily. 06/28/17   [provider]  Polyethyl Glycol-Propyl Glycol (SYSTANE OP) Place 1 drop into both eyes at bedtime.    [provider]  rifaximin (XIFAXAN) 200 MG tablet Take 2 tablets (400 mg total) by mouth 3 (three) times daily. 08/07/17   Gherghe, Vella Redhead, MD  SODIUM FLUORIDE, DENTAL RINSE, (PREVIDENT) 0.2 % SOLN Place 1 mL onto teeth See admin instructions. Rinse with small amount for one minute and then spit; do not eat, drink or rinse for 30 minutes after using     [provider]  spironolactone (ALDACTONE) 100 MG tablet Take 1 tablet (100 mg total) by mouth daily. Patient taking differently: Take 100 mg 2 (two) times daily by mouth.  08/07/17   Gherghe, Vella Redhead, MD  zolpidem (AMBIEN) 5 MG tablet Take 5 mg at bedtime as needed by mouth for sleep.    [provider]    Family History Family History  Problem Relation Age of Onset  . Cirrhosis Mother   . Cirrhosis Father   . CAD Neg Hx     Social History Social History   Tobacco Use  . Smoking status: Former Smoker    Packs/day: 1.00    Years: 45.00    Pack years: 45.00    Types: Cigarettes  . Smokeless tobacco: Never Used  Substance Use Topics  . Alcohol use: Yes    Comment: "one beer a week"  . Drug use: Yes    Types: Marijuana     Allergies   Codeine   Review of Systems Review of Systems  Constitutional: Negative for chills and fever.  HENT: Negative for ear pain and sore throat.   Eyes: Negative for pain and visual disturbance.  Respiratory: Negative for cough and shortness of breath.   Cardiovascular:  Negative for chest pain and palpitations.  Gastrointestinal: Negative for abdominal pain and vomiting.  Genitourinary: Negative for dysuria and hematuria.  Musculoskeletal: Negative for arthralgias and back pain.       Left elbow pain  Skin: Negative for color change and rash.  Neurological: Negative for seizures and syncope.  All other systems reviewed and are negative.    Physical Exam Updated Vital Signs BP (!) 146/77 (BP Location: Right Arm)   Pulse 85   Temp 97.7 F (36.5 C) (Oral)   Resp 16   SpO2 99%   Physical Exam  Constitutional: He is oriented to person, place, and time. He appears well-developed and well-nourished. No distress.  HENT:  Head: Normocephalic  and atraumatic.  Right Ear: External ear normal.  Left Ear: External ear normal.  Nose: Nose normal.  Mouth/Throat: Oropharynx is clear and moist.  No battle signs, no raccoons eyes, no rhinorrhea, no hemotympanum. No tenderness to palpation of the skull or face. No deformity or crepitus noted.  Eyes: Conjunctivae and EOM are normal. Pupils are equal, round, and reactive to light.  Neck: Normal range of motion. Neck supple.  Cardiovascular: Normal rate and regular rhythm.  No murmur heard. Pulmonary/Chest: Effort normal and breath sounds normal. No respiratory distress.  Abdominal: Soft. There is no tenderness.  Musculoskeletal:  TTP to the left elbow with swelling and ecchymosis. Pt able to flex elbow to 45 degrees, able to extend to almost to 180 degrees. No abrasion or laceration. No TTP to the cervical, thoracic, or lumbar spine. No pain to the paraspinous muscles. 5/5 strength to RUE and BLE. Normal grip strength bilaterally. No bony TTP along bilat shoulders or clavicles. Bilat lower extremities with chronic edema. currently wrapped and bandaged. Brisk cap refill and sensation intact to toes.  Neurological: He is alert and oriented to person, place, and time.  Mental Status:  Alert, thought content  appropriate, able to give a coherent history. Speech fluent without evidence of aphasia. Able to follow 2 step commands without difficulty.  Cranial Nerves:  II:  pupils equal, round, reactive to light III,IV, VI: ptosis not present, extra-ocular motions intact bilaterally  V,VII: smile symmetric, facial light touch sensation equal VIII: hearing grossly normal to voice  X: uvula elevates symmetrically  XI: bilateral shoulder shrug symmetric and strong XII: midline tongue extension without fassiculations Sensory: light touch normal in all extremities. CV: 2+ radial pulses. DP/PT pulses unable to be palpated due to bilat lower extremities being wrapped and bandaged.  Skin: Skin is warm and dry.  Psychiatric: He has a normal mood and affect.  Nursing note and vitals reviewed.    ED Treatments / Results  Labs (all labs ordered are listed, but only abnormal results are displayed) Labs Reviewed - No data to display  EKG  EKG Interpretation None       Radiology Dg Elbow Complete Left  Result Date: 11/02/2017 CLINICAL DATA:  Left elbow pain post fall. EXAM: LEFT ELBOW - COMPLETE 3+ VIEW COMPARISON:  None. FINDINGS: Generalized osteopenia. Severely distracted fracture of the olecranon with 18 mm separation of the fracture fragments. Associated joint effusion and posterior soft tissue swelling/hematoma. IMPRESSION: Severe destructed intra-articular fracture of the olecranon with joint effusion and soft tissue swelling. Electronically Signed   By: Fidela Salisbury M.D.   On: 11/02/2017 19:40    Procedures Procedures (including critical care time)  Medications Ordered in ED Medications  HYDROcodone-acetaminophen (NORCO/VICODIN) 5-325 MG per tablet 1 tablet (1 tablet Oral Given 11/02/17 8338)   SPLINT APPLICATION Date/Time: 2:50 AM Authorized by: Rodney Booze Consent: Verbal consent obtained. Risks and benefits: risks, benefits and alternatives were discussed Consent given by:  patient Splint applied by: orthopedic technician Location details: left upper extremity Splint type: posterior long arm splint and sling immobilizer Post-procedure: The splinted body part was neurovascularly unchanged following the procedure. Patient tolerance: Patient tolerated the procedure well with no immediate complications.   Initial Impression / Assessment and Plan / ED Course  I have reviewed the triage vital signs and the nursing notes.  Pertinent labs & imaging results that were available during my care of the patient were reviewed by me and considered in my medical decision making (see chart  for details).    Staffed patient with Dr. Darl Householder who agrees with the plan to consult orthopedics. He does not feel that a head and neck CT are necessary.   Dr. Darl Householder discussed patient case with Dr. Doreatha Martin who recommended surgery.  Stated that the patient could either be admitted overnight and have surgery in the morning, or follow-up with the office and schedule an appointment within the next few days.  Patient and family would like to wait and schedule surgery at a later date.  Patient should call the office tomorrow to schedule an appointment for this.    Dr. Darl Householder also stated that patient is not on Eliquis according to his MAR from the rehab center.  He saw the patient for a DVT in October 2018 and initiated a starter pack of Eliquis.  He had advised continuation of Eliquis 5 mg daily after completion of the starter pack.  He is recommending that patient continue to not take Eliquis until he has surgery, however he recommends that the patient restart Eliquis 5 mg daily after surgery is completed.  This information was relayed to the patient and his sister and was included in the discharge instructions.  Discussed the results of the x-ray with the patient and his sister and informed them of the plan for surgery.  Advised him to call Dr. Doreatha Martin tomorrow morning to schedule surgery.  Discussed the plan for  splint, sling and discharge with pain medication.  Advised patient not to work, drive, operate machinery while taking pain medication.  Advised him to return to the ER if he experiences any tingling,  numbness, or cyanosis to the fingers.  Further advised to return for any increasing pain swelling or any new concerns.  Patient and his sister understand the plan and agree.  Final Clinical Impressions(s) / ED Diagnoses   Final diagnoses:  Closed displaced intra-articular fracture of olecranon process of left ulna, initial encounter  Fall, initial encounter   Patient presenting after fall with closed displaced intra-articular fracture of the left olecranon process.  Patient is neurovascularly intact with normal sensation and grip strength.  Splint and immobilization sling were applied.  Pain medications given.  Dr. Doreatha Martin, Orthopedics was consulted and recommended surgical intervention.  Patient was advised to follow-up tomorrow for scheduling.   No other injuries from the fall that require imaging. Return precautions given.  ED Discharge Orders        Ordered    HYDROcodone-acetaminophen (NORCO/VICODIN) 5-325 MG tablet  Every 6 hours PRN     11/02/17 2330       Rodney Booze, PA-C 11/03/17 0413    Drenda Freeze, MD 11/04/17 1051

## 2017-11-03 DIAGNOSIS — S52202A Unspecified fracture of shaft of left ulna, initial encounter for closed fracture: Secondary | ICD-10-CM | POA: Diagnosis not present

## 2017-11-03 DIAGNOSIS — I82402 Acute embolism and thrombosis of unspecified deep veins of left lower extremity: Secondary | ICD-10-CM | POA: Diagnosis not present

## 2017-11-03 DIAGNOSIS — B182 Chronic viral hepatitis C: Secondary | ICD-10-CM | POA: Diagnosis not present

## 2017-11-03 DIAGNOSIS — K7469 Other cirrhosis of liver: Secondary | ICD-10-CM | POA: Diagnosis not present

## 2017-11-03 NOTE — Progress Notes (Signed)
Orthopedic Tech Progress Note Patient Details:  Joshua [name] Mar 11, 1946 [id number]  Ortho Devices Type of Ortho Device: Post (long arm) splint, Sling immobilizer Ortho Device/Splint Location: lue Ortho Device/Splint Interventions: Application, Adjustment, Ordered   Post Interventions Patient Tolerated: Well Instructions Provided: Care of device, Adjustment of device   Karolee Stamps 11/03/2017, 12:31 AM

## 2017-11-03 NOTE — ED Notes (Signed)
Ortho tech coming to apply splint

## 2017-11-04 DIAGNOSIS — S42402A Unspecified fracture of lower end of left humerus, initial encounter for closed fracture: Secondary | ICD-10-CM | POA: Diagnosis not present

## 2017-11-04 DIAGNOSIS — W19XXXA Unspecified fall, initial encounter: Secondary | ICD-10-CM | POA: Diagnosis not present

## 2017-11-04 DIAGNOSIS — K7460 Unspecified cirrhosis of liver: Secondary | ICD-10-CM | POA: Diagnosis not present

## 2017-11-04 DIAGNOSIS — I890 Lymphedema, not elsewhere classified: Secondary | ICD-10-CM | POA: Diagnosis not present

## 2017-11-08 DIAGNOSIS — S52022A Displaced fracture of olecranon process without intraarticular extension of left ulna, initial encounter for closed fracture: Secondary | ICD-10-CM | POA: Diagnosis not present

## 2017-11-09 DIAGNOSIS — S81801D Unspecified open wound, right lower leg, subsequent encounter: Secondary | ICD-10-CM | POA: Diagnosis not present

## 2017-11-09 DIAGNOSIS — S81802D Unspecified open wound, left lower leg, subsequent encounter: Secondary | ICD-10-CM | POA: Diagnosis not present

## 2017-11-10 ENCOUNTER — Encounter (HOSPITAL_COMMUNITY): Payer: Self-pay

## 2017-11-10 ENCOUNTER — Other Ambulatory Visit: Payer: Self-pay

## 2017-11-10 ENCOUNTER — Encounter (HOSPITAL_COMMUNITY)
Admission: RE | Admit: 2017-11-10 | Discharge: 2017-11-10 | Disposition: A | Discharge status: Home / Self Care | Payer: Medicare Other | Source: Ambulatory Visit | Attending: Student | Admitting: Student

## 2017-11-10 ENCOUNTER — Other Ambulatory Visit (HOSPITAL_COMMUNITY): Payer: Self-pay | Admitting: *Deleted

## 2017-11-10 DIAGNOSIS — B1920 Unspecified viral hepatitis C without hepatic coma: Secondary | ICD-10-CM | POA: Diagnosis not present

## 2017-11-10 DIAGNOSIS — S52022A Displaced fracture of olecranon process without intraarticular extension of left ulna, initial encounter for closed fracture: Secondary | ICD-10-CM | POA: Diagnosis not present

## 2017-11-10 DIAGNOSIS — I739 Peripheral vascular disease, unspecified: Secondary | ICD-10-CM | POA: Diagnosis not present

## 2017-11-10 DIAGNOSIS — Y939 Activity, unspecified: Secondary | ICD-10-CM | POA: Diagnosis not present

## 2017-11-10 DIAGNOSIS — Z87891 Personal history of nicotine dependence: Secondary | ICD-10-CM | POA: Diagnosis not present

## 2017-11-10 DIAGNOSIS — Z79899 Other long term (current) drug therapy: Secondary | ICD-10-CM | POA: Diagnosis not present

## 2017-11-10 DIAGNOSIS — K219 Gastro-esophageal reflux disease without esophagitis: Secondary | ICD-10-CM | POA: Diagnosis not present

## 2017-11-10 DIAGNOSIS — G47419 Narcolepsy without cataplexy: Secondary | ICD-10-CM | POA: Diagnosis not present

## 2017-11-10 DIAGNOSIS — D696 Thrombocytopenia, unspecified: Secondary | ICD-10-CM | POA: Diagnosis not present

## 2017-11-10 DIAGNOSIS — Z885 Allergy status to narcotic agent status: Secondary | ICD-10-CM | POA: Diagnosis not present

## 2017-11-10 DIAGNOSIS — Z7901 Long term (current) use of anticoagulants: Secondary | ICD-10-CM | POA: Diagnosis not present

## 2017-11-10 DIAGNOSIS — W19XXXA Unspecified fall, initial encounter: Secondary | ICD-10-CM | POA: Diagnosis not present

## 2017-11-10 DIAGNOSIS — M6289 Other specified disorders of muscle: Secondary | ICD-10-CM | POA: Diagnosis not present

## 2017-11-10 DIAGNOSIS — K7460 Unspecified cirrhosis of liver: Secondary | ICD-10-CM | POA: Diagnosis not present

## 2017-11-10 DIAGNOSIS — G9340 Encephalopathy, unspecified: Secondary | ICD-10-CM | POA: Diagnosis not present

## 2017-11-10 DIAGNOSIS — Z86718 Personal history of other venous thrombosis and embolism: Secondary | ICD-10-CM | POA: Diagnosis not present

## 2017-11-10 DIAGNOSIS — Z8379 Family history of other diseases of the digestive system: Secondary | ICD-10-CM | POA: Diagnosis not present

## 2017-11-10 DIAGNOSIS — I10 Essential (primary) hypertension: Secondary | ICD-10-CM | POA: Diagnosis not present

## 2017-11-10 HISTORY — DX: Peripheral vascular disease, unspecified: I73.9

## 2017-11-10 HISTORY — DX: Gastro-esophageal reflux disease without esophagitis: K21.9

## 2017-11-10 HISTORY — DX: Narcolepsy without cataplexy: G47.419

## 2017-11-10 HISTORY — DX: Other specified abnormal findings of blood chemistry: R79.89

## 2017-11-10 LAB — CBC WITH DIFFERENTIAL/PLATELET
Basophils Absolute: 0.1 10*3/uL (ref 0.0–0.1)
Basophils Relative: 1 %
Eosinophils Absolute: 0.1 10*3/uL (ref 0.0–0.7)
Eosinophils Relative: 2 %
HEMATOCRIT: 34.5 % — AB (ref 39.0–52.0)
HEMOGLOBIN: 11.5 g/dL — AB (ref 13.0–17.0)
LYMPHS PCT: 19 %
Lymphs Abs: 1.4 10*3/uL (ref 0.7–4.0)
MCH: 29.5 pg (ref 26.0–34.0)
MCHC: 33.3 g/dL (ref 30.0–36.0)
MCV: 88.5 fL (ref 78.0–100.0)
MONO ABS: 0.7 10*3/uL (ref 0.1–1.0)
MONOS PCT: 10 %
NEUTROS ABS: 4.7 10*3/uL (ref 1.7–7.7)
NEUTROS PCT: 68 %
Platelets: 120 10*3/uL — ABNORMAL LOW (ref 150–400)
RBC: 3.9 MIL/uL — ABNORMAL LOW (ref 4.22–5.81)
RDW: 15.6 % — AB (ref 11.5–15.5)
WBC: 7 10*3/uL (ref 4.0–10.5)

## 2017-11-10 LAB — COMPREHENSIVE METABOLIC PANEL
ALK PHOS: 90 U/L (ref 38–126)
ALT: 32 U/L (ref 17–63)
AST: 45 U/L — AB (ref 15–41)
Albumin: 2.8 g/dL — ABNORMAL LOW (ref 3.5–5.0)
Anion gap: 11 (ref 5–15)
BILIRUBIN TOTAL: 2.1 mg/dL — AB (ref 0.3–1.2)
BUN: 15 mg/dL (ref 6–20)
CALCIUM: 8.3 mg/dL — AB (ref 8.9–10.3)
CO2: 21 mmol/L — ABNORMAL LOW (ref 22–32)
CREATININE: 1.06 mg/dL (ref 0.61–1.24)
Chloride: 103 mmol/L (ref 101–111)
GFR calc Af Amer: >60 mL/min (ref >60)
GLUCOSE: 99 mg/dL (ref 65–99)
POTASSIUM: 4.2 mmol/L (ref 3.5–5.1)
Sodium: 135 mmol/L (ref 135–145)
TOTAL PROTEIN: 5.9 g/dL — AB (ref 6.5–8.1)

## 2017-11-10 LAB — PROTIME-INR
INR: 1.29
PROTHROMBIN TIME: 15.9 s — AB (ref 11.4–15.2)

## 2017-11-10 NOTE — Progress Notes (Signed)
Anesthesia Chart Review:  Pt is a 72 year old male scheduled for ORIF L elbow/olecranon fracture on 11/11/2017 with Katha Hamming, MD  - PCP is [name], MD  PMH includes:  HTN, liver cirrhosis, hepatitis C (s/p Harvoni tx), L DVT (current, dx 09/2017), thrombocytopenia, narcolepsy, GERD.  Former smoker.  BMI 32.  S/p L shoulder arthroscopy 08/09/13  - Hospitalized 10/10-10/14/18 for acute hepatic encephalopathy, hyperammonemia, acute kidney injury  Medications include: Adderall, Eliquis, Lasix, lactulose, Protonix, rifaximin, spironolactone. Pt was given 5 day eliquis starter pack for DVT 10/17/17 but has not taken med since. Will need acute DVT treatment reinitiated post-op  BP 139/62   Pulse 77   Temp 36.7 C   Resp 18   Ht '5\' 5"'  (1.651 m)   Wt 191 lb 14.4 oz (87 kg)   SpO2 100%   BMI 31.93 kg/m   Preoperative labs reviewed.   - PT 15.9 - AST 45; ALT and alk phos normal - Platelets 120  CXR 07/18/17:  1. Shallow lung inflation with mild left basilar atelectasis and/or scarring. No other active cardiopulmonary disease. 2. Two adjacent approximate 2 cm nodular densities overlying the peripheral right lower lung. While one and/or both of these are favored to reflect summation of overlying osseous shadows, underlying pulmonary nodules are not entirely excluded. Follow-up examination with dedicated cross-sectional imaging of the chest recommended for further characterization. 3. Underlying emphysema.  CT chest 08/01/17:  - No suspicious pulmonary nodules in the lungs as questioned on prior chest x-ray. - Bibasilar scarring. - Coronary artery disease, aortic atherosclerosis. - Distal esophageal wall thickening may reflect esophagitis. - Cirrhosis with associated upper abdominal varices, esophageal varices, and splenomegaly. - Cholelithiasis.  EKG 10/17/17:  - Sinus rhythm with 1st degree A-V block with occasional Premature ventricular complexes - Prolonged QT  CT angio  chest 10/17/17:  1. No evidence of pulmonary embolism or other acute pulmonary process. 2. Coronary atherosclerosis with mild calcified plaque suspected in the distribution of the LAD and left circumflex coronary arteries. 3. Cirrhotic liver splenomegaly.  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  If no changes, I anticipate pt can proceed with surgery as scheduled.   Willeen Cass, FNP-BC [facility] Short Stay Surgical Center/Anesthesiology Phone: [phone] 11/10/2017 4:03 PM

## 2017-11-10 NOTE — Pre-Procedure Instructions (Signed)
Joshua [name]  11/10/2017    Your procedure is scheduled on Friday, November 11, 2017 at 8:00 AM.   Report to [facility] Entrance "A" Admitting Office at 6:00 AM.   Call this number if you have problems the morning of surgery: [phone]   Remember:  Do not eat food or drink liquids after midnight tonight.  Take these medicines the morning of surgery with A SIP OF WATER: Pantoprazole (Protonix), Hydrocodone - if needed  Stop Multivitamins as of today. Do not use NSAIDS (Ibuprofen, Aleve, etc) and Aspirin products prior to surgery.   Do not wear jewelry.  Do not wear lotions, powders, cologne or deodorant.  Men may shave face and neck.  Do not bring valuables to the hospital.  [facility] is not responsible for any belongings or valuables.  Contacts, dentures or bridgework may not be worn into surgery.  Leave your suitcase in the car.  After surgery it may be brought to your room.  For patients admitted to the hospital, discharge time will be determined by your treatment team.  Patients discharged the day of surgery will not be allowed to drive home.   [facility] - Preparing for Surgery  Before surgery, you can play an important role.  Because skin is not sterile, your skin needs to be as free of germs as possible.  You can reduce the number of germs on you skin by washing with CHG (chlorahexidine gluconate) soap before surgery.  CHG is an antiseptic cleaner which kills germs and bonds with the skin to continue killing germs even after washing.  Please DO NOT use if you have an allergy to CHG or antibacterial soaps.  If your skin becomes reddened/irritated stop using the CHG and inform your nurse when you arrive at Short Stay.  Do not shave (including legs and underarms) for at least 48 hours prior to the first CHG shower.  You may shave your face.  Please follow these instructions carefully:   1.  Shower with CHG Soap the night before surgery and the                     morning of Surgery.  2.  If you choose to wash your hair, wash your hair first as usual with your       normal shampoo.  3.  After you shampoo, rinse your hair and body thoroughly to remove the shampoo.  4.  Use CHG as you would any other liquid soap.  You can apply chg directly       to the skin and wash gently with scrungie or a clean washcloth.  5.  Apply the CHG Soap to your body ONLY FROM THE NECK DOWN.        Do not use on open wounds or open sores.  Avoid contact with your eyes, ears, mouth and genitals (private parts).  Wash genitals (private parts) with your normal soap.  6.  Wash thoroughly, paying special attention to the area where your surgery        will be performed.  7.  Thoroughly rinse your body with warm water from the neck down.  8.  DO NOT shower/wash with your normal soap after using and rinsing off       the CHG Soap.  9.  Pat yourself dry with a clean towel.            10.  Wear clean pajamas.  11.  Place clean sheets on your bed the night of your first shower and do not        sleep with pets.  Day of Surgery  Shower as above. Do not apply any lotions/deoderants the morning of surgery.  Please wear clean clothes to the hospital.   Please Joshua over the fact sheets that you were given.

## 2017-11-10 NOTE — H&P (Signed)
[facility] (OTS) H&P  Patient ID: Joshua [name] MRN: [id number] DOB/AGE: Aug 16, 1946 72 y.o.  Reason for Consult: Left olecranon fracture  HPI: Joshua [name] is an 72 y.o. male with a history of hepatitis C and cirrhosis who had a fall with a displaced olecranon fracture.  He came to the emergency room and was sent home with outpatient follow-up.  I saw him earlier this week.  He was in a splint in his well.  He has a history of DVT and is currently holding his anticoagulation.  He denies any other issues.  He has had multiple hospitalizations recently for elevated ammonia levels due to his cirrhosis of his liver from his hepatitis C.  He is without complaints right now.  His sister provides a significant amount of medical history for him and is a significant caretaker.  He currently resides at an assisted living facility.  Past Medical History:  Diagnosis Date  . Anemia    low platelets/liver disease  . Arthritis   . Cirrhosis ([place])   . GERD (gastroesophageal reflux disease)   . Hepatitis C    has been treated with Harvoni  . Hypertension   . Increased ammonia level   . Narcolepsy   . Peripheral vascular disease ([place])    DVT on left side  . Wears glasses   . Wears partial dentures    top partial    Past Surgical History:  Procedure Laterality Date  . APPENDECTOMY    . COLONOSCOPY    . EYE SURGERY     both cataracts  . PILONIDAL CYST EXCISION  1960  . SHOULDER ARTHROSCOPY WITH SUBACROMIAL DECOMPRESSION, ROTATOR CUFF REPAIR AND BICEP TENDON REPAIR Left 08/09/2013   Procedure: LEFT SHOULDER ARTHROSCOPY WITH SUBACROMIAL DECOMPRESSION, DISTAL CLAVICULECTOMY;  Surgeon: Ninetta Lights, MD;  Location: [facility];  Service: Orthopedics;  Laterality: Left;  . TONSILLECTOMY    . UPPER GI ENDOSCOPY      Family History  Problem Relation Age of Onset  . Cirrhosis Mother   . Cirrhosis Father   . CAD Neg Hx     Social History:  reports that he has  quit smoking. His smoking use included cigarettes. He has a 45.00 pack-year smoking history. he has never used smokeless tobacco. He reports that he drinks alcohol. He reports that he uses drugs. Drug: Marijuana.  Allergies:  Allergies  Allergen Reactions  . Codeine Itching    Medications:  No current facility-administered medications on file prior to encounter.    Current Outpatient Medications on File Prior to Encounter  Medication Sig Dispense Refill  . amphetamine-dextroamphetamine (ADDERALL) 10 MG tablet Take 20 mg by mouth daily.     . bisacodyl (DULCOLAX) 10 MG suppository Place 10 mg as needed rectally for moderate constipation.    . bisacodyl (DULCOLAX) 5 MG EC tablet Take 5 mg by mouth daily as needed for moderate constipation.    . docusate sodium (COLACE) 100 MG capsule Take 100 mg 2 (two) times daily by mouth.    Arne [place] STARTER PACK (ELIQUIS STARTER PACK) 5 MG TABS Take as directed on package: start with two-5mg  tablets twice daily for 7 days. On day 8, switch to one-5mg  tablet twice daily. 1 each 0  . furosemide (LASIX) 40 MG tablet Take 1 tablet (40 mg total) daily by mouth. 30 tablet 1  . HYDROcodone-acetaminophen (NORCO/VICODIN) 5-325 MG tablet Take 1 tablet by mouth every 6 (six) hours as needed (Take one tablet every  6 hours for pain). 6 tablet 0  . hydrocortisone cream 1 % Apply 1 application 2 (two) times daily as needed topically for itching. For back    . hydrOXYzine (ATARAX/VISTARIL) 25 MG tablet Take 25 mg every 6 (six) hours as needed by mouth for itching.    . lactulose (CHRONULAC) 10 GM/15ML solution Take 45 mLs (30 g total) 4 (four) times daily by mouth. Titrate to have 3-4 BM's in a day 240 mL 3  . Magnesium Hydroxide (MILK OF MAGNESIA CONCENTRATE PO) Take 30 mLs daily as needed by mouth (constipation).    . Melatonin 5 MG TABS Take 5 mg at bedtime by mouth.    . Multiple Vitamins-Minerals (MULTIVITAMIN WITH MINERALS) tablet Take 1 tablet by mouth daily.     . pantoprazole (PROTONIX) 40 MG tablet Take 40 mg by mouth daily.  3  . Polyethyl Glycol-Propyl Glycol (SYSTANE OP) Place 1 drop into both eyes at bedtime.    . rifaximin (XIFAXAN) 200 MG tablet Take 2 tablets (400 mg total) by mouth 3 (three) times daily.    . SODIUM FLUORIDE, DENTAL RINSE, (PREVIDENT) 0.2 % SOLN Place 1 mL onto teeth See admin instructions. Rinse with small amount for one minute and then spit; do not eat, drink or rinse for 30 minutes after using     . spironolactone (ALDACTONE) 100 MG tablet Take 1 tablet (100 mg total) by mouth daily. (Patient taking differently: Take 100 mg 2 (two) times daily by mouth. )    . zolpidem (AMBIEN) 5 MG tablet Take 5 mg at bedtime as needed by mouth for sleep.      ROS: Constitutional: No fever or chills Vision: No changes in vision ENT: No difficulty swallowing CV: No chest pain Pulm: No SOB or wheezing GI: No nausea or vomiting GU: No urgency or inability to hold urine Skin: No poor wound healing Neurologic: No numbness or tingling Psychiatric: No depression or anxiety Heme: No bruising Allergic: No reaction to medications or food   Exam: There were no vitals taken for this visit. General: No acute distress Orientation: Awake alert and oriented x3 Mood and Affect: Cooperative and pleasant Gait: Normal Coordination and balance: Normal  Left upper extremity: Splint taken down shows multiple bruising and swelling about the elbow.  He is able to use gravity to assist in extension but he has very weak triceps extension and is unable to do this eliminating gravity.  He has a palpable defect at the location of this fracture.  He is neurovascular otherwise intact.  Right upper extremity:Skin without lesions. No tenderness to palpation. Full painless ROM, full strength in each muscle groups without evidence of instability.   Medical Decision Making: Imaging: Left elbow images show a displaced olecranon fracture  Labs:  CBC     Component Value Date/Time   WBC 7.0 11/10/2017 1400   RBC 3.90 (L) 11/10/2017 1400   HGB 11.5 (L) 11/10/2017 1400   HGB 14.9 01/27/2010 1427   HCT 34.5 (L) 11/10/2017 1400   HCT 41.8 01/27/2010 1427   PLT 120 (L) 11/10/2017 1400   PLT 53 (L) 01/27/2010 1427   MCV 88.5 11/10/2017 1400   MCV 98.8 (H) 01/27/2010 1427   MCH 29.5 11/10/2017 1400   MCHC 33.3 11/10/2017 1400   RDW 15.6 (H) 11/10/2017 1400   RDW 14.0 01/27/2010 1427   LYMPHSABS 1.4 11/10/2017 1400   LYMPHSABS 1.1 01/27/2010 1427   MONOABS 0.7 11/10/2017 1400   MONOABS 0.3 01/27/2010 1427  EOSABS 0.1 11/10/2017 1400   EOSABS 0.1 01/27/2010 1427   BASOSABS 0.1 11/10/2017 1400   BASOSABS 0.0 01/27/2010 1427     Medical history and chart was reviewed  Assessment/Plan: 72 year old left-hand-dominant male with a history hepatitis C and cirrhosis with a displaced olecranon fracture  I discussed treatment options with him.  Feel that nonoperative treatment could be considered but it would limit his strength especially in extension.  He may need a walker in the near future and would need to the strength of his arms to assist with ambulation.  Risks and benefits were discussed and they wish to proceed with open reduction internal fixation.  Will likely admit for overnight observation.   Shona Needles, MD [facility] [phone] (phone)

## 2017-11-11 ENCOUNTER — Ambulatory Visit (HOSPITAL_COMMUNITY): Payer: Medicare Other

## 2017-11-11 ENCOUNTER — Ambulatory Visit (HOSPITAL_COMMUNITY): Payer: Medicare Other | Admitting: Emergency Medicine

## 2017-11-11 ENCOUNTER — Observation Stay (HOSPITAL_COMMUNITY)
Admission: RE | Admit: 2017-11-11 | Discharge: 2017-11-12 | Disposition: A | Discharge status: Home / Self Care | Payer: Medicare Other | Source: Ambulatory Visit | Attending: Student | Admitting: Student

## 2017-11-11 ENCOUNTER — Ambulatory Visit (HOSPITAL_COMMUNITY): Payer: Medicare Other | Admitting: Certified Registered Nurse Anesthetist

## 2017-11-11 ENCOUNTER — Encounter (HOSPITAL_COMMUNITY): Payer: Self-pay

## 2017-11-11 ENCOUNTER — Encounter (HOSPITAL_COMMUNITY)
Admission: RE | Disposition: A | Discharge status: Home / Self Care | Payer: Self-pay | Source: Ambulatory Visit | Attending: Student

## 2017-11-11 ENCOUNTER — Observation Stay (HOSPITAL_COMMUNITY): Payer: Medicare Other

## 2017-11-11 DIAGNOSIS — Z86718 Personal history of other venous thrombosis and embolism: Secondary | ICD-10-CM | POA: Diagnosis not present

## 2017-11-11 DIAGNOSIS — D696 Thrombocytopenia, unspecified: Secondary | ICD-10-CM | POA: Insufficient documentation

## 2017-11-11 DIAGNOSIS — G47419 Narcolepsy without cataplexy: Secondary | ICD-10-CM | POA: Diagnosis not present

## 2017-11-11 DIAGNOSIS — S52032A Displaced fracture of olecranon process with intraarticular extension of left ulna, initial encounter for closed fracture: Secondary | ICD-10-CM | POA: Diagnosis not present

## 2017-11-11 DIAGNOSIS — K7460 Unspecified cirrhosis of liver: Secondary | ICD-10-CM | POA: Diagnosis present

## 2017-11-11 DIAGNOSIS — Z8379 Family history of other diseases of the digestive system: Secondary | ICD-10-CM | POA: Diagnosis not present

## 2017-11-11 DIAGNOSIS — T148XXA Other injury of unspecified body region, initial encounter: Secondary | ICD-10-CM

## 2017-11-11 DIAGNOSIS — G9340 Encephalopathy, unspecified: Secondary | ICD-10-CM | POA: Diagnosis not present

## 2017-11-11 DIAGNOSIS — I10 Essential (primary) hypertension: Secondary | ICD-10-CM | POA: Diagnosis not present

## 2017-11-11 DIAGNOSIS — I739 Peripheral vascular disease, unspecified: Secondary | ICD-10-CM | POA: Diagnosis not present

## 2017-11-11 DIAGNOSIS — S52022A Displaced fracture of olecranon process without intraarticular extension of left ulna, initial encounter for closed fracture: Secondary | ICD-10-CM | POA: Diagnosis not present

## 2017-11-11 DIAGNOSIS — B1920 Unspecified viral hepatitis C without hepatic coma: Secondary | ICD-10-CM | POA: Diagnosis not present

## 2017-11-11 DIAGNOSIS — Z79899 Other long term (current) drug therapy: Secondary | ICD-10-CM | POA: Diagnosis not present

## 2017-11-11 DIAGNOSIS — W19XXXA Unspecified fall, initial encounter: Secondary | ICD-10-CM | POA: Insufficient documentation

## 2017-11-11 DIAGNOSIS — Z885 Allergy status to narcotic agent status: Secondary | ICD-10-CM | POA: Insufficient documentation

## 2017-11-11 DIAGNOSIS — Y939 Activity, unspecified: Secondary | ICD-10-CM | POA: Insufficient documentation

## 2017-11-11 DIAGNOSIS — Z7901 Long term (current) use of anticoagulants: Secondary | ICD-10-CM | POA: Insufficient documentation

## 2017-11-11 DIAGNOSIS — K219 Gastro-esophageal reflux disease without esophagitis: Secondary | ICD-10-CM | POA: Diagnosis not present

## 2017-11-11 DIAGNOSIS — M6289 Other specified disorders of muscle: Secondary | ICD-10-CM | POA: Insufficient documentation

## 2017-11-11 DIAGNOSIS — Z87891 Personal history of nicotine dependence: Secondary | ICD-10-CM | POA: Insufficient documentation

## 2017-11-11 DIAGNOSIS — S52002D Unspecified fracture of upper end of left ulna, subsequent encounter for closed fracture with routine healing: Secondary | ICD-10-CM | POA: Diagnosis not present

## 2017-11-11 DIAGNOSIS — G8918 Other acute postprocedural pain: Secondary | ICD-10-CM | POA: Diagnosis not present

## 2017-11-11 HISTORY — PX: ORIF ELBOW FRACTURE: SHX5031

## 2017-11-11 SURGERY — OPEN REDUCTION INTERNAL FIXATION (ORIF) ELBOW/OLECRANON FRACTURE
Anesthesia: General | Site: Elbow | Laterality: Left

## 2017-11-11 MED ORDER — CEFAZOLIN SODIUM-DEXTROSE 2-4 GM/100ML-% IV SOLN
2.0000 g | INTRAVENOUS | Status: AC
  Administered 2017-11-11: 2 g via INTRAVENOUS

## 2017-11-11 MED ORDER — PHENYLEPHRINE 40 MCG/ML (10ML) SYRINGE FOR IV PUSH (FOR BLOOD PRESSURE SUPPORT)
PREFILLED_SYRINGE | INTRAVENOUS | Status: DC | PRN
  Administered 2017-11-11 (×4): 80 ug via INTRAVENOUS

## 2017-11-11 MED ORDER — BISACODYL 5 MG PO TBEC: 5.0000 mg | DELAYED_RELEASE_TABLET | Freq: Every day | ORAL | Status: DC | PRN

## 2017-11-11 MED ORDER — DEXAMETHASONE SODIUM PHOSPHATE 10 MG/ML IJ SOLN
INTRAMUSCULAR | Status: AC
  Filled 2017-11-11: qty 1

## 2017-11-11 MED ORDER — VANCOMYCIN HCL 1000 MG IV SOLR
INTRAVENOUS | Status: AC
  Filled 2017-11-11: qty 1000

## 2017-11-11 MED ORDER — MIDAZOLAM HCL 2 MG/2ML IJ SOLN
INTRAMUSCULAR | Status: AC
Start: 2017-11-11 — End: 2017-11-11
  Filled 2017-11-11: qty 2

## 2017-11-11 MED ORDER — PANTOPRAZOLE SODIUM 40 MG PO TBEC
40.0000 mg | DELAYED_RELEASE_TABLET | Freq: Every day | ORAL | Status: DC
  Administered 2017-11-12: 40 mg via ORAL
  Filled 2017-11-11: qty 1

## 2017-11-11 MED ORDER — MAGNESIUM HYDROXIDE 400 MG/5ML PO SUSP: Freq: Every day | ORAL | Status: DC | PRN

## 2017-11-11 MED ORDER — RIFAXIMIN 200 MG PO TABS
400.0000 mg | ORAL_TABLET | Freq: Three times a day (TID) | ORAL | Status: DC
  Administered 2017-11-11 – 2017-11-12 (×2): 400 mg via ORAL
  Filled 2017-11-11 (×3): qty 2

## 2017-11-11 MED ORDER — SPIRONOLACTONE 25 MG PO TABS
100.0000 mg | ORAL_TABLET | Freq: Every day | ORAL | Status: DC
  Administered 2017-11-12: 100 mg via ORAL
  Filled 2017-11-11: qty 4

## 2017-11-11 MED ORDER — LACTULOSE 10 GM/15ML PO SOLN
30.0000 g | Freq: Four times a day (QID) | ORAL | Status: DC
  Administered 2017-11-11: 30 g via ORAL
  Filled 2017-11-11: qty 45

## 2017-11-11 MED ORDER — FENTANYL CITRATE (PF) 100 MCG/2ML IJ SOLN
INTRAMUSCULAR | Status: AC
  Administered 2017-11-11: 50 ug via INTRAVENOUS
  Filled 2017-11-11: qty 2

## 2017-11-11 MED ORDER — METHOCARBAMOL 500 MG PO TABS: 500.0000 mg | ORAL_TABLET | Freq: Four times a day (QID) | ORAL | Status: DC | PRN

## 2017-11-11 MED ORDER — FENTANYL CITRATE (PF) 100 MCG/2ML IJ SOLN
50.0000 ug | Freq: Once | INTRAMUSCULAR | Status: AC
  Administered 2017-11-11: 50 ug via INTRAVENOUS
  Filled 2017-11-11: qty 1

## 2017-11-11 MED ORDER — BISACODYL 10 MG RE SUPP: 10.0000 mg | RECTAL | Status: DC | PRN

## 2017-11-11 MED ORDER — PROPOFOL 10 MG/ML IV BOLUS
INTRAVENOUS | Status: AC
  Filled 2017-11-11: qty 40

## 2017-11-11 MED ORDER — FENTANYL CITRATE (PF) 100 MCG/2ML IJ SOLN
INTRAMUSCULAR | Status: DC | PRN
  Administered 2017-11-11: 50 ug via INTRAVENOUS

## 2017-11-11 MED ORDER — CEFAZOLIN SODIUM-DEXTROSE 2-4 GM/100ML-% IV SOLN
2.0000 g | Freq: Three times a day (TID) | INTRAVENOUS | Status: AC
  Administered 2017-11-11 – 2017-11-12 (×3): 2 g via INTRAVENOUS
  Filled 2017-11-11 (×4): qty 100

## 2017-11-11 MED ORDER — POLYVINYL ALCOHOL 1.4 % OP SOLN
1.0000 [drp] | Freq: Every day | OPHTHALMIC | Status: DC
  Administered 2017-11-11: 1 [drp] via OPHTHALMIC
  Filled 2017-11-11: qty 15

## 2017-11-11 MED ORDER — LIDOCAINE 2% (20 MG/ML) 5 ML SYRINGE
INTRAMUSCULAR | Status: AC
  Filled 2017-11-11: qty 5

## 2017-11-11 MED ORDER — CHLORHEXIDINE GLUCONATE 4 % EX LIQD: 60.0000 mL | Freq: Once | CUTANEOUS | Status: DC

## 2017-11-11 MED ORDER — DEXAMETHASONE SODIUM PHOSPHATE 10 MG/ML IJ SOLN
INTRAMUSCULAR | Status: DC | PRN
  Administered 2017-11-11: 10 mg via INTRAVENOUS

## 2017-11-11 MED ORDER — ROCURONIUM BROMIDE 10 MG/ML (PF) SYRINGE
PREFILLED_SYRINGE | INTRAVENOUS | Status: AC
  Filled 2017-11-11: qty 5

## 2017-11-11 MED ORDER — FUROSEMIDE 40 MG PO TABS
40.0000 mg | ORAL_TABLET | Freq: Every day | ORAL | Status: DC
  Administered 2017-11-11 – 2017-11-12 (×2): 40 mg via ORAL
  Filled 2017-11-11 (×2): qty 1

## 2017-11-11 MED ORDER — ACETAMINOPHEN 650 MG RE SUPP: 650.0000 mg | Freq: Four times a day (QID) | RECTAL | Status: DC | PRN

## 2017-11-11 MED ORDER — MEPERIDINE HCL 25 MG/ML IJ SOLN: 6.2500 mg | INTRAMUSCULAR | Status: DC | PRN

## 2017-11-11 MED ORDER — ACETAMINOPHEN 325 MG PO TABS: 650.0000 mg | ORAL_TABLET | Freq: Four times a day (QID) | ORAL | Status: DC | PRN

## 2017-11-11 MED ORDER — 0.9 % SODIUM CHLORIDE (POUR BTL) OPTIME
TOPICAL | Status: DC | PRN
  Administered 2017-11-11: 1000 mL

## 2017-11-11 MED ORDER — AMPHETAMINE-DEXTROAMPHETAMINE 10 MG PO TABS
20.0000 mg | ORAL_TABLET | Freq: Every day | ORAL | Status: DC
  Administered 2017-11-12: 20 mg via ORAL
  Filled 2017-11-11: qty 2

## 2017-11-11 MED ORDER — ROPIVACAINE HCL 5 MG/ML IJ SOLN
INTRAMUSCULAR | Status: DC | PRN
  Administered 2017-11-11: 30 mL via PERINEURAL

## 2017-11-11 MED ORDER — ZOLPIDEM TARTRATE 5 MG PO TABS: 5.0000 mg | ORAL_TABLET | Freq: Every evening | ORAL | Status: DC | PRN

## 2017-11-11 MED ORDER — FENTANYL CITRATE (PF) 250 MCG/5ML IJ SOLN
INTRAMUSCULAR | Status: AC
  Filled 2017-11-11: qty 5

## 2017-11-11 MED ORDER — PROPOFOL 10 MG/ML IV BOLUS
INTRAVENOUS | Status: DC | PRN
  Administered 2017-11-11: 140 mg via INTRAVENOUS

## 2017-11-11 MED ORDER — HYDROCODONE-ACETAMINOPHEN 5-325 MG PO TABS
1.0000 | ORAL_TABLET | ORAL | Status: DC | PRN
  Administered 2017-11-11 – 2017-11-12 (×2): 2 via ORAL
  Filled 2017-11-11 (×3): qty 2

## 2017-11-11 MED ORDER — METOCLOPRAMIDE HCL 5 MG/ML IJ SOLN: 10.0000 mg | Freq: Once | INTRAMUSCULAR | Status: DC | PRN

## 2017-11-11 MED ORDER — SUGAMMADEX SODIUM 200 MG/2ML IV SOLN
INTRAVENOUS | Status: DC | PRN
  Administered 2017-11-11: 200 mg via INTRAVENOUS

## 2017-11-11 MED ORDER — LACTATED RINGERS IV SOLN
INTRAVENOUS | Status: DC
  Administered 2017-11-11 (×2): via INTRAVENOUS

## 2017-11-11 MED ORDER — CEFAZOLIN SODIUM-DEXTROSE 2-4 GM/100ML-% IV SOLN
INTRAVENOUS | Status: AC
  Filled 2017-11-11: qty 100

## 2017-11-11 MED ORDER — METHOCARBAMOL 1000 MG/10ML IJ SOLN: 500.0000 mg | Freq: Four times a day (QID) | INTRAMUSCULAR | Status: DC | PRN

## 2017-11-11 MED ORDER — HYDROCORTISONE 1 % EX CREA
1.0000 "application " | TOPICAL_CREAM | Freq: Two times a day (BID) | CUTANEOUS | Status: DC | PRN
  Filled 2017-11-11: qty 28

## 2017-11-11 MED ORDER — DOCUSATE SODIUM 100 MG PO CAPS
100.0000 mg | ORAL_CAPSULE | Freq: Two times a day (BID) | ORAL | Status: DC
  Administered 2017-11-11 – 2017-11-12 (×2): 100 mg via ORAL
  Filled 2017-11-11 (×2): qty 1

## 2017-11-11 MED ORDER — MIDAZOLAM HCL 2 MG/2ML IJ SOLN
INTRAMUSCULAR | Status: AC
  Filled 2017-11-11: qty 2

## 2017-11-11 MED ORDER — HYDROXYZINE HCL 25 MG PO TABS: 25.0000 mg | ORAL_TABLET | Freq: Four times a day (QID) | ORAL | Status: DC | PRN

## 2017-11-11 MED ORDER — ROCURONIUM BROMIDE 100 MG/10ML IV SOLN
INTRAVENOUS | Status: DC | PRN
  Administered 2017-11-11: 50 mg via INTRAVENOUS

## 2017-11-11 MED ORDER — SODIUM FLUORIDE 0.2 % MT SOLN: 1.0000 mL | OROMUCOSAL | Status: DC

## 2017-11-11 MED ORDER — VANCOMYCIN HCL 1000 MG IV SOLR
INTRAVENOUS | Status: DC | PRN
  Administered 2017-11-11: 1000 mg

## 2017-11-11 MED ORDER — PHENYLEPHRINE HCL 10 MG/ML IJ SOLN
INTRAMUSCULAR | Status: AC
  Filled 2017-11-11: qty 1

## 2017-11-11 MED ORDER — ONDANSETRON HCL 4 MG/2ML IJ SOLN
INTRAMUSCULAR | Status: AC
  Filled 2017-11-11: qty 2

## 2017-11-11 MED ORDER — LIDOCAINE HCL (CARDIAC) 20 MG/ML IV SOLN
INTRAVENOUS | Status: DC | PRN
  Administered 2017-11-11: 100 mg via INTRAVENOUS

## 2017-11-11 MED ORDER — FENTANYL CITRATE (PF) 100 MCG/2ML IJ SOLN: 25.0000 ug | INTRAMUSCULAR | Status: DC | PRN

## 2017-11-11 MED ORDER — MELATONIN 3 MG PO TABS
6.0000 mg | ORAL_TABLET | Freq: Every day | ORAL | Status: DC
  Administered 2017-11-11: 6 mg via ORAL
  Filled 2017-11-11: qty 2

## 2017-11-11 MED ORDER — ADULT MULTIVITAMIN W/MINERALS CH
1.0000 | ORAL_TABLET | Freq: Every day | ORAL | Status: DC
  Administered 2017-11-12: 1 via ORAL
  Filled 2017-11-11: qty 1

## 2017-11-11 MED ORDER — PHENYLEPHRINE 40 MCG/ML (10ML) SYRINGE FOR IV PUSH (FOR BLOOD PRESSURE SUPPORT)
PREFILLED_SYRINGE | INTRAVENOUS | Status: AC
  Filled 2017-11-11: qty 10

## 2017-11-11 MED ORDER — ONDANSETRON HCL 4 MG/2ML IJ SOLN
INTRAMUSCULAR | Status: DC | PRN
  Administered 2017-11-11: 4 mg via INTRAVENOUS

## 2017-11-11 MED ORDER — SUGAMMADEX SODIUM 200 MG/2ML IV SOLN
INTRAVENOUS | Status: AC
  Filled 2017-11-11: qty 2

## 2017-11-11 MED ORDER — BACITRACIN ZINC 500 UNIT/GM EX OINT
TOPICAL_OINTMENT | CUTANEOUS | Status: AC
  Filled 2017-11-11: qty 28.35

## 2017-11-11 MED ORDER — MORPHINE SULFATE (PF) 2 MG/ML IV SOLN
2.0000 mg | INTRAVENOUS | Status: DC | PRN
  Administered 2017-11-12: 2 mg via INTRAVENOUS
  Filled 2017-11-11: qty 1

## 2017-11-11 SURGICAL SUPPLY — 68 items
BANDAGE ACE 3X5.8 VEL STRL LF (GAUZE/BANDAGES/DRESSINGS) ×3 IMPLANT
BANDAGE ELASTIC 4 VELCRO ST LF (GAUZE/BANDAGES/DRESSINGS) ×3 IMPLANT
BANDAGE ELASTIC 6 VELCRO ST LF (GAUZE/BANDAGES/DRESSINGS) ×3 IMPLANT
BENZOIN TINCTURE PRP APPL 2/3 (GAUZE/BANDAGES/DRESSINGS) IMPLANT
BIT DRILL 2.5X110 QC LCP DISP (BIT) ×3 IMPLANT
BIT DRILL LCP QC 2X140 (BIT) ×3 IMPLANT
BLADE AVERAGE 25MMX9MM (BLADE)
BLADE AVERAGE 25X9 (BLADE) IMPLANT
BLADE SURG 10 STRL SS (BLADE) ×3 IMPLANT
BNDG ESMARK 4X9 LF (GAUZE/BANDAGES/DRESSINGS) ×3 IMPLANT
BRUSH SCRUB SURG 4.25 DISP (MISCELLANEOUS) ×6 IMPLANT
CHLORAPREP W/TINT 26ML (MISCELLANEOUS) ×3 IMPLANT
COVER SURGICAL LIGHT HANDLE (MISCELLANEOUS) ×6 IMPLANT
DRAPE C-ARM 42X72 X-RAY (DRAPES) ×3 IMPLANT
DRAPE C-ARMOR (DRAPES) ×3 IMPLANT
DRAPE INCISE IOBAN 66X45 STRL (DRAPES) ×3 IMPLANT
DRAPE U-SHAPE 47X51 STRL (DRAPES) ×3 IMPLANT
DRSG ADAPTIC 3X8 NADH LF (GAUZE/BANDAGES/DRESSINGS) ×3 IMPLANT
ELECT REM PT RETURN 9FT ADLT (ELECTROSURGICAL) ×3
ELECTRODE REM PT RTRN 9FT ADLT (ELECTROSURGICAL) ×1 IMPLANT
GAUZE SPONGE 4X4 12PLY STRL (GAUZE/BANDAGES/DRESSINGS) ×3 IMPLANT
GLOVE BIO SURGEON STRL SZ7.5 (GLOVE) ×12 IMPLANT
GLOVE BIOGEL PI IND STRL 7.5 (GLOVE) ×1 IMPLANT
GLOVE BIOGEL PI INDICATOR 7.5 (GLOVE) ×2
GOWN STRL REUS W/ TWL LRG LVL3 (GOWN DISPOSABLE) ×2 IMPLANT
GOWN STRL REUS W/TWL LRG LVL3 (GOWN DISPOSABLE) ×4
KIT BASIN OR (CUSTOM PROCEDURE TRAY) ×3 IMPLANT
KIT ROOM TURNOVER OR (KITS) ×3 IMPLANT
MANIFOLD NEPTUNE II (INSTRUMENTS) ×3 IMPLANT
NS IRRIG 1000ML POUR BTL (IV SOLUTION) ×3 IMPLANT
PACK ORTHO EXTREMITY (CUSTOM PROCEDURE TRAY) ×3 IMPLANT
PAD ARMBOARD 7.5X6 YLW CONV (MISCELLANEOUS) ×6 IMPLANT
PAD CAST 4YDX4 CTTN HI CHSV (CAST SUPPLIES) ×1 IMPLANT
PADDING CAST COTTON 4X4 STRL (CAST SUPPLIES) ×2
PLATE ELBOW LT PROX 2.7/3.5 2H (Plate) ×3 IMPLANT
SCREW CORTEX 3.5 30MM (Screw) ×2 IMPLANT
SCREW LOCK CORT ST 3.5X30 (Screw) ×1 IMPLANT
SCREW LOCK T15 FT 24X3.5X2.9X (Screw) ×1 IMPLANT
SCREW LOCK VA ST 2.7X14 (Screw) ×3 IMPLANT
SCREW LOCK VA ST 2.7X18 (Screw) ×3 IMPLANT
SCREW LOCK VA ST 2.7X20 (Screw) ×3 IMPLANT
SCREW LOCKING 2.7X16MM VA (Screw) ×3 IMPLANT
SCREW LOCKING 3.5X24 (Screw) ×2 IMPLANT
SCREW LOCKING VA 2.7X30MM (Screw) ×3 IMPLANT
SCREW LOCKING VA 2.7X48 (Screw) ×3 IMPLANT
SCREW VA LOCKING 2.7X36 (Screw) ×2 IMPLANT
SCREW VA LOCKING 2.7X36 VA (Screw) ×1 IMPLANT
SPONGE LAP 18X18 X RAY DECT (DISPOSABLE) ×6 IMPLANT
STAPLER VISISTAT 35W (STAPLE) ×3 IMPLANT
SUCTION FRAZIER HANDLE 10FR (MISCELLANEOUS) ×2
SUCTION TUBE FRAZIER 10FR DISP (MISCELLANEOUS) ×1 IMPLANT
SUT MNCRL AB 3-0 PS2 18 (SUTURE) ×3 IMPLANT
SUT MON AB 2-0 CT1 36 (SUTURE) ×3 IMPLANT
SUT PDS AB 2-0 CT1 27 (SUTURE) IMPLANT
SUT PROLENE 0 CT (SUTURE) IMPLANT
SUT PROLENE 3 0 PS 2 (SUTURE) IMPLANT
SUT VIC AB 0 CT1 27 (SUTURE) ×2
SUT VIC AB 0 CT1 27XBRD ANBCTR (SUTURE) ×1 IMPLANT
SUT VIC AB 2-0 CT1 27 (SUTURE)
SUT VIC AB 2-0 CT1 TAPERPNT 27 (SUTURE) IMPLANT
SUT VIC AB 2-0 CT3 27 (SUTURE) ×3 IMPLANT
TOWEL OR 17X24 6PK STRL BLUE (TOWEL DISPOSABLE) IMPLANT
TOWEL OR 17X26 10 PK STRL BLUE (TOWEL DISPOSABLE) ×6 IMPLANT
TUBE CONNECTING 12'X1/4 (SUCTIONS) ×1
TUBE CONNECTING 12X1/4 (SUCTIONS) ×2 IMPLANT
UNDERPAD 30X30 (UNDERPADS AND DIAPERS) ×3 IMPLANT
WATER STERILE IRR 1000ML POUR (IV SOLUTION) ×3 IMPLANT
YANKAUER SUCT BULB TIP NO VENT (SUCTIONS) ×3 IMPLANT

## 2017-11-11 NOTE — Op Note (Signed)
OrthopaedicSurgeryOperativeNote [phone]) Date of Surgery: 11/11/2017  Admit Date: 11/11/2017   Diagnoses: Pre-Op Diagnoses: Left displaced olecranon fracture  Post-Op Diagnosis: Same  Procedures: CPT 246985-ORIF of left olecranon fracture  Surgeons: Primary: Shona Needles, MD   Location:[facility] OR ROOM 03   AnesthesiaGeneral   Antibiotics:Ancef 2g preop   Tourniquettime:None  [id number]:57 mL   Complications:None  Specimens:None  Implants: Implant Name Type Inv. Item Serial No. Manufacturer Lot No. LRB No. Used Action  SCREW CORTEX 3.5 30MM - [id number] Screw SCREW CORTEX 3.5 30MM  [employer]  Left 1 Implanted  SCREW LOCKING 3.5X24 - [id number] Screw SCREW LOCKING 3.5X24  [employer]  Left 1 Implanted  PLATE ELBOW LT PROX 0.2/7.2 2H - [id number] Plate PLATE ELBOW LT PROX 7.4/2.5 2H  [employer]  Left 1 Implanted  SCREW LOCKING 2.7X14MM - [id number] Screw SCREW LOCKING 2.7X14MM  [employer]  Left 1 Implanted  SCREW LOCKING 2.7X16MM VA - [id number] Screw SCREW LOCKING 2.7X16MM VA  [employer]  Left 1 Implanted  SCREW LOCKING 2.7X20MM - [id number] Screw SCREW LOCKING 2.7X20MM  [employer]  Left 1 Implanted  SCREW LOCKING VA 2.7X30MM - [id number] Screw SCREW LOCKING VA 2.7X30MM  [employer]  Left 1 Implanted  SCREW LOCKING 2.7X18MM - [id number] Screw SCREW LOCKING 2.7X18MM  [employer]  Left 1 Implanted  2.38mm locking screw 23mm    [employer]  Left 1 Implanted    IndicationsforSurgery: 72 y.o. male with a history of hepatitis C and cirrhosis who had a fall with a displaced olecranon fracture. I discussed treatment options with him. I felt that nonoperative treatment could be considered but it would limit his strength especially in extension.  He may need a walker in the near future and would need to the strength of his arms to assist with ambulation. In light of this he and his sister wished to proceed with ORIF. Risks discussed  included bleeding requiring blood transfusion, bleeding causing a hematoma, infection, malunion, nonunion, damage to surrounding nerves and blood vessels, pain, hardware prominence or irritation, hardware failure, stiffness, post-traumatic arthritis, DVT/PE, compartment syndrome, and even death. Risks and benefits were extensively discussed as noted above and the patient and their family agreed to proceed with surgery and consent was obtained.  Operative Findings: ORIF of left olecranon fracture with Synthes 2-hole olecranon plate  Procedure: The patient was identified in the preoperative holding area. Consent was confirmed with the patient and their family and all questions were answered. The operative extremity was marked after confirmation with the patient. The patient was then brought back to the operating room by our anesthesia colleagues.  The patient was then placed under general anesthetic and carried over to a radiolucent flat top table.  Here they were placed in the lateral decubitus position with the operative side up.  The down arm was well padded and positioned out of the way of fluoroscopy. The operative arm was then draped over a bone foam. The operative extremity was then prepped and draped in usual sterile fashion. A preoperative timeout was performed to verify the patient, the procedure, and the extremity. Preoperative antibiotics were dosed.  A standard posterior approach to the olecranon was made.  It was carried down through skin and subcutaneous tissue.  The fracture with was exposed.  Periosteum was removed from the fracture edges.  The hematoma was cleaned out and irrigated.  The incision was extended between the FCU and ECU down to the ulna.  The periosteum was reflected off of the  ulnar shaft to be able to place the olecranon plate.  Once the fracture was cleaned out a towel clip was used to manipulate the proximal fragment with the triceps attachment.  A 2.5 millimeter drill bit  was used to place a drill hole into the ulnar shaft.  A reduction forceps was placed in this hole and the other tine was placed along the proximal end of the fracture fragment.  The fracture was reduced and then pinned in place with a number of 1.6 mm K wires.  A two-hole proximal ulna VA locking plate was then contoured to fit along the olecranon.  The triceps was split to allow the plate to be more flush to bone.  The reduction clamp was used to compress the plate down to the proximal segment.  A nonlocking plate screw was placed in the shaft to align the distal portion of the plate.  I then used the [place] guide to proceed to place locking screws both across the fracture and into the proximal segment. Locking screws were placed in the proximal and distal segments.  Final fluoroscopic images were obtained.  The incision was thoroughly irrigated.  A gram of vancomycin powder was placed into the incision.  A 0 Vicryl was used to close the soft tissue over the plate.  The skin was then closed with 2-0 Vicryl and 3-0 nylon.  A sterile dressing consisting of Mepilex and Ace wraps was used to dress the arm.  She was then placed back in her sling.  She was placed supine, extubated and taken to the PACU in stable condition.  Post Op Plan/Instructions: The patient will be nonweightbearing to the left upper extremity.  They may start range of motion of the elbow right away. They may continue with the sling as needed for comfort. He will receive postoperative Ancef. He will be restarted on his anticoagulation for his DVT.  I was present and performed the entire surgery.  Katha Hamming, MD [facility]

## 2017-11-11 NOTE — Transfer of Care (Signed)
Immediate Anesthesia Transfer of Care Note  Patient: Joshua [name]  Procedure(s) Performed: OPEN REDUCTION INTERNAL FIXATION LEFT OLECRANON FRACTURE (Left Elbow)  Patient Location: PACU  Anesthesia Type:GA combined with regional for post-op pain  Level of Consciousness: awake, alert  and oriented  Airway & Oxygen Therapy: Patient Spontanous Breathing and Patient connected to nasal cannula oxygen  Post-op Assessment: Report given to RN and Post -op Vital signs reviewed and stable  Post vital signs: Reviewed and stable  Last Vitals:  Vitals:   11/11/17 0622  BP: (!) 149/62  Pulse: 85  Resp: 18  Temp: 36.7 C  SpO2: 100%    Last Pain:  Vitals:   11/11/17 0622  TempSrc: Oral         Complications: No apparent anesthesia complications

## 2017-11-11 NOTE — Anesthesia Postprocedure Evaluation (Signed)
Anesthesia Post Note  Patient: Joshua [name]  Procedure(s) Performed: OPEN REDUCTION INTERNAL FIXATION LEFT OLECRANON FRACTURE (Left Elbow)     Patient location during evaluation: PACU Anesthesia Type: General Level of consciousness: awake and alert Pain management: pain level controlled Vital Signs Assessment: post-procedure vital signs reviewed and stable Respiratory status: spontaneous breathing, nonlabored ventilation, respiratory function stable and patient connected to nasal cannula oxygen Cardiovascular status: blood pressure returned to baseline and stable Postop Assessment: no apparent nausea or vomiting Anesthetic complications: no    Last Vitals:  Vitals:   11/11/17 1045 11/11/17 1120  BP: 134/79 (!) 119/59  Pulse: 78 77  Resp: 14   Temp: 36.6 C 36.6 C  SpO2: 100% 99%    Last Pain:  Vitals:   11/11/17 1120  TempSrc: Oral                 Montez Hageman

## 2017-11-11 NOTE — Anesthesia Procedure Notes (Signed)
Anesthesia Regional Block: Supraclavicular block   Pre-Anesthetic Checklist: ,, timeout performed, Correct Patient, Correct Site, Correct Laterality, Correct Procedure, Correct Position, site marked, Risks and benefits discussed,  Surgical consent,  Pre-op evaluation,  At surgeon's request and post-op pain management  Laterality: Left and Upper  Prep: Maximum Sterile Barrier Precautions used, chloraprep       Needles:  Injection technique: Single-shot  Needle Type: Echogenic Stimulator Needle     Needle Length: 10cm      Additional Needles:   Procedures:,,,, ultrasound used (permanent image in chart),,,,  Narrative:  Start time: 11/11/2017 7:56 AM End time: 11/11/2017 8:01 AM Injection made incrementally with aspirations every 5 mL.  Performed by: Personally  Anesthesiologist: Montez Hageman, MD  Additional Notes: Risks, benefits and alternative to block explained extensively.  Patient tolerated procedure well, without complications.

## 2017-11-11 NOTE — Anesthesia Preprocedure Evaluation (Signed)
Anesthesia Evaluation  Patient identified by MRN, date of birth, ID band Patient awake    Reviewed: Allergy & Precautions, NPO status , Patient's Chart, lab work & pertinent test results  Airway Mallampati: II  TM Distance: >3 FB Neck ROM: Full    Dental no notable dental hx. (+) Missing   Pulmonary former smoker,    Pulmonary exam normal breath sounds clear to auscultation       Cardiovascular hypertension, Normal cardiovascular exam Rhythm:Regular Rate:Normal     Neuro/Psych negative neurological ROS  negative psych ROS   GI/Hepatic negative GI ROS, (+) Cirrhosis       , Hepatitis - (treated), C  Endo/Other  negative endocrine ROS  Renal/GU negative Renal ROS  negative genitourinary   Musculoskeletal negative musculoskeletal ROS (+)   Abdominal   Peds negative pediatric ROS (+)  Hematology negative hematology ROS (+)   Anesthesia Other Findings   Reproductive/Obstetrics negative OB ROS                             Anesthesia Physical Anesthesia Plan  ASA: III  Anesthesia Plan: General   Post-op Pain Management:  Regional for Post-op pain   Induction: Intravenous  PONV Risk Score and Plan: 2  Airway Management Planned: LMA  Additional Equipment:   Intra-op Plan:   Post-operative Plan:   Informed Consent: I have reviewed the patients History and Physical, chart, labs and discussed the procedure including the risks, benefits and alternatives for the proposed anesthesia with the patient or authorized representative who has indicated his/her understanding and acceptance.   Dental advisory given  Plan Discussed with: CRNA  Anesthesia Plan Comments:         Anesthesia Quick Evaluation

## 2017-11-11 NOTE — Anesthesia Procedure Notes (Signed)
Procedure Name: Intubation Date/Time: 11/11/2017 8:16 AM Performed by: Candis Shine, CRNA Pre-anesthesia Checklist: Patient identified, Emergency Drugs available, Suction available and Patient being monitored Patient Re-evaluated:Patient Re-evaluated prior to induction Oxygen Delivery Method: Circle System Utilized Preoxygenation: Pre-oxygenation with 100% oxygen Induction Type: IV induction Ventilation: Mask ventilation without difficulty Laryngoscope Size: Mac and 4 Grade View: Grade I Tube type: Oral Tube size: 7.5 mm Number of attempts: 1 Airway Equipment and Method: Stylet Placement Confirmation: ETT inserted through vocal cords under direct vision,  positive ETCO2 and breath sounds checked- equal and bilateral Secured at: 21 cm Tube secured with: Tape Dental Injury: Teeth and Oropharynx as per pre-operative assessment

## 2017-11-11 NOTE — Interval H&P Note (Signed)
History and Physical Interval Note:  11/11/2017 8:01 AM  Joshua [name]  has presented today for surgery, with the diagnosis of LEFT OLECRANON FRACTURE  The various methods of treatment have been discussed with the patient and family. After consideration of risks, benefits and other options for treatment, the patient has consented to  Procedure(s): LEFT OPEN REDUCTION INTERNAL FIXATION (ORIF) ELBOW/OLECRANON FRACTURE (Left) as a surgical intervention .  The patient's history has been reviewed, patient examined, no change in status, stable for surgery.  I have reviewed the patient's chart and labs.  Questions were answered to the patient's satisfaction.     Nalah Macioce, Thomasene Lot

## 2017-11-12 DIAGNOSIS — S52022A Displaced fracture of olecranon process without intraarticular extension of left ulna, initial encounter for closed fracture: Secondary | ICD-10-CM | POA: Diagnosis not present

## 2017-11-12 DIAGNOSIS — B1920 Unspecified viral hepatitis C without hepatic coma: Secondary | ICD-10-CM | POA: Diagnosis not present

## 2017-11-12 DIAGNOSIS — I10 Essential (primary) hypertension: Secondary | ICD-10-CM | POA: Diagnosis not present

## 2017-11-12 DIAGNOSIS — Z86718 Personal history of other venous thrombosis and embolism: Secondary | ICD-10-CM | POA: Diagnosis not present

## 2017-11-12 DIAGNOSIS — Z79899 Other long term (current) drug therapy: Secondary | ICD-10-CM | POA: Diagnosis not present

## 2017-11-12 DIAGNOSIS — Z7901 Long term (current) use of anticoagulants: Secondary | ICD-10-CM | POA: Diagnosis not present

## 2017-11-12 MED ORDER — TRAMADOL HCL 50 MG PO TABS: 50.0000 mg | ORAL_TABLET | Freq: Three times a day (TID) | ORAL | 0 refills | Status: AC | PRN

## 2017-11-12 MED ORDER — ACETAMINOPHEN 325 MG PO TABS: 325.0000 mg | ORAL_TABLET | Freq: Three times a day (TID) | ORAL | 0 refills | Status: AC | PRN

## 2017-11-12 MED ORDER — METHOCARBAMOL 500 MG PO TABS: 500.0000 mg | ORAL_TABLET | Freq: Four times a day (QID) | ORAL | 0 refills | Status: AC | PRN

## 2017-11-12 NOTE — Care Management Obs Status (Signed)
[facility] NOTIFICATION   Patient Details  Name: MIKAH POSS MRN: [id number] Date of Birth: June 14, 1946   Medicare Observation Status Notification Given:  Yes    Carles Collet, RN 11/12/2017, 8:45 AM

## 2017-11-12 NOTE — Evaluation (Signed)
Occupational Therapy Evaluation Patient Details Name: Joshua [name] MRN: [id number] DOB: 1946-10-21 Today's Date: 11/12/2017    History of Present Illness Pt s/p left ORIF elbow/olecranon fx.    Clinical Impression   Pt s/p procedure listed above.  Pt requiring min assist for ADLs and supervision for functional mobility. Therapist completed educated regarding edema control (including ice, elevation and ROM of hand and shoulder) and ADL techniques.  Pt is from SNF ([facility] per his report) and plans to return there after leaving hospital.  No further acute OT needs at this time. Recommend return to SNF where he can continue to with rehab and eventual return home.    Follow Up Recommendations  SNF    Equipment Recommendations  (TBD at next venue of care)    Recommendations for Other Services       Precautions / Restrictions Precautions Precautions: Fall Restrictions Weight Bearing Restrictions: Yes LUE Weight Bearing: Non weight bearing      Mobility Bed Mobility               General bed mobility comments: pt up in chair upon therapist arrival  Transfers Overall transfer level: Needs assistance Equipment used: None Transfers: Sit to/from Stand Sit to Stand: Supervision         General transfer comment: supervision for safety    Balance                                           ADL either performed or assessed with clinical judgement   ADL Overall ADL's : Needs assistance/impaired Eating/Feeding: Set up;Sitting   Grooming: Set up;Standing;Supervision/safety   Upper Body Bathing: Minimal assistance;Sitting   Lower Body Bathing: Set up;Supervison/ safety;Sit to/from stand   Upper Body Dressing : Minimal assistance;Sitting   Lower Body Dressing: Minimal assistance;Sit to/from stand   Toilet Transfer: Supervision/safety;Ambulation   Toileting- Clothing Manipulation and Hygiene: Supervision/safety;Sit to/from stand        Functional mobility during ADLs: Supervision/safety General ADL Comments: Therapist provided education regarding ADL techniques, ROM and strategies for edema control.  Pt with sling on upon therapist arrival. Therapist reviewed donning sling and proper sling positioning.  Demonstrated gentle elbow ROM once his dressing is removed later (MD note mentions elbow ROM will be allowed).      Vision Baseline Vision/History: Wears glasses       Perception     Praxis      Pertinent Vitals/Pain Pain Assessment: 0-10 Pain Score: 7  Pain Location: left elbow Pain Descriptors / Indicators: Operative site guarding Pain Intervention(s): Ice applied     Hand Dominance Left   Extremity/Trunk Assessment Upper Extremity Assessment Upper Extremity Assessment: LUE deficits/detail LUE Deficits / Details: Left elbow ROM limited due to dressing.  Shoulder and hand ROM WNL. LUE: Unable to fully assess due to immobilization           Communication Communication Communication: HOH   Cognition Arousal/Alertness: Awake/alert Behavior During Therapy: WFL for tasks assessed/performed Overall Cognitive Status: Within Functional Limits for tasks assessed                                     General Comments       Exercises     Shoulder Instructions      Home Living Family/patient expects to be  discharged to:: Skilled nursing facility                                        Prior Functioning/Environment Level of Independence: Independent        Comments: independent prior to 9/25 admission. Has been at [facility] for past few weeks and has been receiving assist with ADLs as needed.        OT Problem List: Pain;Impaired UE functional use;Decreased range of motion      OT Treatment/Interventions:      OT Goals(Current goals can be found in the care plan section) Acute Rehab OT Goals Patient Stated Goal: to return to SNF OT Goal Formulation: All assessment and  education complete, DC therapy  OT Frequency:     Barriers to D/C:            Co-evaluation              AM-PAC PT "6 Clicks" Daily Activity     Outcome Measure Help from another person eating meals?: A Little Help from another person taking care of personal grooming?: A Little Help from another person toileting, which includes using toliet, bedpan, or urinal?: A Little Help from another person bathing (including washing, rinsing, drying)?: A Little Help from another person to put on and taking off regular upper body clothing?: A Little Help from another person to put on and taking off regular lower body clothing?: A Little 6 Click Score: 18   End of Session Equipment Utilized During Treatment: Oxygen(left sling)  Activity Tolerance: Patient tolerated treatment well Patient left: in chair;with call bell/phone within reach;with nursing/sitter in room  OT Visit Diagnosis: Unsteadiness on feet (R26.81);Pain Pain - Right/Left: Left Pain - part of body: Arm                Time: 0626-9485 OT Time Calculation (min): 27 min Charges:  OT General Charges $OT Visit: 1 Visit OT Evaluation $OT Eval Low Complexity: 1 Low OT Treatments $Self Care/Home Management : 8-22 mins G-Codes: OT G-codes **NOT FOR INPATIENT CLASS** Functional Assessment Tool Used: Clinical judgement Functional Limitation: Self care Self Care Current Status (I6270): At least 1 percent but less than 20 percent impaired, limited or restricted Self Care Goal Status (J5009): At least 1 percent but less than 20 percent impaired, limited or restricted Self Care Discharge Status [phone]): At least 1 percent but less than 20 percent impaired, limited or restricted   Darrol Jump OTR/L 11/12/2017, 8:58 AM

## 2017-11-12 NOTE — Progress Notes (Signed)
Orthopaedic Trauma Service (OTS)  1 Day Post-Op Procedure(s) (LRB): OPEN REDUCTION INTERNAL FIXATION LEFT OLECRANON FRACTURE (Left)  Subjective: Patient reports pain as mild. Jovial spirits!  Objective: Current Vitals Blood pressure 136/68, pulse 76, temperature 98.4 F (36.9 C), temperature source Oral, resp. rate 16, weight 86.6 kg (191 lb), SpO2 99 %. Vital signs in last 24 hours: Temp:  [97.9 F (36.6 C)-98.4 F (36.9 C)] 98.4 F (36.9 C) (01/19 0338) Pulse Rate:  [69-77] 76 (01/19 0338) Resp:  [16] 16 (01/19 0338) BP: (103-136)/(55-68) 136/68 (01/19 0338) SpO2:  [96 %-99 %] 99 % (01/19 0338)  Intake/Output from previous day: 01/18 0701 - 01/19 0700 In: 1160 [P.O.:360; I.V.:800] Out: 52 [Urine:2; Blood:50]  LABS Recent Labs    11/10/17 1400  HGB 11.5*   Recent Labs    11/10/17 1400  WBC 7.0  RBC 3.90*  HCT 34.5*  PLT 120*   Recent Labs    11/10/17 1400  NA 135  K 4.2  CL 103  CO2 21*  BUN 15  CREATININE 1.06  GLUCOSE 99  CALCIUM 8.3*   Recent Labs    11/10/17 1400  INR 1.29     Physical Exam LUE  Dressing changed; wound pristine  Sling in place  Sens  Ax/R/M/U intact  Mot   Ax/ R/ PIN/ M/ AIN/ U intact  Brisk CR, Rad 2+  Assessment/Plan: 1 Day Post-Op Procedure(s) (LRB): OPEN REDUCTION INTERNAL FIXATION LEFT OLECRANON FRACTURE (Left) 1. PT/OT No extension against resistance 2. Sling when out of bed 3. Soap and water only (no ointments, alcohol, or hydrogen peroxide) 4. F/u 10-14 days  Altamese Roe, MD [facility] [phone] (p)

## 2017-11-12 NOTE — Progress Notes (Signed)
Called report to [place] at [place], pt will be leaving with sister by private car.

## 2017-11-12 NOTE — Care Management Note (Signed)
Case Management Note  Patient Details  Name: FARREN NELLES MRN: [id number] Date of Birth: [birth year]  Subjective/Objective:                 Patient with order to return to facility admitted from. CM notified CSW. No other CM needs. CSW to facilitate DC.    Action/Plan:   Expected Discharge Date:  11/12/17               Expected Discharge Plan:  Assisted Living / Rest Home  In-House Referral:  Clinical Social Work  Discharge planning Services     Post Acute Care Choice:    Choice offered to:     DME Arranged:    DME Agency:     HH Arranged:    [place] Agency:     Status of Service:  Completed, signed off  If discussed at [employer], dates discussed:    Additional Comments:  Carles Collet, RN 11/12/2017, 12:45 PM

## 2017-11-12 NOTE — Progress Notes (Addendum)
CSW following for discharge plan. Patient is recent readmission, with full assessment completed on 09/13/17. Patient is long term resident at [facility] and is ready to go back.  Discharge to: [facility] Anticipated discharge date: 11/12/17 Transportation by: Family car  Report #: [phone], Room 105  [place] signing off.  Laveda Abbe LCSW [phone]

## 2017-11-12 NOTE — Discharge Instructions (Signed)
Orthopaedic Trauma Service Discharge Instructions   General Discharge Instructions  WEIGHT BEARING STATUS: Nonweightbearing Left upper extremity   RANGE OF MOTION/ACTIVITY: No active extension of Left elbow against resistance. Do not push up with left arm to get up from seated position. To not lift with L arm. Sling on when walking. Sling can be off in bed or when seated Move fingers, wrist and forearm as much as possible Gentle elbow flexion, passive extension of elbow (either by self or therapist)  Wound Care: daily wound care starting on 11/14/2017, see below Discharge Wound Care Instructions  Do NOT apply any ointments, solutions or lotions to pin sites or surgical wounds.  These prevent needed drainage and even though solutions like hydrogen peroxide kill bacteria, they also damage cells lining the pin sites that help fight infection.  Applying lotions or ointments can keep the wounds moist and can cause them to breakdown and open up as well. This can increase the risk for infection. When in doubt call the office.  Surgical incisions should be dressed daily.  If any drainage is noted, use one layer of adaptic, then gauze, Kerlix, and an ace wrap.  Once the incision is completely dry and without drainage, it may be left open to air out.  Showering may begin 36-48 hours later.  Cleaning gently with soap and water.  Traumatic wounds should be dressed daily as well.    One layer of adaptic, gauze, Kerlix, then ace wrap.  The adaptic can be discontinued once the draining has ceased    If you have a wet to dry dressing: wet the gauze with saline the squeeze as much saline out so the gauze is moist (not soaking wet), place moistened gauze over wound, then place a dry gauze over the moist one, followed by Kerlix wrap, then ace wrap.  Diet: as you were eating previously.  Can use over the counter stool softeners and bowel preparations, such as Miralax, to help with bowel movements.  Narcotics  can be constipating.  Be sure to drink plenty of fluids  PAIN MEDICATION USE AND EXPECTATIONS  You have likely been given narcotic medications to help control your pain.  After a traumatic event that results in an fracture (broken bone) with or without surgery, it is ok to use narcotic pain medications to help control one's pain.  We understand that everyone responds to pain differently and each individual patient will be evaluated on a regular basis for the continued need for narcotic medications. Ideally, narcotic medication use should last no more than 6-8 weeks (coinciding with fracture healing).   As a patient it is your responsibility as well to monitor narcotic medication use and report the amount and frequency you use these medications when you come to your office visit.   We would also advise that if you are using narcotic medications, you should take a dose prior to therapy to maximize you participation.  IF YOU ARE ON NARCOTIC MEDICATIONS IT IS NOT PERMISSIBLE TO OPERATE A MOTOR VEHICLE (MOTORCYCLE/CAR/TRUCK/MOPED) OR HEAVY MACHINERY DO NOT MIX NARCOTICS WITH OTHER CNS (CENTRAL NERVOUS SYSTEM) DEPRESSANTS SUCH AS ALCOHOL   STOP SMOKING OR USING NICOTINE PRODUCTS!!!!  As discussed nicotine severely impairs your body's ability to heal surgical and traumatic wounds but also impairs bone healing.  Wounds and bone heal by forming microscopic blood vessels (angiogenesis) and nicotine is a vasoconstrictor (essentially, shrinks blood vessels).  Therefore, if vasoconstriction occurs to these microscopic blood vessels they essentially disappear and are unable  to deliver necessary nutrients to the healing tissue.  This is one modifiable factor that you can do to dramatically increase your chances of healing your injury.    (This means no smoking, no nicotine gum, patches, etc)  DO NOT USE NONSTEROIDAL ANTI-INFLAMMATORY DRUGS (NSAID'S)  Using products such as Advil (ibuprofen), Aleve (naproxen),  Motrin (ibuprofen) for additional pain control during fracture healing can delay and/or prevent the healing response.  If you would like to take over the counter (OTC) medication, Tylenol (acetaminophen) is ok.  However, some narcotic medications that are given for pain control contain acetaminophen as well. Therefore, you should not exceed more than 4000 mg of tylenol in a day if you do not have liver disease.  Also note that there are may OTC medicines, such as cold medicines and allergy medicines that my contain tylenol as well.  If you have any questions about medications and/or interactions please ask your doctor/PA or your pharmacist.      ICE AND ELEVATE INJURED/OPERATIVE EXTREMITY  Using ice and elevating the injured extremity above your heart can help with swelling and pain control.  Icing in a pulsatile fashion, such as 20 minutes on and 20 minutes off, can be followed.    Do not place ice directly on skin. Make sure there is a barrier between to skin and the ice pack.    Using frozen items such as frozen peas works well as the conform nicely to the are that needs to be iced.  USE AN ACE WRAP OR TED HOSE FOR SWELLING CONTROL  In addition to icing and elevation, Ace wraps or TED hose are used to help limit and resolve swelling.  It is recommended to use Ace wraps or TED hose until you are informed to stop.    When using Ace Wraps start the wrapping distally (farthest away from the body) and wrap proximally (closer to the body)   Example: If you had surgery on your leg or thing and you do not have a splint on, start the ace wrap at the toes and work your way up to the thigh        If you had surgery on your upper extremity and do not have a splint on, start the ace wrap at your fingers and work your way up to the upper arm  IF YOU ARE IN A SPLINT OR CAST DO NOT [place]   If your splint gets wet for any reason please contact the office immediately. You may shower in your splint  or cast as long as you keep it dry.  This can be done by wrapping in a cast cover or garbage back (or similar)  Do Not stick any thing down your splint or cast such as pencils, money, or hangers to try and scratch yourself with.  If you feel itchy take benadryl as prescribed on the bottle for itching  IF YOU ARE IN A CAM BOOT (BLACK BOOT)  You may remove boot periodically. Perform daily dressing changes as noted below.  Wash the liner of the boot regularly and wear a sock when wearing the boot. It is recommended that you sleep in the boot until told otherwise  CALL THE OFFICE WITH ANY QUESTIONS OR CONCERNS: [phone]

## 2017-11-12 NOTE — Discharge Summary (Signed)
[facility]thopaedic [facility]uma Service (OTS)  Patient ID: NICHLAS PITERA MRN: [id number] DOB/AGE: October 30, 1945 72 y.o.  Admit date: 11/11/2017 Discharge date: 11/12/2017  Admission Diagnoses: Left olecranon fracture Cirrhosis Hepatitis C Hypertension GERD  Discharge Diagnoses:  Principal Problem:   Olecranon fracture, left, closed, initial encounter Active Problems:   Cirrhosis ([place])   Hepatitis C   HTN (hypertension)   GERD (gastroesophageal reflux disease)   Past Medical History:  Diagnosis Date  . Anemia    low platelets/liver disease  . Arthritis   . Cirrhosis ([place])   . GERD (gastroesophageal reflux disease)   . Hepatitis C    has been treated with Harvoni  . Hypertension   . Increased ammonia level   . Narcolepsy   . Peripheral vascular disease ([place])    DVT on left side  . Wears glasses   . Wears partial dentures    top partial     Procedures Performed: 11/11/2017-Dr. Haddix  CPT [phone] of left olecranon fracture    Discharged Condition: good  Hospital Course:   72 year old male with history of hepatitis C and cirrhosis who sustained a left olecranon fracture as a result of a fall.  He does have a history of DVT and on chronic anticoagulation his fracture was determined to be in need of surgical fixation.  Patient was brought in as an outpatient for fixation of his fracture.  Patient was operated on on 11/11/2017 where he had ORIF of his left olecranon.  Due to his medical history including cirrhosis hepatitis C and encephalopathy who was brought in overnight for observation.  He does live at an assisted living facility.  Patient did not have any issues overnight and was in the fantastic spirits on postoperative day #1.  Dressing change was performed on postoperative day #1 and his wound is stable.  Patient discharged in stable condition.  States he does not need any narcotics for pain control, he is very comfortable at present time.  He will be discharged with  Tylenol, LFTs are normal, will use very low-dose Tylenol as needed as well as Ultram for breakthrough pain.  Patient may resume his anticoagulation on return to his assisted living facility as well.  Patient should wear his sling when up.  May remove it when he is in bed or at rest.  No active extension of his left elbow against resistance.  Passive elbow extension and passive/active elbow flexion.  Unrestricted range of motion of his left forearm, wrist and hand  Dressing changes may start on 11/14/2017.  Soap and water only.  Dry dressing.  Ace wrap as needed for swelling control  Patient will follow up with orthopedics in 10 days  Consults: None  Significant Diagnostic Studies: labs:  Preoperative labs as below Results for MUHANNAD, BIGNELL (MRN [id number]) as of 11/12/2017 11:41  Ref. Range 11/10/2017 14:00  Sodium Latest Ref Range: 135 - 145 mmol/L 135  Potassium Latest Ref Range: 3.5 - 5.1 mmol/L 4.2  Chloride Latest Ref Range: 101 - 111 mmol/L 103  CO2 Latest Ref Range: 22 - 32 mmol/L 21 (L)  Glucose Latest Ref Range: 65 - 99 mg/dL 99  BUN Latest Ref Range: 6 - 20 mg/dL 15  Creatinine Latest Ref Range: 0.61 - 1.24 mg/dL 1.06  Calcium Latest Ref Range: 8.9 - 10.3 mg/dL 8.3 (L)  Anion gap Latest Ref Range: 5 - 15  11  Alkaline Phosphatase Latest Ref Range: 38 - 126 U/L 90  Albumin Latest Ref Range: 3.5 -  5.0 g/dL 2.8 (L)  AST Latest Ref Range: 15 - 41 U/L 45 (H)  ALT Latest Ref Range: 17 - 63 U/L 32  Total Protein Latest Ref Range: 6.5 - 8.1 g/dL 5.9 (L)  Total Bilirubin Latest Ref Range: 0.3 - 1.2 mg/dL 2.1 (H)  GFR, Est Non African American Latest Ref Range: >60 mL/min >60  GFR, Est African American Latest Ref Range: >60 mL/min >60  WBC Latest Ref Range: 4.0 - 10.5 K/uL 7.0  RBC Latest Ref Range: 4.22 - 5.81 MIL/uL 3.90 (L)  Hemoglobin Latest Ref Range: 13.0 - 17.0 g/dL 11.5 (L)  HCT Latest Ref Range: 39.0 - 52.0 % 34.5 (L)  MCV Latest Ref Range: 78.0 - 100.0 fL 88.5  MCH Latest  Ref Range: 26.0 - 34.0 pg 29.5  MCHC Latest Ref Range: 30.0 - 36.0 g/dL 33.3  RDW Latest Ref Range: 11.5 - 15.5 % 15.6 (H)  Platelets Latest Ref Range: 150 - 400 K/uL 120 (L)  Neutrophils Latest Units: % 68  Lymphocytes Latest Units: % 19  Monocytes Relative Latest Units: % 10  Eosinophil Latest Units: % 2  Basophil Latest Units: % 1  NEUT# Latest Ref Range: 1.7 - 7.7 K/uL 4.7  Lymphocyte # Latest Ref Range: 0.7 - 4.0 K/uL 1.4  Monocyte # Latest Ref Range: 0.1 - 1.0 K/uL 0.7  Eosinophils Absolute Latest Ref Range: 0.0 - 0.7 K/uL 0.1  Basophils Absolute Latest Ref Range: 0.0 - 0.1 K/uL 0.1  Prothrombin Time Latest Ref Range: 11.4 - 15.2 seconds 15.9 (H)  INR Unknown 1.29    Treatments: IV hydration, antibiotics: Ancef, analgesia: Morphine and Norco, therapies: OT and RN and surgery: As above  Discharge Exam:  Orthopaedic Trauma Service (OTS)   1 Day Post-Op Procedure(s) (LRB): OPEN REDUCTION INTERNAL FIXATION LEFT OLECRANON FRACTURE (Left)   Subjective: Patient reports pain as mild. Jovial spirits!   Objective: Current Vitals Blood pressure 136/68, pulse 76, temperature 98.4 F (36.9 C), temperature source Oral, resp. rate 16, weight 86.6 kg (191 lb), SpO2 99 %. Vital signs in last 24 hours: Temp:  [97.9 F (36.6 C)-98.4 F (36.9 C)] 98.4 F (36.9 C) (01/19 0338) Pulse Rate:  [69-77] 76 (01/19 0338) Resp:  [16] 16 (01/19 0338) BP: (103-136)/(55-68) 136/68 (01/19 0338) SpO2:  [96 %-99 %] 99 % (01/19 0338)   Intake/Output from previous day: 01/18 0701 - 01/19 0700 In: 1160 [P.O.:360; I.V.:800] Out: 52 [Urine:2; Blood:50]   LABS Recent Labs (last 2 labs)      Recent Labs    11/10/17 1400  HGB 11.5*      Recent Labs (last 2 labs)      Recent Labs    11/10/17 1400  WBC 7.0  RBC 3.90*  HCT 34.5*  PLT 120*      Recent Labs (last 2 labs)      Recent Labs    11/10/17 1400  NA 135  K 4.2  CL 103  CO2 21*  BUN 15  CREATININE 1.06  GLUCOSE 99   CALCIUM 8.3*      Recent Labs (last 2 labs)      Recent Labs    11/10/17 1400  INR 1.29          Physical Exam LUE     Dressing changed; wound pristine             Sling in place             Sens  Ax/R/M/U intact  Mot   Ax/ R/ PIN/ M/ AIN/ U intact             Brisk CR, Rad 2+   Assessment/Plan: 1 Day Post-Op Procedure(s) (LRB): OPEN REDUCTION INTERNAL FIXATION LEFT OLECRANON FRACTURE (Left) 1. PT/OT No extension against resistance 2. Sling when out of bed 3. Soap and water only (no ointments, alcohol, or hydrogen peroxide) 4. F/u 10-14 days   Altamese McGehee, MD [facility], [place] [phone] (p)   Disposition: 01-Home or Self Care-assisted living facility  Discharge Instructions    Call MD / Call 911   Complete by:  As directed    If you experience chest pain or shortness of breath, CALL 911 and be transported to the hospital emergency room.  If you develope a fever above 101 F, pus (white drainage) or increased drainage or redness at the wound, or calf pain, call your surgeon's office.   Constipation Prevention   Complete by:  As directed    Drink plenty of fluids.  Prune juice may be helpful.  You may use a stool softener, such as Colace (over the counter) 100 mg twice a day.  Use MiraLax (over the counter) for constipation as needed.   Diet - low sodium heart healthy   Complete by:  As directed    Discharge instructions   Complete by:  As directed    [facility]uma Service Discharge Instructions   General Discharge Instructions  WEIGHT BEARING STATUS: Nonweightbearing Left upper extremity   RANGE OF MOTION/ACTIVITY: No active extension of Left elbow against resistance. Do not push up with left arm to get up from seated position. To not lift with L arm. Sling on when walking. Sling can be off in bed or when seated Move fingers, wrist and forearm as much as possible Gentle elbow flexion, passive extension of elbow  (either by self or therapist)  Wound Care: daily wound care starting on 11/14/2017, see below Discharge Wound Care Instructions  Do NOT apply any ointments, solutions or lotions to pin sites or surgical wounds.  These prevent needed drainage and even though solutions like hydrogen peroxide kill bacteria, they also damage cells lining the pin sites that help fight infection.  Applying lotions or ointments can keep the wounds moist and can cause them to breakdown and open up as well. This can increase the risk for infection. When in doubt call the office.  Surgical incisions should be dressed daily.  If any drainage is noted, use one layer of adaptic, then gauze, Kerlix, and an ace wrap.  Once the incision is completely dry and without drainage, it may be left open to air out.  Showering may begin 36-48 hours later.  Cleaning gently with soap and water.  Traumatic wounds should be dressed daily as well.    One layer of adaptic, gauze, Kerlix, then ace wrap.  The adaptic can be discontinued once the draining has ceased    If you have a wet to dry dressing: wet the gauze with saline the squeeze as much saline out so the gauze is moist (not soaking wet), place moistened gauze over wound, then place a dry gauze over the moist one, followed by Kerlix wrap, then ace wrap.  Diet: as you were eating previously.  Can use over the counter stool softeners and bowel preparations, such as Miralax, to help with bowel movements.  Narcotics can be constipating.  Be sure to drink plenty of fluids  PAIN MEDICATION USE AND EXPECTATIONS  You have likely been given narcotic medications to help control your pain.  After a traumatic event that results in an fracture (broken bone) with or without surgery, it is ok to use narcotic pain medications to help control one's pain.  We understand that everyone responds to pain differently and each individual patient will be evaluated on a regular basis for the continued need for  narcotic medications. Ideally, narcotic medication use should last no more than 6-8 weeks (coinciding with fracture healing).   As a patient it is your responsibility as well to monitor narcotic medication use and report the amount and frequency you use these medications when you come to your office visit.   We would also advise that if you are using narcotic medications, you should take a dose prior to therapy to maximize you participation.  IF YOU ARE ON NARCOTIC MEDICATIONS IT IS NOT PERMISSIBLE TO OPERATE A MOTOR VEHICLE (MOTORCYCLE/CAR/TRUCK/MOPED) OR HEAVY MACHINERY DO NOT MIX NARCOTICS WITH OTHER CNS (CENTRAL NERVOUS SYSTEM) DEPRESSANTS SUCH AS ALCOHOL   STOP SMOKING OR USING NICOTINE PRODUCTS!!!!  As discussed nicotine severely impairs your body's ability to heal surgical and traumatic wounds but also impairs bone healing.  Wounds and bone heal by forming microscopic blood vessels (angiogenesis) and nicotine is a vasoconstrictor (essentially, shrinks blood vessels).  Therefore, if vasoconstriction occurs to these microscopic blood vessels they essentially disappear and are unable to deliver necessary nutrients to the healing tissue.  This is one modifiable factor that you can do to dramatically increase your chances of healing your injury.    (This means no smoking, no nicotine gum, patches, etc)  DO NOT USE NONSTEROIDAL ANTI-INFLAMMATORY DRUGS (NSAID'S)  Using products such as Advil (ibuprofen), Aleve (naproxen), Motrin (ibuprofen) for additional pain control during fracture healing can delay and/or prevent the healing response.  If you would like to take over the counter (OTC) medication, Tylenol (acetaminophen) is ok.  However, some narcotic medications that are given for pain control contain acetaminophen as well. Therefore, you should not exceed more than 4000 mg of tylenol in a day if you do not have liver disease.  Also note that there are may OTC medicines, such as cold medicines and  allergy medicines that my contain tylenol as well.  If you have any questions about medications and/or interactions please ask your doctor/PA or your pharmacist.      ICE AND ELEVATE INJURED/OPERATIVE EXTREMITY  Using ice and elevating the injured extremity above your heart can help with swelling and pain control.  Icing in a pulsatile fashion, such as 20 minutes on and 20 minutes off, can be followed.    Do not place ice directly on skin. Make sure there is a barrier between to skin and the ice pack.    Using frozen items such as frozen peas works well as the conform nicely to the are that needs to be iced.  USE AN ACE WRAP OR TED HOSE FOR SWELLING CONTROL  In addition to icing and elevation, Ace wraps or TED hose are used to help limit and resolve swelling.  It is recommended to use Ace wraps or TED hose until you are informed to stop.    When using Ace Wraps start the wrapping distally (farthest away from the body) and wrap proximally (closer to the body)   Example: If you had surgery on your leg or thing and you do not have a splint on, start the ace wrap at the toes and work your way up  to the thigh        If you had surgery on your upper extremity and do not have a splint on, start the ace wrap at your fingers and work your way up to the upper arm  IF YOU ARE IN A SPLINT OR CAST DO NOT REMOVE IT FOR ANY REASON   If your splint gets wet for any reason please contact the office immediately. You may shower in your splint or cast as long as you keep it dry.  This can be done by wrapping in a cast cover or garbage back (or similar)  Do Not stick any thing down your splint or cast such as pencils, money, or hangers to try and scratch yourself with.  If you feel itchy take benadryl as prescribed on the bottle for itching  IF YOU ARE IN A CAM BOOT (BLACK BOOT)  You may remove boot periodically. Perform daily dressing changes as noted below.  Wash the liner of the boot regularly and wear a sock when  wearing the boot. It is recommended that you sleep in the boot until told otherwise  CALL THE OFFICE WITH ANY QUESTIONS OR CONCERNS: [phone]   Increase activity slowly as tolerated   Complete by:  As directed    Lifting restrictions   Complete by:  As directed    No lifting with left arm until further notice   Non weight bearing   Complete by:  As directed    Laterality:  left   Extremity:  Upper     Allergies as of 11/12/2017      Reactions   Codeine Itching      Medication List    STOP taking these medications   HYDROcodone-acetaminophen 5-325 MG tablet Commonly known as:  NORCO/VICODIN   zolpidem 5 MG tablet Commonly known as:  AMBIEN     TAKE these medications   acetaminophen 325 MG tablet Commonly known as:  TYLENOL Take 1-2 tablets (325-650 mg total) by mouth every 8 (eight) hours as needed for moderate pain.   amphetamine-dextroamphetamine 10 MG tablet Commonly known as:  ADDERALL Take 20 mg by mouth daily.   bisacodyl 5 MG EC tablet Commonly known as:  DULCOLAX Take 5 mg by mouth daily as needed for moderate constipation.   bisacodyl 10 MG suppository Commonly known as:  DULCOLAX Place 10 mg as needed rectally for moderate constipation.   docusate sodium 100 MG capsule Commonly known as:  COLACE Take 100 mg 2 (two) times daily by mouth.   ELIQUIS STARTER PACK 5 MG Tabs Take as directed on package: start with two-5mg  tablets twice daily for 7 days. On day 8, switch to one-5mg  tablet twice daily.   furosemide 40 MG tablet Commonly known as:  LASIX Take 1 tablet (40 mg total) daily by mouth.   hydrocortisone cream 1 % Apply 1 application 2 (two) times daily as needed topically for itching. For back   hydrOXYzine 25 MG tablet Commonly known as:  ATARAX/VISTARIL Take 25 mg every 6 (six) hours as needed by mouth for itching.   lactulose 10 GM/15ML solution Commonly known as:  CHRONULAC Take 45 mLs (30 g total) 4 (four) times daily by mouth.  Titrate to have 3-4 BM's in a day   Melatonin 5 MG Tabs Take 5 mg at bedtime by mouth.   methocarbamol 500 MG tablet Commonly known as:  ROBAXIN Take 1 tablet (500 mg total) by mouth every 6 (six) hours as needed for muscle spasms.  MILK OF MAGNESIA CONCENTRATE PO Take 30 mLs daily as needed by mouth (constipation).   multivitamin with minerals tablet Take 1 tablet by mouth daily.   pantoprazole 40 MG tablet Commonly known as:  PROTONIX Take 40 mg by mouth daily.   PREVIDENT 0.2 % Soln Generic drug:  SODIUM FLUORIDE (DENTAL RINSE) Place 1 mL onto teeth See admin instructions. Rinse with small amount for one minute and then spit; do not eat, drink or rinse for 30 minutes after using   rifaximin 200 MG tablet Commonly known as:  XIFAXAN Take 2 tablets (400 mg total) by mouth 3 (three) times daily.   spironolactone 100 MG tablet Commonly known as:  ALDACTONE Take 1 tablet (100 mg total) by mouth daily. What changed:  when to take this   SYSTANE OP Place 1 drop into both eyes at bedtime.   traMADol 50 MG tablet Commonly known as:  ULTRAM Take 1 tablet (50 mg total) by mouth every 8 (eight) hours as needed for moderate pain or severe pain.            Discharge Care Instructions  (From admission, onward)        Start     Ordered   11/12/17 0000  Non weight bearing    Question Answer Comment  Laterality left   Extremity Upper      11/12/17 1149     Follow-up Information    Haddix, Thomasene Lot, MD. Schedule an appointment as soon as possible for a visit in 10 day(s).   Specialty:  Orthopedic Surgery Contact information: [address] [place] [address] [phone]            Signed:  Jari Pigg, PA-C [facility] [phone] (P) 11/12/2017, 11:50 AM

## 2017-11-12 NOTE — NC FL2 (Signed)
[place] LEVEL OF CARE SCREENING TOOL     IDENTIFICATION  Patient Name: Joshua [name] Birthdate: 03-21-1946 Sex: male Admission Date (Current Location): 11/11/2017  [facility] and [place] Number:  Herbalist and Address:  [facility], [place] [address], [place], [place] [address]      Provider Number: [id number]  Attending Physician Name and Address:  Shona Needles, MD  Relative Name and Phone Number:       Current Level of Care: Hospital Recommended Level of Care: [place] Prior Approval Number:    Date Approved/Denied:   PASRR Number: [id number]  Discharge Plan: SNF    Current Diagnoses: Patient Active Problem List   Diagnosis Date Noted  . Olecranon fracture, left, closed, initial encounter 11/11/2017  . Hypokalemia 09/12/2017  . Goals of care, counseling/discussion   . Palliative care by specialist   . Altered mental status 08/03/2017  . Nonhealing nonsurgical wound 08/03/2017  . AKI (acute kidney injury) ([place]) 08/03/2017  . GERD (gastroesophageal reflux disease) 08/03/2017  . Acute hepatic encephalopathy 07/18/2017  . Hyponatremia 07/18/2017  . Pulmonary nodules 07/18/2017  . Hepatic encephalopathy ([place]) 07/18/2017  . Hyperammonemia ([place]) 05/22/2014  . Acute encephalopathy 05/22/2014  . Renal failure (ARF), acute on chronic ([place]) 05/22/2014  . Cellulitis 02/17/2014  . Cirrhosis ([place]) 02/17/2014  . Hepatitis C 02/17/2014  . HTN (hypertension) 02/17/2014    Orientation RESPIRATION BLADDER Height & Weight     Self, Time, Situation, Place  Normal Continent Weight: 191 lb (86.6 kg) Height:     BEHAVIORAL SYMPTOMS/MOOD NEUROLOGICAL BOWEL NUTRITION STATUS      Continent Diet(heart healthy)  AMBULATORY STATUS COMMUNICATION OF NEEDS Skin   Limited Assist Verbally Surgical wounds, Skin abrasions(closed left elbow, compression wrap; open left leg wound, gauze dressing)                        Personal Care Assistance Level of Assistance  Bathing, Feeding, Dressing Bathing Assistance: Limited assistance Feeding assistance: Independent Dressing Assistance: Limited assistance     Functional Limitations Info  Sight, Hearing, Speech Sight Info: Adequate Hearing Info: Adequate Speech Info: Adequate    SPECIAL CARE FACTORS FREQUENCY                       Contractures Contractures Info: Not present    Additional Factors Info  Code Status, Allergies, Psychotropic Code Status Info: Full Allergies Info: Codeine Psychotropic Info: Adderall 20mg  daily         Current Medications (11/12/2017):  This is the current hospital active medication list Current Facility-Administered Medications  Medication Dose Route Frequency Provider Last Rate Last Dose  . acetaminophen (TYLENOL) tablet 650 mg  650 mg Oral Q6H PRN Haddix, Thomasene Lot, MD       Or  . acetaminophen (TYLENOL) suppository 650 mg  650 mg Rectal Q6H PRN Haddix, Thomasene Lot, MD      . amphetamine-dextroamphetamine (ADDERALL) tablet 20 mg  20 mg Oral Daily Haddix, Thomasene Lot, MD   20 mg at 11/12/17 0927  . bisacodyl (DULCOLAX) EC tablet 5 mg  5 mg Oral Daily PRN Haddix, Thomasene Lot, MD      . bisacodyl (DULCOLAX) suppository 10 mg  10 mg Rectal PRN Haddix, Thomasene Lot, MD      . docusate sodium (COLACE) capsule 100 mg  100 mg Oral BID Haddix, Thomasene Lot, MD   100 mg at 11/12/17  0932  . furosemide (LASIX) tablet 40 mg  40 mg Oral Daily Haddix, Thomasene Lot, MD   40 mg at 11/12/17 0927  . HYDROcodone-acetaminophen (NORCO/VICODIN) 5-325 MG per tablet 1-2 tablet  1-2 tablet Oral Q4H PRN Haddix, Thomasene Lot, MD   2 tablet at 11/11/17 2306  . hydrocortisone cream 1 % 1 application  1 application Topical BID PRN Haddix, Thomasene Lot, MD      . hydrOXYzine (ATARAX/VISTARIL) tablet 25 mg  25 mg Oral Q6H PRN Haddix, Thomasene Lot, MD      . lactated ringers infusion   Intravenous Continuous Montez Hageman, MD 10 mL/hr at 11/11/17 2208    . lactulose (CHRONULAC)  10 GM/15ML solution 30 g  30 g Oral QID Haddix, Thomasene Lot, MD   30 g at 11/11/17 2215  . magnesium hydroxide (MILK OF MAGNESIA) suspension   Oral Daily PRN Haddix, Thomasene Lot, MD      . Melatonin TABS 6 mg  6 mg Oral QHS Haddix, Thomasene Lot, MD   6 mg at 11/11/17 2209  . methocarbamol (ROBAXIN) tablet 500 mg  500 mg Oral Q6H PRN Haddix, Thomasene Lot, MD       Or  . methocarbamol (ROBAXIN) 500 mg in dextrose 5 % 50 mL IVPB  500 mg Intravenous Q6H PRN Haddix, Thomasene Lot, MD      . morphine 2 MG/ML injection 2 mg  2 mg Intravenous Q2H PRN Haddix, Thomasene Lot, MD   2 mg at 11/12/17 0050  . multivitamin with minerals tablet 1 tablet  1 tablet Oral Daily Haddix, Thomasene Lot, MD   1 tablet at 11/12/17 0927  . pantoprazole (PROTONIX) EC tablet 40 mg  40 mg Oral Daily Haddix, Thomasene Lot, MD   40 mg at 11/12/17 0928  . polyvinyl alcohol (LIQUIFILM TEARS) 1.4 % ophthalmic solution 1 drop  1 drop Both Eyes QHS Haddix, Thomasene Lot, MD   1 drop at 11/11/17 2209  . rifaximin (XIFAXAN) tablet 400 mg  400 mg Oral TID Shona Needles, MD   400 mg at 11/12/17 0927  . spironolactone (ALDACTONE) tablet 100 mg  100 mg Oral Daily Haddix, Thomasene Lot, MD   100 mg at 11/12/17 0240  . zolpidem (AMBIEN) tablet 5 mg  5 mg Oral QHS PRN Haddix, Thomasene Lot, MD         Discharge Medications: Please see discharge summary for a list of discharge medications.  Relevant Imaging Results:  Relevant Lab Results:   Additional Information SS#: [id number]  Geralynn Ochs, LCSW

## 2017-11-13 DIAGNOSIS — R278 Other lack of coordination: Secondary | ICD-10-CM | POA: Diagnosis not present

## 2017-11-13 DIAGNOSIS — Z4789 Encounter for other orthopedic aftercare: Secondary | ICD-10-CM | POA: Diagnosis not present

## 2017-11-13 DIAGNOSIS — R2689 Other abnormalities of gait and mobility: Secondary | ICD-10-CM | POA: Diagnosis not present

## 2017-11-13 DIAGNOSIS — S52025D Nondisplaced fracture of olecranon process without intraarticular extension of left ulna, subsequent encounter for closed fracture with routine healing: Secondary | ICD-10-CM | POA: Diagnosis not present

## 2017-11-13 DIAGNOSIS — R41841 Cognitive communication deficit: Secondary | ICD-10-CM | POA: Diagnosis not present

## 2017-11-13 DIAGNOSIS — B1920 Unspecified viral hepatitis C without hepatic coma: Secondary | ICD-10-CM | POA: Diagnosis not present

## 2017-11-13 DIAGNOSIS — K7290 Hepatic failure, unspecified without coma: Secondary | ICD-10-CM | POA: Diagnosis not present

## 2017-11-13 DIAGNOSIS — Z9181 History of falling: Secondary | ICD-10-CM | POA: Diagnosis not present

## 2017-11-13 DIAGNOSIS — M6281 Muscle weakness (generalized): Secondary | ICD-10-CM | POA: Diagnosis not present

## 2017-11-13 DIAGNOSIS — G9349 Other encephalopathy: Secondary | ICD-10-CM | POA: Diagnosis not present

## 2017-11-13 DIAGNOSIS — K219 Gastro-esophageal reflux disease without esophagitis: Secondary | ICD-10-CM | POA: Diagnosis not present

## 2017-11-13 DIAGNOSIS — I10 Essential (primary) hypertension: Secondary | ICD-10-CM | POA: Diagnosis not present

## 2017-11-14 DIAGNOSIS — K219 Gastro-esophageal reflux disease without esophagitis: Secondary | ICD-10-CM | POA: Diagnosis not present

## 2017-11-14 DIAGNOSIS — R278 Other lack of coordination: Secondary | ICD-10-CM | POA: Diagnosis not present

## 2017-11-14 DIAGNOSIS — R600 Localized edema: Secondary | ICD-10-CM | POA: Diagnosis not present

## 2017-11-14 DIAGNOSIS — M6281 Muscle weakness (generalized): Secondary | ICD-10-CM | POA: Diagnosis not present

## 2017-11-14 DIAGNOSIS — R2689 Other abnormalities of gait and mobility: Secondary | ICD-10-CM | POA: Diagnosis not present

## 2017-11-14 DIAGNOSIS — I10 Essential (primary) hypertension: Secondary | ICD-10-CM | POA: Diagnosis not present

## 2017-11-14 DIAGNOSIS — K7469 Other cirrhosis of liver: Secondary | ICD-10-CM | POA: Diagnosis not present

## 2017-11-14 DIAGNOSIS — R41841 Cognitive communication deficit: Secondary | ICD-10-CM | POA: Diagnosis not present

## 2017-11-14 DIAGNOSIS — G9349 Other encephalopathy: Secondary | ICD-10-CM | POA: Diagnosis not present

## 2017-11-14 DIAGNOSIS — S52202A Unspecified fracture of shaft of left ulna, initial encounter for closed fracture: Secondary | ICD-10-CM | POA: Diagnosis not present

## 2017-11-15 ENCOUNTER — Encounter (HOSPITAL_COMMUNITY): Payer: Self-pay | Admitting: Student

## 2017-11-16 DIAGNOSIS — S81801D Unspecified open wound, right lower leg, subsequent encounter: Secondary | ICD-10-CM | POA: Diagnosis not present

## 2017-11-16 DIAGNOSIS — S81802D Unspecified open wound, left lower leg, subsequent encounter: Secondary | ICD-10-CM | POA: Diagnosis not present

## 2017-11-21 DIAGNOSIS — S52022D Displaced fracture of olecranon process without intraarticular extension of left ulna, subsequent encounter for closed fracture with routine healing: Secondary | ICD-10-CM | POA: Diagnosis not present

## 2017-11-22 DIAGNOSIS — K7469 Other cirrhosis of liver: Secondary | ICD-10-CM | POA: Diagnosis not present

## 2017-11-22 DIAGNOSIS — S52202A Unspecified fracture of shaft of left ulna, initial encounter for closed fracture: Secondary | ICD-10-CM | POA: Diagnosis not present

## 2017-11-22 DIAGNOSIS — W19XXXD Unspecified fall, subsequent encounter: Secondary | ICD-10-CM | POA: Diagnosis not present

## 2017-11-22 DIAGNOSIS — K219 Gastro-esophageal reflux disease without esophagitis: Secondary | ICD-10-CM | POA: Diagnosis not present

## 2017-11-22 DIAGNOSIS — M25522 Pain in left elbow: Secondary | ICD-10-CM | POA: Diagnosis not present

## 2017-11-23 DIAGNOSIS — S81801D Unspecified open wound, right lower leg, subsequent encounter: Secondary | ICD-10-CM | POA: Diagnosis not present

## 2017-11-23 DIAGNOSIS — S81802D Unspecified open wound, left lower leg, subsequent encounter: Secondary | ICD-10-CM | POA: Diagnosis not present

## 2017-11-25 DIAGNOSIS — B182 Chronic viral hepatitis C: Secondary | ICD-10-CM | POA: Diagnosis not present

## 2017-11-25 DIAGNOSIS — L97211 Non-pressure chronic ulcer of right calf limited to breakdown of skin: Secondary | ICD-10-CM | POA: Diagnosis not present

## 2017-11-25 DIAGNOSIS — S52202A Unspecified fracture of shaft of left ulna, initial encounter for closed fracture: Secondary | ICD-10-CM | POA: Diagnosis not present

## 2017-11-25 DIAGNOSIS — R0982 Postnasal drip: Secondary | ICD-10-CM | POA: Diagnosis not present

## 2017-11-29 DIAGNOSIS — F909 Attention-deficit hyperactivity disorder, unspecified type: Secondary | ICD-10-CM | POA: Diagnosis not present

## 2017-11-29 DIAGNOSIS — L299 Pruritus, unspecified: Secondary | ICD-10-CM | POA: Diagnosis not present

## 2017-11-29 DIAGNOSIS — K219 Gastro-esophageal reflux disease without esophagitis: Secondary | ICD-10-CM | POA: Diagnosis not present

## 2017-11-29 DIAGNOSIS — K7469 Other cirrhosis of liver: Secondary | ICD-10-CM | POA: Diagnosis not present

## 2017-11-30 DIAGNOSIS — S81802D Unspecified open wound, left lower leg, subsequent encounter: Secondary | ICD-10-CM | POA: Diagnosis not present

## 2017-11-30 DIAGNOSIS — S81801D Unspecified open wound, right lower leg, subsequent encounter: Secondary | ICD-10-CM | POA: Diagnosis not present

## 2017-12-01 DIAGNOSIS — K7469 Other cirrhosis of liver: Secondary | ICD-10-CM | POA: Diagnosis not present

## 2017-12-01 DIAGNOSIS — R4182 Altered mental status, unspecified: Secondary | ICD-10-CM | POA: Diagnosis not present

## 2017-12-01 DIAGNOSIS — S52202A Unspecified fracture of shaft of left ulna, initial encounter for closed fracture: Secondary | ICD-10-CM | POA: Diagnosis not present

## 2017-12-01 DIAGNOSIS — W19XXXD Unspecified fall, subsequent encounter: Secondary | ICD-10-CM | POA: Diagnosis not present

## 2017-12-02 DIAGNOSIS — Z79899 Other long term (current) drug therapy: Secondary | ICD-10-CM | POA: Diagnosis not present

## 2017-12-02 DIAGNOSIS — I10 Essential (primary) hypertension: Secondary | ICD-10-CM | POA: Diagnosis not present

## 2017-12-02 DIAGNOSIS — E708 Other disorders of aromatic amino-acid metabolism: Secondary | ICD-10-CM | POA: Diagnosis not present

## 2017-12-03 DIAGNOSIS — H2513 Age-related nuclear cataract, bilateral: Secondary | ICD-10-CM | POA: Diagnosis not present

## 2017-12-06 DIAGNOSIS — R296 Repeated falls: Secondary | ICD-10-CM | POA: Diagnosis not present

## 2017-12-06 DIAGNOSIS — R200 Anesthesia of skin: Secondary | ICD-10-CM | POA: Diagnosis not present

## 2017-12-06 DIAGNOSIS — K7290 Hepatic failure, unspecified without coma: Secondary | ICD-10-CM | POA: Diagnosis not present

## 2017-12-06 DIAGNOSIS — K7469 Other cirrhosis of liver: Secondary | ICD-10-CM | POA: Diagnosis not present

## 2017-12-07 DIAGNOSIS — S81802D Unspecified open wound, left lower leg, subsequent encounter: Secondary | ICD-10-CM | POA: Diagnosis not present

## 2017-12-07 DIAGNOSIS — S81801D Unspecified open wound, right lower leg, subsequent encounter: Secondary | ICD-10-CM | POA: Diagnosis not present

## 2017-12-14 DIAGNOSIS — S81801D Unspecified open wound, right lower leg, subsequent encounter: Secondary | ICD-10-CM | POA: Diagnosis not present

## 2017-12-14 DIAGNOSIS — S81802D Unspecified open wound, left lower leg, subsequent encounter: Secondary | ICD-10-CM | POA: Diagnosis not present

## 2017-12-15 DIAGNOSIS — Z7901 Long term (current) use of anticoagulants: Secondary | ICD-10-CM | POA: Diagnosis not present

## 2017-12-15 DIAGNOSIS — I4891 Unspecified atrial fibrillation: Secondary | ICD-10-CM | POA: Diagnosis not present

## 2017-12-19 ENCOUNTER — Emergency Department (HOSPITAL_COMMUNITY)
Admission: EM | Admit: 2017-12-19 | Discharge: 2017-12-19 | Disposition: A | Discharge status: Home / Self Care | Payer: Medicare Other | Attending: Emergency Medicine | Admitting: Emergency Medicine

## 2017-12-19 ENCOUNTER — Emergency Department (HOSPITAL_COMMUNITY): Payer: Medicare Other

## 2017-12-19 ENCOUNTER — Other Ambulatory Visit: Payer: Self-pay

## 2017-12-19 ENCOUNTER — Encounter (HOSPITAL_COMMUNITY): Payer: Self-pay

## 2017-12-19 DIAGNOSIS — Z87891 Personal history of nicotine dependence: Secondary | ICD-10-CM | POA: Insufficient documentation

## 2017-12-19 DIAGNOSIS — S0101XA Laceration without foreign body of scalp, initial encounter: Secondary | ICD-10-CM | POA: Diagnosis not present

## 2017-12-19 DIAGNOSIS — B182 Chronic viral hepatitis C: Secondary | ICD-10-CM | POA: Diagnosis not present

## 2017-12-19 DIAGNOSIS — W01190A Fall on same level from slipping, tripping and stumbling with subsequent striking against furniture, initial encounter: Secondary | ICD-10-CM | POA: Diagnosis not present

## 2017-12-19 DIAGNOSIS — R42 Dizziness and giddiness: Secondary | ICD-10-CM | POA: Diagnosis not present

## 2017-12-19 DIAGNOSIS — S0181XA Laceration without foreign body of other part of head, initial encounter: Secondary | ICD-10-CM | POA: Diagnosis not present

## 2017-12-19 DIAGNOSIS — W19XXXA Unspecified fall, initial encounter: Secondary | ICD-10-CM

## 2017-12-19 DIAGNOSIS — Y9389 Activity, other specified: Secondary | ICD-10-CM | POA: Diagnosis not present

## 2017-12-19 DIAGNOSIS — I10 Essential (primary) hypertension: Secondary | ICD-10-CM | POA: Insufficient documentation

## 2017-12-19 DIAGNOSIS — Y999 Unspecified external cause status: Secondary | ICD-10-CM | POA: Diagnosis not present

## 2017-12-19 DIAGNOSIS — S0993XA Unspecified injury of face, initial encounter: Secondary | ICD-10-CM | POA: Diagnosis present

## 2017-12-19 DIAGNOSIS — Z79899 Other long term (current) drug therapy: Secondary | ICD-10-CM | POA: Insufficient documentation

## 2017-12-19 DIAGNOSIS — W19XXXD Unspecified fall, subsequent encounter: Secondary | ICD-10-CM | POA: Diagnosis not present

## 2017-12-19 DIAGNOSIS — R51 Headache: Secondary | ICD-10-CM | POA: Diagnosis not present

## 2017-12-19 DIAGNOSIS — Y92129 Unspecified place in nursing home as the place of occurrence of the external cause: Secondary | ICD-10-CM | POA: Insufficient documentation

## 2017-12-19 DIAGNOSIS — S0180XA Unspecified open wound of other part of head, initial encounter: Secondary | ICD-10-CM | POA: Diagnosis not present

## 2017-12-19 DIAGNOSIS — Z7901 Long term (current) use of anticoagulants: Secondary | ICD-10-CM | POA: Insufficient documentation

## 2017-12-19 DIAGNOSIS — S0121XA Laceration without foreign body of nose, initial encounter: Secondary | ICD-10-CM | POA: Diagnosis not present

## 2017-12-19 DIAGNOSIS — K7469 Other cirrhosis of liver: Secondary | ICD-10-CM | POA: Diagnosis not present

## 2017-12-19 DIAGNOSIS — S0990XA Unspecified injury of head, initial encounter: Secondary | ICD-10-CM | POA: Diagnosis not present

## 2017-12-19 LAB — BASIC METABOLIC PANEL
ANION GAP: 9 (ref 5–15)
BUN: 14 mg/dL (ref 6–20)
CO2: 25 mmol/L (ref 22–32)
Calcium: 8.5 mg/dL — ABNORMAL LOW (ref 8.9–10.3)
Chloride: 99 mmol/L — ABNORMAL LOW (ref 101–111)
Creatinine, Ser: 0.76 mg/dL (ref 0.61–1.24)
GFR calc non Af Amer: >60 mL/min (ref >60)
GLUCOSE: 104 mg/dL — AB (ref 65–99)
POTASSIUM: 4.2 mmol/L (ref 3.5–5.1)
Sodium: 133 mmol/L — ABNORMAL LOW (ref 135–145)

## 2017-12-19 LAB — I-STAT CHEM 8, ED
BUN: 16 mg/dL (ref 6–20)
Calcium, Ion: 1.13 mmol/L — ABNORMAL LOW (ref 1.15–1.40)
Chloride: 98 mmol/L — ABNORMAL LOW (ref 101–111)
Creatinine, Ser: 0.9 mg/dL (ref 0.61–1.24)
Glucose, Bld: 101 mg/dL — ABNORMAL HIGH (ref 65–99)
HCT: 31 % — ABNORMAL LOW (ref 39.0–52.0)
Hemoglobin: 10.5 g/dL — ABNORMAL LOW (ref 13.0–17.0)
Potassium: 4.2 mmol/L (ref 3.5–5.1)
Sodium: 136 mmol/L (ref 135–145)
TCO2: 27 mmol/L (ref 22–32)

## 2017-12-19 LAB — CBC
HEMATOCRIT: 31.7 % — AB (ref 39.0–52.0)
HEMOGLOBIN: 10.2 g/dL — AB (ref 13.0–17.0)
MCH: 27.6 pg (ref 26.0–34.0)
MCHC: 32.2 g/dL (ref 30.0–36.0)
MCV: 85.7 fL (ref 78.0–100.0)
Platelets: DECREASED 10*3/uL (ref 150–400)
RBC: 3.7 MIL/uL — AB (ref 4.22–5.81)
RDW: 15.9 % — ABNORMAL HIGH (ref 11.5–15.5)
WBC: 3.6 10*3/uL — ABNORMAL LOW (ref 4.0–10.5)

## 2017-12-19 LAB — PROTIME-INR
INR: 1.58
Prothrombin Time: 18.7 seconds — ABNORMAL HIGH (ref 11.4–15.2)

## 2017-12-19 MED ORDER — LIDOCAINE-EPINEPHRINE (PF) 2 %-1:200000 IJ SOLN
20.0000 mL | Freq: Once | INTRAMUSCULAR | Status: AC
  Administered 2017-12-19: 20 mL
  Filled 2017-12-19: qty 20

## 2017-12-19 NOTE — ED Provider Notes (Signed)
Suture repair of forehead and nose performed by me at the request of Dr. Hillard Danker  LACERATION REPAIR Performed by: Joshua [name] Authorized by: Joshua [name] Consent: Verbal consent obtained. Risks and benefits: risks, benefits and alternatives were discussed Consent given by: patient Patient identity confirmed: provided demographic data Prepped and Draped in normal sterile fashion Wound explored  Laceration Location: forehead  Laceration Length: 3cm  No Foreign Bodies seen or palpated  Anesthesia: local infiltration  Local anesthetic: lidocaine 2% w epinephrine  Anesthetic total: 3 ml  Irrigation method: syringe Amount of cleaning: standard  Skin closure: prolene 5.0  Number of sutures: 5  Technique: simple interrupted  Patient tolerance: Patient tolerated the procedure well with no immediate complications.    Joshua Moras, PA-C 12/19/17 1508    Dorie Rank, MD 12/20/17 1536

## 2017-12-19 NOTE — ED Notes (Signed)
ED Provider at bedside. 

## 2017-12-19 NOTE — ED Provider Notes (Signed)
[facility] EMERGENCY DEPARTMENT Provider Note   CSN: [id number] Arrival date & time: 12/19/17  1322     History   Chief Complaint Chief Complaint  Patient presents with  . Fall    HPI Joshua [name] is a 72 y.o. male.  HPI Patient presents to the emergency room for evaluation after a fall.  Patient is a resident of a nursing facility.  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  Patient states he was walking when he slipped on the polished floor.  Patient states he fell forward striking his face on some furniture.  He is having pain in his face and head where the impact occurred.  Denies any loss of consciousness.  He denies any chest pain or shortness of breath.  No numbness or weakness.  .  No neck pain.  He did sustain lacerations to his forehead and his nose.  He was sent to the ED for further evaluation. Past Medical History:  Diagnosis Date  . Anemia    low platelets/liver disease  . Arthritis   . Cirrhosis ([place])   . GERD (gastroesophageal reflux disease)   . Hepatitis C    has been treated with Harvoni  . Hypertension   . Increased ammonia level   . Narcolepsy   . Peripheral vascular disease ([place])    DVT on left side  . Wears glasses   . Wears partial dentures    top partial    Patient Active Problem List   Diagnosis Date Noted  . Olecranon fracture, left, closed, initial encounter 11/11/2017  . Hypokalemia 09/12/2017  . Goals of care, counseling/discussion   . Palliative care by specialist   . Altered mental status 08/03/2017  . Nonhealing nonsurgical wound 08/03/2017  . AKI (acute kidney injury) ([place]) 08/03/2017  . GERD (gastroesophageal reflux disease) 08/03/2017  . Acute hepatic encephalopathy 07/18/2017  . Hyponatremia 07/18/2017  . Pulmonary nodules 07/18/2017  . Hepatic encephalopathy ([place]) 07/18/2017  . Hyperammonemia ([place]) 05/22/2014  . Acute encephalopathy 05/22/2014  . Renal failure (ARF), acute on chronic ([place])  05/22/2014  . Cellulitis 02/17/2014  . Cirrhosis ([place]) 02/17/2014  . Hepatitis C 02/17/2014  . HTN (hypertension) 02/17/2014    Past Surgical History:  Procedure Laterality Date  . APPENDECTOMY    . COLONOSCOPY    . EYE SURGERY     both cataracts  . ORIF ELBOW FRACTURE Left 11/11/2017   Procedure: OPEN REDUCTION INTERNAL FIXATION LEFT OLECRANON FRACTURE;  Surgeon: Shona Needles, MD;  Location: [facility];  Service: Orthopedics;  Laterality: Left;  . PILONIDAL CYST EXCISION  1960  . SHOULDER ARTHROSCOPY WITH SUBACROMIAL DECOMPRESSION, ROTATOR CUFF REPAIR AND BICEP TENDON REPAIR Left 08/09/2013   Procedure: LEFT SHOULDER ARTHROSCOPY WITH SUBACROMIAL DECOMPRESSION, DISTAL CLAVICULECTOMY;  Surgeon: Ninetta Lights, MD;  Location: [facility];  Service: Orthopedics;  Laterality: Left;  . TONSILLECTOMY    . UPPER GI ENDOSCOPY         Home Medications    Prior to Admission medications   Medication Sig Start Date End Date Taking? Authorizing Provider  acetaminophen (TYLENOL) 325 MG tablet Take 1-2 tablets (325-650 mg total) by mouth every 8 (eight) hours as needed for moderate pain. 11/12/17 11/12/18  Ainsley Spinner, PA-C  amphetamine-dextroamphetamine (ADDERALL) 10 MG tablet Take 20 mg by mouth daily.  06/10/12   [provider]  bisacodyl (DULCOLAX) 10 MG suppository Place 10 mg as needed rectally for moderate constipation.    [provider]  bisacodyl (DULCOLAX) 5 MG EC tablet Take 5 mg by mouth daily as needed for moderate constipation.    [provider]  docusate sodium (COLACE) 100 MG capsule Take 100 mg 2 (two) times daily by mouth.    [provider]  ELIQUIS STARTER PACK (ELIQUIS STARTER PACK) 5 MG TABS Take as directed on package: start with two-5mg  tablets twice daily for 7 days. On day 8, switch to one-5mg  tablet twice daily. 10/17/17   Drenda Freeze, MD  furosemide (LASIX) 40 MG tablet Take 1 tablet (40 mg total) daily by mouth.  09/14/17   Rai, Ripudeep K, MD  hydrocortisone cream 1 % Apply 1 application 2 (two) times daily as needed topically for itching. For back    [provider]  hydrOXYzine (ATARAX/VISTARIL) 25 MG tablet Take 25 mg every 6 (six) hours as needed by mouth for itching.    [provider]  lactulose (CHRONULAC) 10 GM/15ML solution Take 45 mLs (30 g total) 4 (four) times daily by mouth. Titrate to have 3-4 BM's in a day 09/13/17   Rai, Ripudeep K, MD  Magnesium Hydroxide (MILK OF MAGNESIA CONCENTRATE PO) Take 30 mLs daily as needed by mouth (constipation).    [provider]  Melatonin 5 MG TABS Take 5 mg at bedtime by mouth.    [provider]  methocarbamol (ROBAXIN) 500 MG tablet Take 1 tablet (500 mg total) by mouth every 6 (six) hours as needed for muscle spasms. 11/12/17   Ainsley Spinner, PA-C  Multiple Vitamins-Minerals (MULTIVITAMIN WITH MINERALS) tablet Take 1 tablet by mouth daily.    [provider]  pantoprazole (PROTONIX) 40 MG tablet Take 40 mg by mouth daily. 06/28/17   [provider]  Polyethyl Glycol-Propyl Glycol (SYSTANE OP) Place 1 drop into both eyes at bedtime.    [provider]  rifaximin (XIFAXAN) 200 MG tablet Take 2 tablets (400 mg total) by mouth 3 (three) times daily. 08/07/17   Gherghe, Vella Redhead, MD  SODIUM FLUORIDE, DENTAL RINSE, (PREVIDENT) 0.2 % SOLN Place 1 mL onto teeth See admin instructions. Rinse with small amount for one minute and then spit; do not eat, drink or rinse for 30 minutes after using     [provider]  spironolactone (ALDACTONE) 100 MG tablet Take 1 tablet (100 mg total) by mouth daily. Patient taking differently: Take 100 mg 2 (two) times daily by mouth.  08/07/17   Gherghe, Vella Redhead, MD  traMADol (ULTRAM) 50 MG tablet Take 1 tablet (50 mg total) by mouth every 8 (eight) hours as needed for moderate pain or severe pain. 11/12/17   Ainsley Spinner, PA-C    Family History Family History    Problem Relation Age of Onset  . Cirrhosis Mother   . Cirrhosis Father   . CAD Neg Hx     Social History Social History   Tobacco Use  . Smoking status: Former Smoker    Packs/day: 1.00    Years: 45.00    Pack years: 45.00    Types: Cigarettes  . Smokeless tobacco: Never Used  Substance Use Topics  . Alcohol use: No    Frequency: Never    Comment: none now - lives in a SNF  . Drug use: No    Comment: in the past, none now     Allergies   Codeine   Review of Systems Review of Systems   Physical Exam Updated Vital Signs BP (!) 126/56 (BP Location:  Right Arm)   Pulse 73   Temp 98 F (36.7 C) (Oral)   Ht 1.651 m (5\' 5" )   Wt 78.9 kg (174 lb)   SpO2 100%   BMI 28.96 kg/m   Physical Exam  Constitutional: No distress.  Elderly, frail  HENT:  Head: Normocephalic.  Right Ear: External ear normal.  Left Ear: External ear normal.  Vertical laceration and an abrasion to the forehead, horizontal laceration to the bridge of the nose,  Eyes: Conjunctivae are normal. Right eye exhibits no discharge. Left eye exhibits no discharge. No scleral icterus.  Neck: Neck supple. No tracheal deviation present.  Cardiovascular: Normal rate, regular rhythm and intact distal pulses.  Pulmonary/Chest: Effort normal and breath sounds normal. No stridor. No respiratory distress. He has no wheezes. He has no rales.  Abdominal: Soft. Bowel sounds are normal. He exhibits no distension. There is no tenderness. There is no rebound and no guarding.  Musculoskeletal: He exhibits edema ( Bilateral lower extremities). He exhibits no tenderness.  Several superficial lacerations, abrasions on his hands  Neurological: He is alert. He has normal strength. No cranial nerve deficit (no facial droop, extraocular movements intact, no slurred speech) or sensory deficit. He exhibits normal muscle tone. He displays no seizure activity. Coordination normal.  Skin: Skin is warm and dry. No rash noted.   Psychiatric: He has a normal mood and affect.  Nursing note and vitals reviewed.    ED Treatments / Results  Labs (all labs ordered are listed, but only abnormal results are displayed) Labs Reviewed  PROTIME-INR - Abnormal; Notable for the following components:      Result Value   Prothrombin Time 18.7 (*)    All other components within normal limits  CBC - Abnormal; Notable for the following components:   WBC 3.6 (*)    RBC 3.70 (*)    Hemoglobin 10.2 (*)    HCT 31.7 (*)    RDW 15.9 (*)    All other components within normal limits  BASIC METABOLIC PANEL - Abnormal; Notable for the following components:   Sodium 133 (*)    Chloride 99 (*)    Glucose, Bld 104 (*)    Calcium 8.5 (*)    All other components within normal limits  I-STAT CHEM 8, ED - Abnormal; Notable for the following components:   Chloride 98 (*)    Glucose, Bld 101 (*)    Calcium, Ion 1.13 (*)    Hemoglobin 10.5 (*)    HCT 31.0 (*)    All other components within normal limits      Radiology Ct Head Wo Contrast  Result Date: 12/19/2017 CLINICAL DATA:  Status post fall. Denies pain and dizziness prior to fall. Laceration to head abrasion to nose. Pt on eliquis EXAM: CT HEAD WITHOUT CONTRAST CT MAXILLOFACIAL WITHOUT CONTRAST TECHNIQUE: Multidetector CT imaging of the head and maxillofacial structures were performed using the standard protocol without intravenous contrast. Multiplanar CT image reconstructions of the maxillofacial structures were also generated. COMPARISON:  None. FINDINGS: CT HEAD FINDINGS Brain: No evidence of acute infarction, hemorrhage, extra-axial collection, ventriculomegaly, or mass effect. 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 Generalized cerebral atrophy. Periventricular white matter low attenuation likely secondary to microangiopathy. Vascular: Cerebrovascular atherosclerotic calcifications are noted. Skull: Negative for fracture or focal lesion. Sinuses/Orbits:  Visualized portions of the orbits are unremarkable. Visualized portions of the paranasal sinuses are unremarkable. Visualized portions of the mastoid air cells are unremarkable. Other: None. CT MAXILLOFACIAL FINDINGS Osseous:  No fracture or mandibular dislocation. No destructive process. 3 mm anterolisthesis of C2 on C3 secondary to facet arthropathy. 2 mm retrolisthesis of C3 on C4. Broad-based disc osteophyte complex at C4-5 and C5-6. Bilateral uncovertebral degenerative changes at C5-6 resulting in severe foraminal narrowing. Orbits: Negative. No traumatic or inflammatory finding. Sinuses: Clear. Soft tissues: Nasal soft tissue swelling. IMPRESSION: 1. No acute intracranial pathology. 2. No acute osseous injury of the maxillofacial bones. Electronically Signed   By: Kathreen Devoid   On: 12/19/2017 14:53   Ct Maxillofacial Wo Contrast  Result Date: 12/19/2017 CLINICAL DATA:  Status post fall. Denies pain and dizziness prior to fall. Laceration to head abrasion to nose. Pt on eliquis EXAM: CT HEAD WITHOUT CONTRAST CT MAXILLOFACIAL WITHOUT CONTRAST TECHNIQUE: Multidetector CT imaging of the head and maxillofacial structures were performed using the standard protocol without intravenous contrast. Multiplanar CT image reconstructions of the maxillofacial structures were also generated. COMPARISON:  None. FINDINGS: CT HEAD FINDINGS Brain: No evidence of acute infarction, hemorrhage, extra-axial collection, ventriculomegaly, or mass effect. 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 Generalized cerebral atrophy. Periventricular white matter low attenuation likely secondary to microangiopathy. Vascular: Cerebrovascular atherosclerotic calcifications are noted. Skull: Negative for fracture or focal lesion. Sinuses/Orbits: Visualized portions of the orbits are unremarkable. Visualized portions of the paranasal sinuses are unremarkable. Visualized portions of the mastoid air cells are unremarkable. Other:  None. CT MAXILLOFACIAL FINDINGS Osseous: No fracture or mandibular dislocation. No destructive process. 3 mm anterolisthesis of C2 on C3 secondary to facet arthropathy. 2 mm retrolisthesis of C3 on C4. Broad-based disc osteophyte complex at C4-5 and C5-6. Bilateral uncovertebral degenerative changes at C5-6 resulting in severe foraminal narrowing. Orbits: Negative. No traumatic or inflammatory finding. Sinuses: Clear. Soft tissues: Nasal soft tissue swelling. IMPRESSION: 1. No acute intracranial pathology. 2. No acute osseous injury of the maxillofacial bones. Electronically Signed   By: Kathreen Devoid   On: 12/19/2017 14:53    Procedures Procedures (including critical care time)  Medications Ordered in ED Medications  lidocaine-EPINEPHrine (XYLOCAINE W/EPI) 2 %-1:200000 (PF) injection 20 mL (20 mLs Infiltration Given by Other 12/19/17 1447)     Initial Impression / Assessment and Plan / ED Course  I have reviewed the triage vital signs and the nursing notes.  Pertinent labs & imaging results that were available during my care of the patient were reviewed by me and considered in my medical decision making (see chart for details).   Patient presented to the emergency room after a chemical fall.  Laboratory tests are reassuring.  Patient has a mild anemia but this is not significantly changed from his baseline.  Electrolytes are unremarkable.  CT scans do not show any evidence of any serious injury.  Patient's lacerations were repaired by PA Rona Ravens.  He is stable for discharge back to his nursing facility  Final Clinical Impressions(s) / ED Diagnoses   Final diagnoses:  Fall, initial encounter  Laceration of scalp, initial encounter      Dorie Rank, MD 12/19/17 1546

## 2017-12-19 NOTE — ED Triage Notes (Signed)
Pt arrived via [facility] from [facility] [place]. R/t ground level fall. Denies pain and dizziness prior to fall. HA 6/10. Laceration to head abrasion to nose, hands, FA on eliquis.Recent falls.

## 2017-12-19 NOTE — Discharge Instructions (Signed)
Suture removal in 7 days

## 2017-12-20 DIAGNOSIS — S52022D Displaced fracture of olecranon process without intraarticular extension of left ulna, subsequent encounter for closed fracture with routine healing: Secondary | ICD-10-CM | POA: Diagnosis not present

## 2017-12-21 DIAGNOSIS — S81801D Unspecified open wound, right lower leg, subsequent encounter: Secondary | ICD-10-CM | POA: Diagnosis not present

## 2017-12-21 DIAGNOSIS — S81802D Unspecified open wound, left lower leg, subsequent encounter: Secondary | ICD-10-CM | POA: Diagnosis not present

## 2017-12-28 DIAGNOSIS — S81801D Unspecified open wound, right lower leg, subsequent encounter: Secondary | ICD-10-CM | POA: Diagnosis not present

## 2017-12-28 DIAGNOSIS — S81802D Unspecified open wound, left lower leg, subsequent encounter: Secondary | ICD-10-CM | POA: Diagnosis not present

## 2018-01-04 DIAGNOSIS — S81801D Unspecified open wound, right lower leg, subsequent encounter: Secondary | ICD-10-CM | POA: Diagnosis not present

## 2018-01-04 DIAGNOSIS — S81802D Unspecified open wound, left lower leg, subsequent encounter: Secondary | ICD-10-CM | POA: Diagnosis not present

## 2018-01-06 DIAGNOSIS — I872 Venous insufficiency (chronic) (peripheral): Secondary | ICD-10-CM | POA: Diagnosis not present

## 2018-01-06 DIAGNOSIS — I82409 Acute embolism and thrombosis of unspecified deep veins of unspecified lower extremity: Secondary | ICD-10-CM | POA: Diagnosis not present

## 2018-01-06 DIAGNOSIS — I890 Lymphedema, not elsewhere classified: Secondary | ICD-10-CM | POA: Diagnosis not present

## 2018-01-06 DIAGNOSIS — R2689 Other abnormalities of gait and mobility: Secondary | ICD-10-CM | POA: Diagnosis not present

## 2018-01-06 DIAGNOSIS — I10 Essential (primary) hypertension: Secondary | ICD-10-CM | POA: Diagnosis not present

## 2018-01-06 DIAGNOSIS — K7460 Unspecified cirrhosis of liver: Secondary | ICD-10-CM | POA: Diagnosis not present

## 2018-01-06 DIAGNOSIS — B1920 Unspecified viral hepatitis C without hepatic coma: Secondary | ICD-10-CM | POA: Diagnosis not present

## 2018-01-09 DIAGNOSIS — R531 Weakness: Secondary | ICD-10-CM | POA: Diagnosis not present

## 2018-01-11 DIAGNOSIS — S81802D Unspecified open wound, left lower leg, subsequent encounter: Secondary | ICD-10-CM | POA: Diagnosis not present

## 2018-01-11 DIAGNOSIS — S81801D Unspecified open wound, right lower leg, subsequent encounter: Secondary | ICD-10-CM | POA: Diagnosis not present

## 2018-01-18 DIAGNOSIS — F909 Attention-deficit hyperactivity disorder, unspecified type: Secondary | ICD-10-CM | POA: Diagnosis not present

## 2018-01-18 DIAGNOSIS — S81801D Unspecified open wound, right lower leg, subsequent encounter: Secondary | ICD-10-CM | POA: Diagnosis not present

## 2018-01-18 DIAGNOSIS — S81802D Unspecified open wound, left lower leg, subsequent encounter: Secondary | ICD-10-CM | POA: Diagnosis not present

## 2018-01-18 DIAGNOSIS — R600 Localized edema: Secondary | ICD-10-CM | POA: Diagnosis not present

## 2018-01-18 DIAGNOSIS — K7469 Other cirrhosis of liver: Secondary | ICD-10-CM | POA: Diagnosis not present

## 2018-01-18 DIAGNOSIS — B182 Chronic viral hepatitis C: Secondary | ICD-10-CM | POA: Diagnosis not present

## 2018-01-24 DIAGNOSIS — K219 Gastro-esophageal reflux disease without esophagitis: Secondary | ICD-10-CM | POA: Diagnosis not present

## 2018-01-24 DIAGNOSIS — K7290 Hepatic failure, unspecified without coma: Secondary | ICD-10-CM | POA: Diagnosis not present

## 2018-01-24 DIAGNOSIS — R600 Localized edema: Secondary | ICD-10-CM | POA: Diagnosis not present

## 2018-01-24 DIAGNOSIS — M79671 Pain in right foot: Secondary | ICD-10-CM | POA: Diagnosis not present

## 2018-01-25 DIAGNOSIS — S81802D Unspecified open wound, left lower leg, subsequent encounter: Secondary | ICD-10-CM | POA: Diagnosis not present

## 2018-01-25 DIAGNOSIS — S81801D Unspecified open wound, right lower leg, subsequent encounter: Secondary | ICD-10-CM | POA: Diagnosis not present

## 2018-01-31 DIAGNOSIS — S52022D Displaced fracture of olecranon process without intraarticular extension of left ulna, subsequent encounter for closed fracture with routine healing: Secondary | ICD-10-CM | POA: Diagnosis not present

## 2018-02-02 DIAGNOSIS — S81802D Unspecified open wound, left lower leg, subsequent encounter: Secondary | ICD-10-CM | POA: Diagnosis not present

## 2018-02-02 DIAGNOSIS — S81801D Unspecified open wound, right lower leg, subsequent encounter: Secondary | ICD-10-CM | POA: Diagnosis not present

## 2018-02-07 DIAGNOSIS — R21 Rash and other nonspecific skin eruption: Secondary | ICD-10-CM | POA: Diagnosis not present

## 2018-02-07 DIAGNOSIS — L97211 Non-pressure chronic ulcer of right calf limited to breakdown of skin: Secondary | ICD-10-CM | POA: Diagnosis not present

## 2018-02-07 DIAGNOSIS — L97228 Non-pressure chronic ulcer of left calf with other specified severity: Secondary | ICD-10-CM | POA: Diagnosis not present

## 2018-02-07 DIAGNOSIS — B182 Chronic viral hepatitis C: Secondary | ICD-10-CM | POA: Diagnosis not present

## 2018-02-08 DIAGNOSIS — S81801D Unspecified open wound, right lower leg, subsequent encounter: Secondary | ICD-10-CM | POA: Diagnosis not present

## 2018-02-08 DIAGNOSIS — S81802D Unspecified open wound, left lower leg, subsequent encounter: Secondary | ICD-10-CM | POA: Diagnosis not present

## 2018-02-13 ENCOUNTER — Telehealth: Payer: Self-pay | Admitting: Gastroenterology

## 2018-02-13 NOTE — Telephone Encounter (Addendum)
We have received a referral from pt's PCP at [facility] facility on 02/10/18 am requesting pt being seen for evaluation of the liver. Since we were closed on Friday, referral was assigned to 02/13/18 am DOD Dr. Havery Moros. Pt's PCP is Dr. Lysle Rubens, contact # [phone]. Records have been placed on 02/13/18 am DOD Dr. Doyne Keel desk for review

## 2018-02-14 NOTE — Telephone Encounter (Signed)
Called Dr. Glenna Durand office to talk about this patient but I have not yet head back from him. Referral says "liver failure" and also he has been referred to hospice / palliative care. Need more information prior to scheduling a clinic to ensure this is appropriate. Thanks

## 2018-02-15 DIAGNOSIS — S81801D Unspecified open wound, right lower leg, subsequent encounter: Secondary | ICD-10-CM | POA: Diagnosis not present

## 2018-02-15 DIAGNOSIS — S81802D Unspecified open wound, left lower leg, subsequent encounter: Secondary | ICD-10-CM | POA: Diagnosis not present

## 2018-02-16 ENCOUNTER — Non-Acute Institutional Stay: Payer: Self-pay | Admitting: Hospice and Palliative Medicine

## 2018-02-16 DIAGNOSIS — Z515 Encounter for palliative care: Secondary | ICD-10-CM

## 2018-02-16 NOTE — Progress Notes (Signed)
Patient will be discharged from community palliative care following request of facility and family.

## 2018-02-17 DIAGNOSIS — I739 Peripheral vascular disease, unspecified: Secondary | ICD-10-CM | POA: Diagnosis not present

## 2018-02-20 DIAGNOSIS — Z79899 Other long term (current) drug therapy: Secondary | ICD-10-CM | POA: Diagnosis not present

## 2018-02-20 DIAGNOSIS — D649 Anemia, unspecified: Secondary | ICD-10-CM | POA: Diagnosis not present

## 2018-02-22 DIAGNOSIS — S81801D Unspecified open wound, right lower leg, subsequent encounter: Secondary | ICD-10-CM | POA: Diagnosis not present

## 2018-02-22 DIAGNOSIS — S81802D Unspecified open wound, left lower leg, subsequent encounter: Secondary | ICD-10-CM | POA: Diagnosis not present

## 2018-02-24 DIAGNOSIS — N183 Chronic kidney disease, stage 3 (moderate): Secondary | ICD-10-CM | POA: Diagnosis not present

## 2018-02-24 DIAGNOSIS — I872 Venous insufficiency (chronic) (peripheral): Secondary | ICD-10-CM | POA: Diagnosis not present

## 2018-02-24 DIAGNOSIS — F909 Attention-deficit hyperactivity disorder, unspecified type: Secondary | ICD-10-CM | POA: Diagnosis not present

## 2018-02-24 DIAGNOSIS — B1920 Unspecified viral hepatitis C without hepatic coma: Secondary | ICD-10-CM | POA: Diagnosis not present

## 2018-02-24 DIAGNOSIS — K7460 Unspecified cirrhosis of liver: Secondary | ICD-10-CM | POA: Diagnosis not present

## 2018-02-27 ENCOUNTER — Encounter: Payer: Self-pay | Admitting: Gastroenterology

## 2018-02-27 NOTE — Telephone Encounter (Signed)
Routed to Dr. Armbruster. 

## 2018-02-27 NOTE — Telephone Encounter (Signed)
[facility] calling wanting to follow up about referral. They are asking if we can reach out to [facility] again to see if we can get patient scheduled.

## 2018-02-27 NOTE — Telephone Encounter (Signed)
Patient has been scheduled for new patient appointment with Dr.Armbruster 04/25/18.

## 2018-02-27 NOTE — Telephone Encounter (Signed)
Please see Dr. Doyne Keel note and recommendations. Thank you in advance for scheduling him.

## 2018-02-27 NOTE — Telephone Encounter (Signed)
I previously spoke with Dr. Deforest Hoyles about his case and accepted this patient for a consultation and assistance with his management. He is no longer in hospice. I thought I had relayed that to the schedulers, I wrote that on his written packet. Sorry if any confusion about this. You can schedule him with first available with me or one of the APPs. Thanks

## 2018-03-01 DIAGNOSIS — S81801D Unspecified open wound, right lower leg, subsequent encounter: Secondary | ICD-10-CM | POA: Diagnosis not present

## 2018-03-01 DIAGNOSIS — S81802D Unspecified open wound, left lower leg, subsequent encounter: Secondary | ICD-10-CM | POA: Diagnosis not present

## 2018-03-02 DIAGNOSIS — R600 Localized edema: Secondary | ICD-10-CM | POA: Diagnosis not present

## 2018-03-02 DIAGNOSIS — K7469 Other cirrhosis of liver: Secondary | ICD-10-CM | POA: Diagnosis not present

## 2018-03-02 DIAGNOSIS — M79605 Pain in left leg: Secondary | ICD-10-CM | POA: Diagnosis not present

## 2018-03-02 DIAGNOSIS — M7989 Other specified soft tissue disorders: Secondary | ICD-10-CM | POA: Diagnosis not present

## 2018-03-02 DIAGNOSIS — Z86718 Personal history of other venous thrombosis and embolism: Secondary | ICD-10-CM | POA: Diagnosis not present

## 2018-03-03 ENCOUNTER — Emergency Department (HOSPITAL_COMMUNITY): Payer: Medicare Other

## 2018-03-03 ENCOUNTER — Inpatient Hospital Stay (HOSPITAL_COMMUNITY)
Admission: EM | Admit: 2018-03-03 | Discharge: 2018-03-25 | DRG: 871 | Disposition: E | Payer: Medicare Other | Attending: Pulmonary Disease | Admitting: Pulmonary Disease

## 2018-03-03 ENCOUNTER — Encounter (HOSPITAL_COMMUNITY): Payer: Self-pay | Admitting: Emergency Medicine

## 2018-03-03 DIAGNOSIS — I739 Peripheral vascular disease, unspecified: Secondary | ICD-10-CM | POA: Diagnosis present

## 2018-03-03 DIAGNOSIS — Z7901 Long term (current) use of anticoagulants: Secondary | ICD-10-CM

## 2018-03-03 DIAGNOSIS — E872 Acidosis: Secondary | ICD-10-CM | POA: Diagnosis present

## 2018-03-03 DIAGNOSIS — N179 Acute kidney failure, unspecified: Secondary | ICD-10-CM | POA: Diagnosis present

## 2018-03-03 DIAGNOSIS — Z9911 Dependence on respirator [ventilator] status: Secondary | ICD-10-CM | POA: Diagnosis not present

## 2018-03-03 DIAGNOSIS — D696 Thrombocytopenia, unspecified: Secondary | ICD-10-CM | POA: Diagnosis present

## 2018-03-03 DIAGNOSIS — R6521 Severe sepsis with septic shock: Secondary | ICD-10-CM | POA: Diagnosis present

## 2018-03-03 DIAGNOSIS — I10 Essential (primary) hypertension: Secondary | ICD-10-CM | POA: Diagnosis present

## 2018-03-03 DIAGNOSIS — R069 Unspecified abnormalities of breathing: Secondary | ICD-10-CM | POA: Diagnosis not present

## 2018-03-03 DIAGNOSIS — D72819 Decreased white blood cell count, unspecified: Secondary | ICD-10-CM

## 2018-03-03 DIAGNOSIS — I469 Cardiac arrest, cause unspecified: Secondary | ICD-10-CM | POA: Diagnosis not present

## 2018-03-03 DIAGNOSIS — E162 Hypoglycemia, unspecified: Secondary | ICD-10-CM | POA: Diagnosis present

## 2018-03-03 DIAGNOSIS — L03116 Cellulitis of left lower limb: Secondary | ICD-10-CM | POA: Diagnosis present

## 2018-03-03 DIAGNOSIS — R600 Localized edema: Secondary | ICD-10-CM | POA: Diagnosis present

## 2018-03-03 DIAGNOSIS — R590 Localized enlarged lymph nodes: Secondary | ICD-10-CM | POA: Diagnosis present

## 2018-03-03 DIAGNOSIS — R4701 Aphasia: Secondary | ICD-10-CM | POA: Diagnosis present

## 2018-03-03 DIAGNOSIS — D539 Nutritional anemia, unspecified: Secondary | ICD-10-CM | POA: Diagnosis present

## 2018-03-03 DIAGNOSIS — R0602 Shortness of breath: Secondary | ICD-10-CM | POA: Diagnosis not present

## 2018-03-03 DIAGNOSIS — I959 Hypotension, unspecified: Secondary | ICD-10-CM | POA: Diagnosis present

## 2018-03-03 DIAGNOSIS — E8770 Fluid overload, unspecified: Secondary | ICD-10-CM | POA: Diagnosis present

## 2018-03-03 DIAGNOSIS — L03115 Cellulitis of right lower limb: Secondary | ICD-10-CM | POA: Diagnosis present

## 2018-03-03 DIAGNOSIS — J9691 Respiratory failure, unspecified with hypoxia: Secondary | ICD-10-CM | POA: Diagnosis present

## 2018-03-03 DIAGNOSIS — A419 Sepsis, unspecified organism: Principal | ICD-10-CM

## 2018-03-03 DIAGNOSIS — J9601 Acute respiratory failure with hypoxia: Secondary | ICD-10-CM | POA: Diagnosis not present

## 2018-03-03 DIAGNOSIS — J9811 Atelectasis: Secondary | ICD-10-CM | POA: Diagnosis not present

## 2018-03-03 DIAGNOSIS — K219 Gastro-esophageal reflux disease without esophagitis: Secondary | ICD-10-CM | POA: Diagnosis present

## 2018-03-03 DIAGNOSIS — J9690 Respiratory failure, unspecified, unspecified whether with hypoxia or hypercapnia: Secondary | ICD-10-CM | POA: Diagnosis present

## 2018-03-03 DIAGNOSIS — K5900 Constipation, unspecified: Secondary | ICD-10-CM | POA: Diagnosis present

## 2018-03-03 DIAGNOSIS — K8020 Calculus of gallbladder without cholecystitis without obstruction: Secondary | ICD-10-CM | POA: Diagnosis not present

## 2018-03-03 DIAGNOSIS — K7030 Alcoholic cirrhosis of liver without ascites: Secondary | ICD-10-CM | POA: Diagnosis present

## 2018-03-03 DIAGNOSIS — Z87891 Personal history of nicotine dependence: Secondary | ICD-10-CM

## 2018-03-03 DIAGNOSIS — Z86718 Personal history of other venous thrombosis and embolism: Secondary | ICD-10-CM

## 2018-03-03 LAB — COMPREHENSIVE METABOLIC PANEL
ALBUMIN: 2.7 g/dL — AB (ref 3.5–5.0)
ALT: 35 U/L (ref 17–63)
AST: 87 U/L — AB (ref 15–41)
Alkaline Phosphatase: 62 U/L (ref 38–126)
Anion gap: 25 — ABNORMAL HIGH (ref 5–15)
BUN: 41 mg/dL — AB (ref 6–20)
CHLORIDE: 95 mmol/L — AB (ref 101–111)
CO2: 11 mmol/L — AB (ref 22–32)
Calcium: 9.3 mg/dL (ref 8.9–10.3)
Creatinine, Ser: 2.43 mg/dL — ABNORMAL HIGH (ref 0.61–1.24)
GFR calc Af Amer: 29 mL/min — ABNORMAL LOW (ref >60)
GFR calc non Af Amer: 25 mL/min — ABNORMAL LOW (ref >60)
GLUCOSE: 54 mg/dL — AB (ref 65–99)
POTASSIUM: 3.7 mmol/L (ref 3.5–5.1)
Sodium: 131 mmol/L — ABNORMAL LOW (ref 135–145)
Total Bilirubin: 2.2 mg/dL — ABNORMAL HIGH (ref 0.3–1.2)
Total Protein: 6.1 g/dL — ABNORMAL LOW (ref 6.5–8.1)

## 2018-03-03 LAB — CBC WITH DIFFERENTIAL/PLATELET
BASOS ABS: 0 10*3/uL (ref 0.0–0.1)
BLASTS: 0 %
Band Neutrophils: 23 %
Basophils Relative: 0 %
Eosinophils Absolute: 0 10*3/uL (ref 0.0–0.7)
Eosinophils Relative: 3 %
HCT: 41.9 % (ref 39.0–52.0)
Hemoglobin: 13.1 g/dL (ref 13.0–17.0)
LYMPHS PCT: 15 %
Lymphs Abs: 0.2 10*3/uL — ABNORMAL LOW (ref 0.7–4.0)
MCH: 25.7 pg — ABNORMAL LOW (ref 26.0–34.0)
MCHC: 31.3 g/dL (ref 30.0–36.0)
MCV: 82.3 fL (ref 78.0–100.0)
Metamyelocytes Relative: 38 %
Monocytes Absolute: 0 10*3/uL — ABNORMAL LOW (ref 0.1–1.0)
Monocytes Relative: 3 %
Myelocytes: 5 %
NEUTROS PCT: 13 %
NRBC: 0 /100{WBCs}
Neutro Abs: 1.2 10*3/uL — ABNORMAL LOW (ref 1.7–7.7)
OTHER: 0 %
PLATELETS: DECREASED 10*3/uL (ref 150–400)
PROMYELOCYTES RELATIVE: 0 %
RBC: 5.09 MIL/uL (ref 4.22–5.81)
RDW: 16.9 % — AB (ref 11.5–15.5)
WBC MORPHOLOGY: INCREASED
WBC: 1.4 10*3/uL — AB (ref 4.0–10.5)

## 2018-03-03 LAB — I-STAT CG4 LACTIC ACID, ED
LACTIC ACID, VENOUS: 15.88 mmol/L — AB (ref 0.5–1.9)
Lactic Acid, Venous: 10.48 mmol/L (ref 0.5–1.9)

## 2018-03-03 LAB — URINALYSIS, ROUTINE W REFLEX MICROSCOPIC
BILIRUBIN URINE: NEGATIVE
Glucose, UA: NEGATIVE mg/dL
HGB URINE DIPSTICK: NEGATIVE
Ketones, ur: NEGATIVE mg/dL
Leukocytes, UA: NEGATIVE
Nitrite: NEGATIVE
PH: 5 (ref 5.0–8.0)
Protein, ur: NEGATIVE mg/dL
SPECIFIC GRAVITY, URINE: 1.012 (ref 1.005–1.030)

## 2018-03-03 LAB — BLOOD CULTURE ID PANEL (REFLEXED)
ACINETOBACTER BAUMANNII: NOT DETECTED
CANDIDA KRUSEI: NOT DETECTED
CANDIDA PARAPSILOSIS: NOT DETECTED
CARBAPENEM RESISTANCE: NOT DETECTED
Candida albicans: NOT DETECTED
Candida glabrata: NOT DETECTED
Candida tropicalis: NOT DETECTED
ENTEROBACTERIACEAE SPECIES: DETECTED — AB
ENTEROCOCCUS SPECIES: NOT DETECTED
Enterobacter cloacae complex: NOT DETECTED
Escherichia coli: DETECTED — AB
HAEMOPHILUS INFLUENZAE: NOT DETECTED
KLEBSIELLA PNEUMONIAE: NOT DETECTED
Klebsiella oxytoca: NOT DETECTED
Listeria monocytogenes: NOT DETECTED
Neisseria meningitidis: NOT DETECTED
PSEUDOMONAS AERUGINOSA: NOT DETECTED
Proteus species: NOT DETECTED
SERRATIA MARCESCENS: NOT DETECTED
STAPHYLOCOCCUS AUREUS BCID: NOT DETECTED
STREPTOCOCCUS PNEUMONIAE: NOT DETECTED
STREPTOCOCCUS PYOGENES: NOT DETECTED
Staphylococcus species: NOT DETECTED
Streptococcus agalactiae: NOT DETECTED
Streptococcus species: NOT DETECTED

## 2018-03-03 LAB — COOXEMETRY PANEL
CARBOXYHEMOGLOBIN: 0.5 % (ref 0.5–1.5)
Methemoglobin: 1.6 % — ABNORMAL HIGH (ref 0.0–1.5)
O2 Saturation: 77.5 %
TOTAL HEMOGLOBIN: 11 g/dL — AB (ref 12.0–16.0)

## 2018-03-03 LAB — I-STAT ARTERIAL BLOOD GAS, ED
ACID-BASE DEFICIT: 17 mmol/L — AB (ref 0.0–2.0)
BICARBONATE: 13.2 mmol/L — AB (ref 20.0–28.0)
O2 Saturation: 99 %
TCO2: 15 mmol/L — ABNORMAL LOW (ref 22–32)
pCO2 arterial: 47.8 mmHg (ref 32.0–48.0)
pH, Arterial: 7.05 — CL (ref 7.350–7.450)
pO2, Arterial: 164 mmHg — ABNORMAL HIGH (ref 83.0–108.0)

## 2018-03-03 LAB — CBG MONITORING, ED
GLUCOSE-CAPILLARY: 28 mg/dL — AB (ref 65–99)
GLUCOSE-CAPILLARY: 24 mg/dL — AB (ref 65–99)
Glucose-Capillary: 76 mg/dL (ref 65–99)
Glucose-Capillary: 95 mg/dL (ref 65–99)

## 2018-03-03 LAB — AMMONIA: Ammonia: 81 umol/L — ABNORMAL HIGH (ref 9–35)

## 2018-03-03 LAB — I-STAT TROPONIN, ED: Troponin i, poc: 0.02 ng/mL (ref 0.00–0.08)

## 2018-03-03 LAB — STREP PNEUMONIAE URINARY ANTIGEN: Strep Pneumo Urinary Antigen: NEGATIVE

## 2018-03-03 MED ORDER — HEPARIN SODIUM (PORCINE) 5000 UNIT/ML IJ SOLN: 5000.0000 [IU] | Freq: Three times a day (TID) | INTRAMUSCULAR | Status: DC

## 2018-03-03 MED ORDER — ETOMIDATE 2 MG/ML IV SOLN
INTRAVENOUS | Status: AC | PRN
  Administered 2018-03-03: 10 mg via INTRAVENOUS

## 2018-03-03 MED ORDER — FENTANYL CITRATE (PF) 100 MCG/2ML IJ SOLN: 50.0000 ug | INTRAMUSCULAR | Status: DC | PRN

## 2018-03-03 MED ORDER — DEXTROSE 50 % IV SOLN
INTRAVENOUS | Status: AC
  Administered 2018-03-03: 50 mL via INTRAVENOUS
  Filled 2018-03-03: qty 50

## 2018-03-03 MED ORDER — FAMOTIDINE IN NACL 20-0.9 MG/50ML-% IV SOLN: 20.0000 mg | INTRAVENOUS | Status: DC

## 2018-03-03 MED ORDER — ACETAMINOPHEN 650 MG RE SUPP
650.0000 mg | Freq: Once | RECTAL | Status: AC
  Administered 2018-03-03: 650 mg via RECTAL

## 2018-03-03 MED ORDER — VANCOMYCIN HCL IN DEXTROSE 1-5 GM/200ML-% IV SOLN
1000.0000 mg | Freq: Once | INTRAVENOUS | Status: AC
  Administered 2018-03-03: 1000 mg via INTRAVENOUS
  Filled 2018-03-03: qty 200

## 2018-03-03 MED ORDER — SODIUM CHLORIDE 0.9 % IV SOLN
2.0000 g | Freq: Once | INTRAVENOUS | Status: AC
  Administered 2018-03-03: 2 g via INTRAVENOUS
  Filled 2018-03-03: qty 2

## 2018-03-03 MED ORDER — SODIUM BICARBONATE 8.4 % IV SOLN
INTRAVENOUS | Status: AC | PRN
  Administered 2018-03-03: 100 meq via INTRAVENOUS

## 2018-03-03 MED ORDER — NOREPINEPHRINE BITARTRATE 1 MG/ML IV SOLN
0.0000 ug/min | INTRAVENOUS | Status: DC
  Filled 2018-03-03: qty 4

## 2018-03-03 MED ORDER — SODIUM CHLORIDE 0.9 % IV SOLN: 1.0000 g | INTRAVENOUS | Status: DC

## 2018-03-03 MED ORDER — NOREPINEPHRINE BITARTRATE 1 MG/ML IV SOLN
0.0000 ug/min | Freq: Once | INTRAVENOUS | Status: AC
  Administered 2018-03-03: 15 ug/min via INTRAVENOUS
  Filled 2018-03-03: qty 4

## 2018-03-03 MED ORDER — FENTANYL CITRATE (PF) 100 MCG/2ML IJ SOLN
INTRAMUSCULAR | Status: AC
  Filled 2018-03-03: qty 2

## 2018-03-03 MED ORDER — FAMOTIDINE IN NACL 20-0.9 MG/50ML-% IV SOLN: 20.0000 mg | Freq: Two times a day (BID) | INTRAVENOUS | Status: DC

## 2018-03-03 MED ORDER — FENTANYL CITRATE (PF) 100 MCG/2ML IJ SOLN
INTRAMUSCULAR | Status: AC | PRN
  Administered 2018-03-03: 50 ug via INTRAVENOUS

## 2018-03-03 MED ORDER — MIDAZOLAM HCL 5 MG/5ML IJ SOLN
INTRAMUSCULAR | Status: AC | PRN
  Administered 2018-03-03: 2 mg via INTRAVENOUS

## 2018-03-03 MED ORDER — SODIUM CHLORIDE 0.9 % IV BOLUS (SEPSIS): 500.0000 mL | Freq: Once | INTRAVENOUS | Status: DC

## 2018-03-03 MED ORDER — DEXTROSE 50 % IV SOLN
1.0000 | Freq: Once | INTRAVENOUS | Status: AC
  Administered 2018-03-03 (×2): 50 mL via INTRAVENOUS
  Filled 2018-03-03: qty 50

## 2018-03-03 MED ORDER — MIDAZOLAM HCL 2 MG/2ML IJ SOLN: 1.0000 mg | INTRAMUSCULAR | Status: DC | PRN

## 2018-03-03 MED ORDER — MIDAZOLAM HCL 2 MG/2ML IJ SOLN
INTRAMUSCULAR | Status: AC
  Filled 2018-03-03: qty 2

## 2018-03-03 MED ORDER — SODIUM CHLORIDE 0.9 % IV BOLUS (SEPSIS)
1000.0000 mL | Freq: Once | INTRAVENOUS | Status: AC
  Administered 2018-03-03: 1000 mL via INTRAVENOUS

## 2018-03-03 MED ORDER — EPINEPHRINE PF 1 MG/10ML IJ SOSY
PREFILLED_SYRINGE | INTRAMUSCULAR | Status: AC | PRN
  Administered 2018-03-03: 1 mg via INTRAVENOUS

## 2018-03-03 MED ORDER — DEXTROSE 5 % IV SOLN
INTRAVENOUS | Status: DC
  Administered 2018-03-03: 14:00:00 via INTRAVENOUS

## 2018-03-03 MED ORDER — SODIUM BICARBONATE 8.4 % IV SOLN
100.0000 meq | Freq: Once | INTRAVENOUS | Status: AC
  Administered 2018-03-03: 100 meq via INTRAVENOUS

## 2018-03-03 MED ORDER — LIDOCAINE HCL (PF) 1 % IJ SOLN
5.0000 mL | Freq: Once | INTRAMUSCULAR | Status: DC
  Filled 2018-03-03: qty 5

## 2018-03-03 MED ORDER — SODIUM CHLORIDE 0.9 % IV BOLUS
2000.0000 mL | Freq: Once | INTRAVENOUS | Status: AC
  Administered 2018-03-03: 2000 mL via INTRAVENOUS

## 2018-03-03 MED ORDER — DOCUSATE SODIUM 50 MG/5ML PO LIQD
100.0000 mg | Freq: Two times a day (BID) | ORAL | Status: DC | PRN
  Filled 2018-03-03: qty 10

## 2018-03-03 MED ORDER — SODIUM BICARBONATE 8.4 % IV SOLN
INTRAVENOUS | Status: DC
  Administered 2018-03-03: 14:00:00 via INTRAVENOUS
  Filled 2018-03-03 (×2): qty 150

## 2018-03-03 MED ORDER — ROCURONIUM BROMIDE 50 MG/5ML IV SOLN
INTRAVENOUS | Status: AC | PRN
  Administered 2018-03-03: 80 mg via INTRAVENOUS

## 2018-03-03 MED ORDER — SODIUM CHLORIDE 0.9 % IV SOLN: 250.0000 mL | INTRAVENOUS | Status: DC | PRN

## 2018-03-03 MED ORDER — SODIUM CHLORIDE 0.9 % IV SOLN: INTRAVENOUS | Status: DC

## 2018-03-03 MED ORDER — VANCOMYCIN HCL IN DEXTROSE 1-5 GM/200ML-% IV SOLN: 1000.0000 mg | INTRAVENOUS | Status: DC

## 2018-03-04 LAB — URINE CULTURE
Culture: NO GROWTH
SPECIAL REQUESTS: NORMAL

## 2018-03-04 MED FILL — Medication: Qty: 1 | Status: AC

## 2018-03-05 LAB — CULTURE, BLOOD (ROUTINE X 2)
SPECIAL REQUESTS: ADEQUATE
Special Requests: ADEQUATE

## 2018-03-05 LAB — CULTURE, RESPIRATORY W GRAM STAIN: Special Requests: NORMAL

## 2018-03-05 LAB — CULTURE, RESPIRATORY: CULTURE: NORMAL

## 2018-03-06 ENCOUNTER — Telehealth: Payer: Self-pay | Admitting: Emergency Medicine

## 2018-03-07 LAB — I-STAT ARTERIAL BLOOD GAS, ED
ACID-BASE DEFICIT: 20 mmol/L — AB (ref 0.0–2.0)
BICARBONATE: 12 mmol/L — AB (ref 20.0–28.0)
O2 Saturation: 100 %
PH ART: 6.947 — AB (ref 7.350–7.450)
TCO2: 14 mmol/L — ABNORMAL LOW (ref 22–32)
pCO2 arterial: 55 mmHg — ABNORMAL HIGH (ref 32.0–48.0)
pO2, Arterial: 400 mmHg — ABNORMAL HIGH (ref 83.0–108.0)

## 2018-03-25 NOTE — ED Notes (Signed)
Pt without pulse, ventilator turned off- Dr. Sabra Heck pronounced pt. Deceased.

## 2018-03-25 NOTE — ED Provider Notes (Signed)
Medical screening examination/treatment/procedure(s) were conducted as a shared visit with non-physician practitioner(s) and myself.  I personally evaluated the patient during the encounter.  Clinical Impression:   Final diagnoses:  Septic shock ([place])  Leukopenia, unspecified type  AKI (acute kidney injury) ([place])   .Critical Care Performed by: Noemi Chapel, MD Authorized by: Noemi Chapel, MD   Critical care provider statement:    Critical care time (minutes):  35   Critical care time was exclusive of:  Separately billable procedures and treating other patients and teaching time   Critical care was necessary to treat or prevent imminent or life-threatening deterioration of the following conditions:  Renal failure and shock   Critical care was time spent personally by me on the following activities:  Blood draw for specimens, development of treatment plan with patient or surrogate, discussions with consultants, evaluation of patient's response to treatment, examination of patient, obtaining history from patient or surrogate, ordering and performing treatments and interventions, ordering and review of laboratory studies, ordering and review of radiographic studies, pulse oximetry, re-evaluation of patient's condition and review of old charts    Cardiopulmonary Resuscitation (CPR) Procedure Note Directed/Performed by: Johnna Acosta I personally directed ancillary staff and/or performed CPR in an effort to regain return of spontaneous circulation and to maintain cardiac, neuro and systemic perfusion.     The patient is a chronically ill-appearing 72 year old male, history of bilateral lower extremity chronic edema and lymphedema.  Is been treated for open wounds on his legs with antibiotics.  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  He appears more short of breath today.  The patient was hypoxic tachycardic and hypotensive.  His lung exam is consistent with having rhonchi in all lung fields, abdomen is soft, lower extremities appear to be bilaterally swollen without significant signs of overt cellulitis.  With significant abnormal labs including a leukopenia, thrombocytopenia, lactic acidosis which is significantly elevated over 15 and no liver dysfunction the patient has significant morbidity and mortality expected thus we have been aggressive with IV fluids including 30 cc/kg of IV fluids, antibiotics after cultures were drawn and will have to start vasopressors.  He does have endorgan dysfunction with kidney failure, he will not be able to have IV contrast for the CT scan of his abdomen.  He will likely need to go to the intensive care unit.   EKG Interpretation  Date/Time:  03/18/2018 13:51:52 EDT Ventricular Rate:  107 PR Interval:    QRS Duration: 75 QT Interval:  304 QTC Calculation: 406 R Axis:   75 Text Interpretation:  Sinus tachycardia Prolonged PR interval Low voltage, precordial leads Abnormal R-wave progression, early transition Nonspecific T abnormalities, inferior leads Since last tracing T wave abnormality now present. Confirmed by Noemi Chapel [phone]) on 2018/03/18 3:33:34 PM      The pt has developed hypoglycemia - glucose given,  I spoke with the ICU physician who will accept the patient to the intensive care unit.  Unfortunately, the patient required intubation by CCM, they also placed IV in the R IJ, pt had no sedation after intubation - eventually went into cardiac arrest - I directed CPR -  pt got epi - ROSC achieved, but pt appears severely ill.  Rhythm was wide complex PEA.   Ultimately the patient continued  to decompensate, time of death 2:37 PM.  I discussed this with the sister who is in total agreement.   Final diagnoses:  Septic shock ([place])  Leukopenia, unspecified type  AKI (acute kidney injury) ([place])      Noemi Chapel, MD 2018/03/04 1534

## 2018-03-25 NOTE — Progress Notes (Signed)
Pharmacy Antibiotic Note  Joshua [name] is a 72 y.o. male admitted on 03-30-2018 with sepsis.  Pharmacy has been consulted for Vancomycin/Cefepime dosing. SCr 2.43 (BL ~ 0.9). CrCl ~ 20-25 mL/min. WBC 1.4, LA 15.88.   Plan: -Vancomycin 1 gm IV Q 24 hours -Cefepime 1 gm IV Q 24 hours  -F/U CMET/renal function for additional dosing  Height: 5\' 10"  (177.8 cm) Weight: 190 lb (86.2 kg) IBW/kg (Calculated) : 73  Temp (24hrs), Avg:103.9 F (39.9 C), Min:103.9 F (39.9 C), Max:103.9 F (39.9 C)  Recent Labs  Lab 03/30/2018 0620 2018/03/30 0645  WBC 1.4*  --   CREATININE 2.43*  --   LATICACIDVEN  --  15.88*    Estimated Creatinine Clearance: 27.5 mL/min (A) (by C-G formula based on SCr of 2.43 mg/dL (H)).    Allergies  Allergen Reactions  . Codeine Itching     Albertina Parr, PharmD., BCPS Clinical Pharmacist Clinical phone for March 30, 2018 until 3:30pm: [phone] If after 3:30pm, please call main pharmacy at: [phone]

## 2018-03-25 NOTE — Progress Notes (Signed)
Responded to page to support sister at bedside. Patient passed.  Provided prayer, final blessing emotional, spiritual and grief support.    03/16/2018 1442  Clinical Encounter Type  Visited With Patient and family together;Health care provider  Visit Type Initial;Spiritual support;Death  Referral From Nurse  Spiritual Encounters  Spiritual Needs Prayer;Emotional;Grief support  Stress Factors  Family Stress Factors Loss  Cristopher Peru, [facility], Pager [phone]

## 2018-03-25 NOTE — ED Notes (Signed)
Pt to CT; Accompanied by this RN and Santiago Glad, RN

## 2018-03-25 NOTE — ED Provider Notes (Signed)
[facility] EMERGENCY DEPARTMENT Provider Note   CSN: [id number] Arrival date & time: 21-Mar-2018  2694     History   Chief Complaint Chief Complaint  Patient presents with  . Shortness of Breath    HPI Joshua [name] is a 72 y.o. male history cirrhosis, hypertension, lower extremity edema with chronic wounds who presents with 2-day history of shortness of breath and cough.  Patient denies any chest pain, abdominal pain, nausea, vomiting.  He is unaware of any fever at home.  He comes from [facility].  No medications given prior to arrival.  Patient was placed on CPAP by EMS.  According to patient's sister, who is patient's healthcare power of attorney, patient was having some increasing leg pain, however was still able to walk yesterday.  His shortness of breath was not significant yesterday when she saw him. He reportedly had a negative DVT study yesterday.   HPI  Past Medical History:  Diagnosis Date  . Anemia    low platelets/liver disease  . Arthritis   . Cirrhosis ([place])   . GERD (gastroesophageal reflux disease)   . Hepatitis C    has been treated with Harvoni  . Hypertension   . Increased ammonia level   . Narcolepsy   . Peripheral vascular disease ([place])    DVT on left side  . Wears glasses   . Wears partial dentures    top partial    Patient Active Problem List   Diagnosis Date Noted  . Respiratory failure ([place]) 03/21/18  . Olecranon fracture, left, closed, initial encounter 11/11/2017  . Hypokalemia 09/12/2017  . Goals of care, counseling/discussion   . Palliative care by specialist   . Altered mental status 08/03/2017  . Nonhealing nonsurgical wound 08/03/2017  . AKI (acute kidney injury) ([place]) 08/03/2017  . GERD (gastroesophageal reflux disease) 08/03/2017  . Acute hepatic encephalopathy 07/18/2017  . Hyponatremia 07/18/2017  . Pulmonary nodules 07/18/2017  . Hepatic encephalopathy ([place]) 07/18/2017  . Hyperammonemia  ([place]) 05/22/2014  . Acute encephalopathy 05/22/2014  . Renal failure (ARF), acute on chronic ([place]) 05/22/2014  . Cellulitis 02/17/2014  . Cirrhosis ([place]) 02/17/2014  . Hepatitis C 02/17/2014  . HTN (hypertension) 02/17/2014    Past Surgical History:  Procedure Laterality Date  . APPENDECTOMY    . COLONOSCOPY    . EYE SURGERY     both cataracts  . ORIF ELBOW FRACTURE Left 11/11/2017   Procedure: OPEN REDUCTION INTERNAL FIXATION LEFT OLECRANON FRACTURE;  Surgeon: Shona Needles, MD;  Location: [place];  Service: Orthopedics;  Laterality: Left;  . PILONIDAL CYST EXCISION  1960  . SHOULDER ARTHROSCOPY WITH SUBACROMIAL DECOMPRESSION, ROTATOR CUFF REPAIR AND BICEP TENDON REPAIR Left 08/09/2013   Procedure: LEFT SHOULDER ARTHROSCOPY WITH SUBACROMIAL DECOMPRESSION, DISTAL CLAVICULECTOMY;  Surgeon: Ninetta Lights, MD;  Location: [place];  Service: Orthopedics;  Laterality: Left;  . TONSILLECTOMY    . UPPER GI ENDOSCOPY          Home Medications    Prior to Admission medications   Medication Sig Start Date End Date Taking? Authorizing Provider  acetaminophen (TYLENOL) 325 MG tablet Take 1-2 tablets (325-650 mg total) by mouth every 8 (eight) hours as needed for moderate pain. 11/12/17 11/12/18 Yes Ainsley Spinner, PA-C  amphetamine-dextroamphetamine (ADDERALL) 10 MG tablet Take 20 mg by mouth daily.  06/10/12  Yes [provider]  bisacodyl (DULCOLAX) 10 MG suppository Place 10 mg as needed rectally for moderate constipation.  Yes [provider]  bisacodyl (DULCOLAX) 5 MG EC tablet Take 5 mg by mouth daily as needed for moderate constipation.   Yes [provider]  diphenhydrAMINE (BENADRYL) 25 MG tablet Take 25 mg by mouth every 6 (six) hours as needed for itching.   Yes [provider]  docusate sodium (COLACE) 100 MG capsule Take 100 mg 2 (two) times daily by mouth.   Yes [provider]  doxycycline (VIBRA-TABS) 100 MG tablet  Take 100 mg by mouth 2 (two) times daily. 03/02/18  Yes [provider]  furosemide (LASIX) 80 MG tablet Take 80 mg by mouth 2 (two) times daily.   Yes [provider]  hydrocortisone cream 1 % Apply 1 application 2 (two) times daily as needed topically for itching. For back   Yes [provider]  lactulose (CHRONULAC) 10 GM/15ML solution Take 45 mLs (30 g total) 4 (four) times daily by mouth. Titrate to have 3-4 BM's in a day 09/13/17  Yes Rai, Ripudeep K, MD  Magnesium Hydroxide (MILK OF MAGNESIA CONCENTRATE PO) Take 30 mLs daily as needed by mouth (constipation).   Yes [provider]  Melatonin 5 MG TABS Take 5 mg at bedtime by mouth.   Yes [provider]  Multiple Vitamins-Minerals (MULTIVITAMIN WITH MINERALS) tablet Take 1 tablet by mouth daily.   Yes [provider]  oxyCODONE (OXY IR/ROXICODONE) 5 MG immediate release tablet Take 5 mg by mouth every 6 (six) hours as needed for pain. 01/25/18  Yes [provider]  pantoprazole (PROTONIX) 40 MG tablet Take 40 mg by mouth daily. 06/28/17  Yes [provider]  Polyethyl Glycol-Propyl Glycol (SYSTANE OP) Place 1 drop into both eyes at bedtime.   Yes [provider]  potassium chloride SA (K-DUR,KLOR-CON) 20 MEQ tablet Take 20 mEq by mouth daily. 02/09/18  Yes [provider]  rifaximin (XIFAXAN) 200 MG tablet Take 2 tablets (400 mg total) by mouth 3 (three) times daily. 08/07/17  Yes Gherghe, Vella Redhead, MD  SODIUM FLUORIDE, DENTAL RINSE, (PREVIDENT) 0.2 % SOLN Place 1 mL onto teeth See admin instructions. Rinse with small amount for one minute and then spit; do not eat, drink or rinse for 30 minutes after using    Yes [provider]  spironolactone (ALDACTONE) 100 MG tablet Take 1 tablet (100 mg total) by mouth daily. Patient taking differently: Take 100 mg by mouth once.  08/07/17  Yes Gherghe, Vella Redhead, MD  traMADol (ULTRAM) 50 MG tablet Take 1 tablet (50  mg total) by mouth every 8 (eight) hours as needed for moderate pain or severe pain. 11/12/17  Yes Ainsley Spinner, PA-C  traZODone (DESYREL) 50 MG tablet Take 50 mg by mouth at bedtime as needed for sleep.   Yes [provider]  ELIQUIS STARTER PACK (ELIQUIS STARTER PACK) 5 MG TABS Take as directed on package: start with two-5mg  tablets twice daily for 7 days. On day 8, switch to one-5mg  tablet twice daily. 10/17/17   Drenda Freeze, MD  furosemide (LASIX) 40 MG tablet Take 1 tablet (40 mg total) daily by mouth. Patient not taking: Reported on 2018/03/28 09/14/17   Rai, Vernelle Emerald, MD  methocarbamol (ROBAXIN) 500 MG tablet Take 1 tablet (500 mg total) by mouth every 6 (six) hours as needed for muscle spasms. Patient not taking: Reported on 03/28/2018 11/12/17   Ainsley Spinner, PA-C    Family History Family History  Problem Relation Age of Onset  . Cirrhosis Mother   .  Cirrhosis Father   . CAD Neg Hx     Social History Social History   Tobacco Use  . Smoking status: Former Smoker    Packs/day: 1.00    Years: 45.00    Pack years: 45.00    Types: Cigarettes  . Smokeless tobacco: Never Used  Substance Use Topics  . Alcohol use: No    Frequency: Never    Comment: none now - lives in a SNF  . Drug use: No    Types: Marijuana    Comment: in the past, none now     Allergies   Codeine   Review of Systems Review of Systems  Constitutional: Negative for chills and fever.  HENT: Negative for facial swelling and sore throat.   Respiratory: Positive for cough and shortness of breath.   Cardiovascular: Negative for chest pain.  Gastrointestinal: Negative for abdominal pain, nausea and vomiting.  Genitourinary: Negative for dysuria.  Musculoskeletal: Negative for back pain.  Skin: Negative for rash and wound.  Neurological: Negative for headaches.  Psychiatric/Behavioral: The patient is not nervous/anxious.      Physical Exam Updated Vital Signs BP (!) 84/51   Pulse 70    Temp (!) 100.6 F (38.1 C) (Bladder)   Resp (!) 28   Ht 5\' 10"  (1.778 m)   Wt 86.2 kg (190 lb)   SpO2 91%   BMI 27.26 kg/m   Physical Exam  Constitutional: He appears well-developed and well-nourished. No distress.  HENT:  Head: Normocephalic and atraumatic.  Mouth/Throat: Oropharynx is clear and moist. No oropharyngeal exudate.  Eyes: Pupils are equal, round, and reactive to light. Conjunctivae are normal. Right eye exhibits no discharge. Left eye exhibits no discharge. No scleral icterus.  Neck: Normal range of motion. Neck supple. No thyromegaly present.  Cardiovascular: Normal rate, regular rhythm, normal heart sounds and intact distal pulses. Exam reveals no gallop and no friction rub.  No murmur heard. Pulmonary/Chest: Effort normal. No stridor. No respiratory distress. He has no wheezes. He has rales (throughout lung fields).  Abdominal: Soft. Bowel sounds are normal. He exhibits no distension. There is no tenderness. There is no rebound and no guarding.  Musculoskeletal:       Right lower leg: He exhibits tenderness and edema.       Left lower leg: He exhibits tenderness and edema.  Bilateral lower extremity edema with tenderness, symmetric, mild erythema, however more chronic appearing, no purulent drainage, but bleeding at 2 wounds on the right lower extremity (dressed in [place], which was removed by me to assess)  Lymphadenopathy:    He has no cervical adenopathy.  Neurological: He is alert. Coordination normal.  Skin: Skin is warm and dry. No rash noted. He is not diaphoretic. No pallor.  Psychiatric: He has a normal mood and affect.  Nursing note and vitals reviewed.    ED Treatments / Results  Labs (all labs ordered are listed, but only abnormal results are displayed) Labs Reviewed  CBC WITH DIFFERENTIAL/PLATELET - Abnormal; Notable for the following components:      Result Value   WBC 1.4 (*)    MCH 25.7 (*)    RDW 16.9 (*)    Neutro Abs 1.2 (*)    Lymphs  Abs 0.2 (*)    Monocytes Absolute 0.0 (*)    All other components within normal limits  COMPREHENSIVE METABOLIC PANEL - Abnormal; Notable for the following components:   Sodium 131 (*)    Chloride 95 (*)  CO2 11 (*)    Glucose, Bld 54 (*)    BUN 41 (*)    Creatinine, Ser 2.43 (*)    Total Protein 6.1 (*)    Albumin 2.7 (*)    AST 87 (*)    Total Bilirubin 2.2 (*)    GFR calc non Af Amer 25 (*)    GFR calc Af Amer 29 (*)    Anion gap 25 (*)    All other components within normal limits  AMMONIA - Abnormal; Notable for the following components:   Ammonia 81 (*)    All other components within normal limits  COOXEMETRY PANEL - Abnormal; Notable for the following components:   Total hemoglobin 11.0 (*)    Methemoglobin 1.6 (*)    All other components within normal limits  I-STAT CG4 LACTIC ACID, ED - Abnormal; Notable for the following components:   Lactic Acid, Venous 15.88 (*)    All other components within normal limits  I-STAT CG4 LACTIC ACID, ED - Abnormal; Notable for the following components:   Lactic Acid, Venous 10.48 (*)    All other components within normal limits  CBG MONITORING, ED - Abnormal; Notable for the following components:   Glucose-Capillary 28 (*)    All other components within normal limits  CBG MONITORING, ED - Abnormal; Notable for the following components:   Glucose-Capillary 24 (*)    All other components within normal limits  I-STAT ARTERIAL BLOOD GAS, ED - Abnormal; Notable for the following components:   pH, Arterial 7.050 (*)    pO2, Arterial 164.0 (*)    Bicarbonate 13.2 (*)    TCO2 15 (*)    Acid-base deficit 17.0 (*)    All other components within normal limits  CULTURE, BLOOD (ROUTINE X 2)  CULTURE, BLOOD (ROUTINE X 2)  CULTURE, BLOOD (ROUTINE X 2)  CULTURE, BLOOD (ROUTINE X 2)  URINE CULTURE  CULTURE, RESPIRATORY (NON-EXPECTORATED)  URINALYSIS, ROUTINE W REFLEX MICROSCOPIC  PROTIME-INR  APTT  COMPREHENSIVE METABOLIC PANEL    MAGNESIUM  PHOSPHORUS  PROCALCITONIN  CORTISOL  CBC  PROTIME-INR  APTT  STREP PNEUMONIAE URINARY ANTIGEN  BLOOD GAS, ARTERIAL  AMYLASE  LIPASE, BLOOD  TROPONIN I  TROPONIN I  TROPONIN I  LACTIC ACID, PLASMA  LACTIC ACID, PLASMA  URINALYSIS, ROUTINE W REFLEX MICROSCOPIC  AMMONIA  BLOOD GAS, ARTERIAL  I-STAT TROPONIN, ED  I-STAT CG4 LACTIC ACID, ED  CBG MONITORING, ED  CBG MONITORING, ED  I-STAT CG4 LACTIC ACID, ED  I-STAT CG4 LACTIC ACID, ED    EKG None  Radiology Ct Abdomen Pelvis Wo Contrast  Result Date: 03-19-18 CLINICAL DATA:  Onset of shortness of breath this morning with chest conjunction and crackles. Fever. Elevated lactic acid. EXAM: CT CHEST, ABDOMEN AND PELVIS WITHOUT CONTRAST TECHNIQUE: Multidetector CT imaging of the chest, abdomen and pelvis was performed following the standard protocol without IV contrast. COMPARISON:  CT chest 10/17/2017. FINDINGS: CT CHEST FINDINGS Cardiovascular: There is calcific aortic and coronary atherosclerosis. No aneurysm. Heart size is upper normal. No pericardial effusion. Mediastinum/Nodes: No enlarged mediastinal, hilar, or axillary lymph nodes. Thyroid gland, trachea, and esophagus demonstrate no significant findings. Lungs/Pleura: No pleural effusion. The lungs demonstrate only mild dependent atelectasis. Musculoskeletal: Gynecomastia is noted. Remote inferior endplate compression fracture of T12 is seen. No lytic or sclerotic lesion. CT ABDOMEN PELVIS FINDINGS Hepatobiliary: The liver is shrunken with a nodular border consistent with cirrhosis. Gastroesophageal varices are better visualized on the prior CT. No focal liver lesion is  seen on this uninfused exam. Biliary tree is unremarkable. Several small stones are seen lying dependently within the gallbladder but there is no CT evidence of cholecystitis. Pancreas: Unremarkable. No pancreatic ductal dilatation or surrounding inflammatory changes. Spleen: Normal in size without focal  abnormality. Adrenals/Urinary Tract: 2-3 punctate nonobstructing stones are seen in the left kidney and there is a single punctate nonobstructing stone in the right kidney. The kidneys are otherwise unremarkable. No ureteral stone or hydronephrosis. The adrenal glands appear normal. Stomach/Bowel: Colonic diverticulosis without diverticulitis is seen. The colon is otherwise unremarkable. The appendix has been removed. The stomach and small bowel appear normal. There is no pneumatosis, portal venous gas or free intraperitoneal air. Vascular/Lymphatic: Aortoiliac atherosclerosis is seen. There is retroperitoneal lymphadenopathy. Index left periaortic nodes on image 81 measure 1.7 and 1.9 cm short axis dimension Reproductive: Prostate is unremarkable. Other: No ascites. Musculoskeletal: No evidence of neoplastic process. Degenerative disc disease at L2-3 and L3-4 is worse at L3-4. 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 IMPRESSION: Retroperitoneal lymphadenopathy is most worrisome for lymphoproliferative disease. Cirrhotic liver with associated gastroesophageal varices. Nonobstructing punctate nonobstructing stones in the kidneys. Calcific aortic and coronary atherosclerosis. Gallstones without evidence cholecystitis. Gynecomastia. Electronically Signed   By: Inge Rise M.D.   On: Apr 01, 2018 09:43   Dg Tibia/fibula Left  Result Date: 2018-04-01 CLINICAL DATA:  Cellulitis of bilateral lower legs. EXAM: LEFT TIBIA AND FIBULA - 2 VIEW COMPARISON:  Radiographs of February 11, 2014. FINDINGS: There is no evidence of fracture or other focal bone lesions. Diffuse subcutaneous edema is noted. IMPRESSION: No fracture dislocation is noted. Diffuse subcutaneous edema is seen in the left lower extremity. Electronically Signed   By: Marijo Conception, M.D.   On: 01-Apr-2018 08:41   Dg Tibia/fibula Right  Result Date: 01-Apr-2018 CLINICAL DATA:  Bilateral lower leg  cellulitis. EXAM: RIGHT TIBIA AND FIBULA - 2 VIEW COMPARISON:  None. FINDINGS: There is no evidence of fracture or other focal bone lesions. Vascular calcifications are noted. Subcutaneous soft tissue edema is noted diffusely. IMPRESSION: No fracture or dislocation is noted. Diffuse subcutaneous soft tissue edema is noted in the right lower leg. Electronically Signed   By: Marijo Conception, M.D.   On: 01-Apr-2018 08:40   Ct Chest Wo Contrast  Result Date: 2018-04-01 CLINICAL DATA:  Onset of shortness of breath this morning with chest conjunction and crackles. Fever. Elevated lactic acid. EXAM: CT CHEST, ABDOMEN AND PELVIS WITHOUT CONTRAST TECHNIQUE: Multidetector CT imaging of the chest, abdomen and pelvis was performed following the standard protocol without IV contrast. COMPARISON:  CT chest 10/17/2017. FINDINGS: CT CHEST FINDINGS Cardiovascular: There is calcific aortic and coronary atherosclerosis. No aneurysm. Heart size is upper normal. No pericardial effusion. Mediastinum/Nodes: No enlarged mediastinal, hilar, or axillary lymph nodes. Thyroid gland, trachea, and esophagus demonstrate no significant findings. Lungs/Pleura: No pleural effusion. The lungs demonstrate only mild dependent atelectasis. Musculoskeletal: Gynecomastia is noted. Remote inferior endplate compression fracture of T12 is seen. No lytic or sclerotic lesion. CT ABDOMEN PELVIS FINDINGS Hepatobiliary: The liver is shrunken with a nodular border consistent with cirrhosis. Gastroesophageal varices are better visualized on the prior CT. No focal liver lesion is seen on this uninfused exam. Biliary tree is unremarkable. Several small stones are seen lying dependently within the gallbladder but there is no CT evidence of cholecystitis. Pancreas: Unremarkable. No pancreatic ductal dilatation or surrounding inflammatory changes. Spleen: Normal in size without focal abnormality. Adrenals/Urinary Tract: 2-3  punctate nonobstructing stones are seen in  the left kidney and there is a single punctate nonobstructing stone in the right kidney. The kidneys are otherwise unremarkable. No ureteral stone or hydronephrosis. The adrenal glands appear normal. Stomach/Bowel: Colonic diverticulosis without diverticulitis is seen. The colon is otherwise unremarkable. The appendix has been removed. The stomach and small bowel appear normal. There is no pneumatosis, portal venous gas or free intraperitoneal air. Vascular/Lymphatic: Aortoiliac atherosclerosis is seen. There is retroperitoneal lymphadenopathy. Index left periaortic nodes on image 81 measure 1.7 and 1.9 cm short axis dimension Reproductive: Prostate is unremarkable. Other: No ascites. Musculoskeletal: No evidence of neoplastic process. Degenerative disc disease at L2-3 and L3-4 is worse at L3-4. 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 IMPRESSION: Retroperitoneal lymphadenopathy is most worrisome for lymphoproliferative disease. Cirrhotic liver with associated gastroesophageal varices. Nonobstructing punctate nonobstructing stones in the kidneys. Calcific aortic and coronary atherosclerosis. Gallstones without evidence cholecystitis. Gynecomastia. Electronically Signed   By: Inge Rise M.D.   On: 03-30-2018 09:43   Dg Chest Portable 1 View  Result Date: 03-30-2018 CLINICAL DATA:  Status post right central line placement EXAM: PORTABLE CHEST 1 VIEW COMPARISON:  Film from earlier in the same day. FINDINGS: Cardiac shadow is stable. Endotracheal tube and right jugular central line are noted and in satisfactory position. No pneumothorax is seen. Minimal left basilar atelectasis is noted. IMPRESSION: Tubes and lines as described above. New mild left basilar atelectasis. Electronically Signed   By: Inez Catalina M.D.   On: March 30, 2018 12:17   Dg Chest Portable 1 View  Result Date: March 30, 2018 CLINICAL DATA:  Shortness of breath. EXAM: PORTABLE CHEST 1  VIEW COMPARISON:  Radiographs of July 18, 2017. FINDINGS: Stable cardiomediastinal silhouette. No pneumothorax or pleural effusion is noted. Lungs are clear. Bony thorax is unremarkable. IMPRESSION: No acute cardiopulmonary abnormality seen. Electronically Signed   By: Marijo Conception, M.D.   On: 2018-03-30 07:13    Procedures Procedures (including critical care time)  Medications Ordered in ED Medications  ceFEPIme (MAXIPIME) 1 g in sodium chloride 0.9 % 100 mL IVPB (has no administration in time range)  vancomycin (VANCOCIN) IVPB 1000 mg/200 mL premix (has no administration in time range)  lidocaine (PF) (XYLOCAINE) 1 % injection 5 mL (has no administration in time range)  heparin injection 5,000 Units (has no administration in time range)  famotidine (PEPCID) IVPB 20 mg premix (has no administration in time range)  fentaNYL (SUBLIMAZE) injection 50 mcg (has no administration in time range)  fentaNYL (SUBLIMAZE) injection 50 mcg (has no administration in time range)  midazolam (VERSED) injection 1 mg (has no administration in time range)  midazolam (VERSED) injection 1 mg (has no administration in time range)  docusate (COLACE) 50 MG/5ML liquid 100 mg (has no administration in time range)  norepinephrine (LEVOPHED) 4 mg in dextrose 5 % 250 mL (0.016 mg/mL) infusion (has no administration in time range)  sodium bicarbonate 150 mEq in dextrose 5 % 1,000 mL infusion ( Intravenous New Bag/Given 30-Mar-2018 1334)  dextrose 5 % solution ( Intravenous New Bag/Given 03/30/2018 1335)  acetaminophen (TYLENOL) suppository 650 mg (650 mg Rectal Given 2018/03/30 0616)  sodium chloride 0.9 % bolus 2,000 mL (0 mLs Intravenous Stopped March 30, 2018 0702)  ceFEPIme (MAXIPIME) 2 g in sodium chloride 0.9 % 100 mL IVPB (0 g Intravenous Stopped 03/30/2018 0736)  vancomycin (VANCOCIN) IVPB 1000 mg/200 mL premix (0 mg Intravenous Stopped 03-30-2018 0945)  sodium chloride 0.9 %  bolus 1,000 mL (0 mLs Intravenous Stopped 03-30-18  0807)  norepinephrine (LEVOPHED) 4 mg in dextrose 5 % 250 mL (0.016 mg/mL) infusion (40 mcg/min Intravenous Rate/Dose Change 30-Mar-2018 1340)  dextrose 50 % solution 50 mL (50 mLs Intravenous Given 03-30-2018 1203)  midazolam (VERSED) 5 MG/5ML injection (2 mg Intravenous Given 03-30-18 1038)  fentaNYL (SUBLIMAZE) injection (50 mcg Intravenous Given 2018/03/30 1039)  etomidate (AMIDATE) injection (10 mg Intravenous Given 03/30/2018 1039)  rocuronium (ZEMURON) injection (80 mg Intravenous Given 03/30/18 1040)  sodium bicarbonate injection 100 mEq (100 mEq Intravenous Given 03/30/2018 1246)  EPINEPHrine (ADRENALIN) 1 MG/10ML injection (1 mg Intravenous Given 03/30/2018 1346)  sodium bicarbonate injection (100 mEq Intravenous Given 30-Mar-2018 1349)     Initial Impression / Assessment and Plan / ED Course  I have reviewed the triage vital signs and the nursing notes.  Pertinent labs & imaging results that were available during my care of the patient were reviewed by me and considered in my medical decision making (see chart for details).  Clinical Course as of Mar 03 1432  30-Mar-2018  9518 Patient's blood pressures are not rising significantly after weight-based fluids, 3 L. Will initiate Levophed.   [AL]    Clinical Course User Index [AL] Frederica Kuster, PA-C    Patient with code sepsis.  Vancomycin and cefepime initiated for presumed pneumonia.  Initial chest x-ray is negative.  Patient presenting with shortness of breath.  He arrived via EMS on CPAP.  He was quickly transitioned to BiPAP.  Initial lactate 15.88.  WBC 1.4.  Decreased platelet counts.  CMP shows AKI, BUN 41, creatinine 2.43, sodium 131, chloride 95, protein 6.1, albumin 2.7, AST 85, total bilirubin 2.2, anion 25.  Blood cultures sent and pending.  Ammonia 81.  CT chest abdomen pelvis shows retroperitoneal lymphadenopathy most worrisome for lymphoproliferative disease, cirrhotic liver with associated gastroesophageal varices.  My attending,  Dr. Sabra Heck, consulted critical care who intubated the patient and will assume care.  We appreciate critical care medicine for assisting with this patient's care.  Patient also evaluated by Dr. Sabra Heck who guided the patient's management and agrees with plan.  After admission, patient went into cardiac arrest and code was run by Dr. Sabra Heck.  Unfortunately, after epi given 10 minutes of CPR, patient expired.  Please see Dr. Ammie Ferrier note for further discussion of this.  Death to get completed by Dr. Sabra Heck.   Final Clinical Impressions(s) / ED Diagnoses   Final diagnoses:  Septic shock ([place])  Leukopenia, unspecified type  AKI (acute kidney injury) [facility])    ED Discharge Orders    None       Frederica Kuster, PA-C 03/30/2018 1515    Noemi Chapel, MD 03/04/18 [phone]

## 2018-03-25 NOTE — ED Notes (Signed)
NS IV bolus infusing , 1st blood culture collected and sent to lab .

## 2018-03-25 NOTE — Procedures (Signed)
Intubation Procedure Note Joshua [name] [id number] 1946-04-07  Procedure: Intubation Indications: Respiratory insufficiency  Procedure Details Consent: Risks of procedure as well as the alternatives and risks of each were explained to the (patient/caregiver).  Consent for procedure obtained. Time Out: Verified patient identification, verified procedure, site/side was marked, verified correct patient position, special equipment/implants available, medications/allergies/relevent history reviewed, required imaging and test results available.  Performed  MAC and 3 Medications:  Fentanyl  Etomidate Versed NMB    Evaluation Hemodynamic Status: BP stable throughout; O2 sats: stable throughout Patient's Current Condition: stable Complications: No apparent complications Patient did tolerate procedure well. Chest X-ray ordered to verify placement.  CXR: pending.   Richardson Landry Minor ACNP [name] PCCM Pager [phone] till 3 pm If no answer page [phone] Mar 18, 2018, 11:29 AM

## 2018-03-25 NOTE — ED Notes (Signed)
Pt returned from CT °

## 2018-03-25 NOTE — H&P (Signed)
PULMONARY / CRITICAL CARE MEDICINE   Name: Joshua [name] MRN: [id number] DOB: 1945/12/08    ADMISSION DATE:  03-26-18   REFERRING MD: Emergency department physician  CHIEF COMPLAINT: Shortness of breath  HISTORY OF PRESENT ILLNESS:   72 year old male with up with plethora of health issues that include but are not limited to, cirrhosis alcoholic, being treated, hypertension, gastroesophageal reflux disease, chronic anemia, thrombocytopenia, chronic lower extremity edema and cellulitis.  He was in his usual state of poor health [facility] for over 3 to 48 hours and increasing shortness of breath.  He was transferred to [facility] emergency department.  He was noted to be hypotensive with a lactic acid of greater than 15.  He was in acute respiratory distress although he had been a DNR in the past for his sister's request she is a full code and was intubated and line was placed  He was placed on pressors and was given fluid resuscitation.  He is on chronic diuresis for his chronic lower extremity edema.  All diarrhea and diuretics have been held at this time.  Patient placed on broad-spectrum antibiotics panculture and is being transferred to the intensive care unit.  The sister says he would not want like prolonged life support but to give him an opportunity to get over this acute illness.  His baseline creatinine is 0.9 is now 2.88.  PAST MEDICAL HISTORY :  He  has a past medical history of Anemia, Arthritis, Cirrhosis ([place]), GERD (gastroesophageal reflux disease), Hepatitis C, Hypertension, Increased ammonia level, Narcolepsy, Peripheral vascular disease ([place]), Wears glasses, and Wears partial dentures.  PAST SURGICAL HISTORY: He  has a past surgical history that includes Tonsillectomy; Appendectomy; Eye surgery; Pilonidal cyst excision (1960); Colonoscopy; Upper gi endoscopy; Shoulder arthroscopy with subacromial decompression, rotator cuff repair and bicep tendon repair  (Left, 08/09/2013); and ORIF elbow fracture (Left, 11/11/2017).  Allergies  Allergen Reactions  . Codeine Itching    No current facility-administered medications on file prior to encounter.    Current Outpatient Medications on File Prior to Encounter  Medication Sig  . acetaminophen (TYLENOL) 325 MG tablet Take 1-2 tablets (325-650 mg total) by mouth every 8 (eight) hours as needed for moderate pain.  Marland Kitchen amphetamine-dextroamphetamine (ADDERALL) 10 MG tablet Take 20 mg by mouth daily.   . bisacodyl (DULCOLAX) 10 MG suppository Place 10 mg as needed rectally for moderate constipation.  . bisacodyl (DULCOLAX) 5 MG EC tablet Take 5 mg by mouth daily as needed for moderate constipation.  . diphenhydrAMINE (BENADRYL) 25 MG tablet Take 25 mg by mouth every 6 (six) hours as needed for itching.  . docusate sodium (COLACE) 100 MG capsule Take 100 mg 2 (two) times daily by mouth.  . doxycycline (VIBRA-TABS) 100 MG tablet Take 100 mg by mouth 2 (two) times daily.  . furosemide (LASIX) 80 MG tablet Take 80 mg by mouth 2 (two) times daily.  . hydrocortisone cream 1 % Apply 1 application 2 (two) times daily as needed topically for itching. For back  . lactulose (CHRONULAC) 10 GM/15ML solution Take 45 mLs (30 g total) 4 (four) times daily by mouth. Titrate to have 3-4 BM's in a day  . Magnesium Hydroxide (MILK OF MAGNESIA CONCENTRATE PO) Take 30 mLs daily as needed by mouth (constipation).  . Melatonin 5 MG TABS Take 5 mg at bedtime by mouth.  . Multiple Vitamins-Minerals (MULTIVITAMIN WITH MINERALS) tablet Take 1 tablet by mouth daily.  Marland Kitchen oxyCODONE (OXY IR/ROXICODONE) 5 MG  immediate release tablet Take 5 mg by mouth every 6 (six) hours as needed for pain.  . pantoprazole (PROTONIX) 40 MG tablet Take 40 mg by mouth daily.  Vladimir Faster Glycol-Propyl Glycol (SYSTANE OP) Place 1 drop into both eyes at bedtime.  . potassium chloride SA (K-DUR,KLOR-CON) 20 MEQ tablet Take 20 mEq by mouth daily.  . rifaximin  (XIFAXAN) 200 MG tablet Take 2 tablets (400 mg total) by mouth 3 (three) times daily.  . SODIUM FLUORIDE, DENTAL RINSE, (PREVIDENT) 0.2 % SOLN Place 1 mL onto teeth See admin instructions. Rinse with small amount for one minute and then spit; do not eat, drink or rinse for 30 minutes after using   . spironolactone (ALDACTONE) 100 MG tablet Take 1 tablet (100 mg total) by mouth daily. (Patient taking differently: Take 100 mg by mouth once. )  . traMADol (ULTRAM) 50 MG tablet Take 1 tablet (50 mg total) by mouth every 8 (eight) hours as needed for moderate pain or severe pain.  . traZODone (DESYREL) 50 MG tablet Take 50 mg by mouth at bedtime as needed for sleep.  Marland Kitchen ELIQUIS STARTER PACK (ELIQUIS STARTER PACK) 5 MG TABS Take as directed on package: start with two-5mg  tablets twice daily for 7 days. On day 8, switch to one-5mg  tablet twice daily.  . furosemide (LASIX) 40 MG tablet Take 1 tablet (40 mg total) daily by mouth. (Patient not taking: Reported on Mar 27, 2018)  . methocarbamol (ROBAXIN) 500 MG tablet Take 1 tablet (500 mg total) by mouth every 6 (six) hours as needed for muscle spasms. (Patient not taking: Reported on 03-27-18)    FAMILY HISTORY:  His indicated that the status of his mother is unknown. He indicated that the status of his father is unknown. He indicated that the status of his neg hx is unknown.   SOCIAL HISTORY: He  reports that he has quit smoking. His smoking use included cigarettes. He has a 45.00 pack-year smoking history. He has never used smokeless tobacco. He reports that he does not drink alcohol or use drugs.  REVIEW OF SYSTEMS:   Not available  SUBJECTIVE:  Elderly male in acute respiratory distress intubated and placed on pressors  VITAL SIGNS: BP (!) 84/51   Pulse (!) 112   Temp (!) 101.7 F (38.7 C) (Rectal)   Resp 18   Ht 5\' 10"  (1.778 m)   Wt 86.2 kg (190 lb)   SpO2 100%   BMI 27.26 kg/m   HEMODYNAMICS:    VENTILATOR SETTINGS: Vent Mode:  BIPAP;PCV FiO2 (%):  [30 %] 30 % Set Rate:  [8 bmp] 8 bmp PEEP:  [5 cmH20] 5 cmH20  INTAKE / OUTPUT: No intake/output data recorded.  PHYSICAL EXAMINATION: General: Frail elderly male appears older than stated age Neuro: Prior to intubation was confused but cooperative HEENT: No JVD is appreciated he has a right internal jugular vein central line placed Cardiovascular: Heart sounds regular rhythm rate and rhythm Lungs: Lungs are congested with rhonchi bilaterally Abdomen: Abdomen is slightly distended but nontender Musculoskeletal: Massive lower extremity edema wounds of the bandage Skin: Lower extremities are cool  LABS:  BMET Recent Labs  Lab 03-27-18 0620  NA 131*  K 3.7  CL 95*  CO2 11*  BUN 41*  CREATININE 2.43*  GLUCOSE 54*    Electrolytes Recent Labs  Lab 2018/03/27 0620  CALCIUM 9.3    CBC Recent Labs  Lab 03-27-2018 0620  WBC 1.4*  HGB 13.1  HCT 41.9  PLT PLATELET  CLUMPS NOTED ON SMEAR, COUNT APPEARS DECREASED    Coag's No results for input(s): APTT, INR in the last 168 hours.  Sepsis Markers Recent Labs  Lab 03/11/2018 0645  LATICACIDVEN 15.88*    ABG No results for input(s): PHART, PCO2ART, PO2ART in the last 168 hours.  Liver Enzymes Recent Labs  Lab 03-11-2018 0620  AST 87*  ALT 35  ALKPHOS 62  BILITOT 2.2*  ALBUMIN 2.7*    Cardiac Enzymes No results for input(s): TROPONINI, PROBNP in the last 168 hours.  Glucose Recent Labs  Lab 03-11-18 0931 11-Mar-2018 1011  GLUCAP 28* 76    Imaging Ct Abdomen Pelvis Wo Contrast  Result Date: 2018-03-11 CLINICAL DATA:  Onset of shortness of breath this morning with chest conjunction and crackles. Fever. Elevated lactic acid. EXAM: CT CHEST, ABDOMEN AND PELVIS WITHOUT CONTRAST TECHNIQUE: Multidetector CT imaging of the chest, abdomen and pelvis was performed following the standard protocol without IV contrast. COMPARISON:  CT chest 10/17/2017. FINDINGS: CT CHEST FINDINGS Cardiovascular: There  is calcific aortic and coronary atherosclerosis. No aneurysm. Heart size is upper normal. No pericardial effusion. Mediastinum/Nodes: No enlarged mediastinal, hilar, or axillary lymph nodes. Thyroid gland, trachea, and esophagus demonstrate no significant findings. Lungs/Pleura: No pleural effusion. The lungs demonstrate only mild dependent atelectasis. Musculoskeletal: Gynecomastia is noted. Remote inferior endplate compression fracture of T12 is seen. No lytic or sclerotic lesion. CT ABDOMEN PELVIS FINDINGS Hepatobiliary: The liver is shrunken with a nodular border consistent with cirrhosis. Gastroesophageal varices are better visualized on the prior CT. No focal liver lesion is seen on this uninfused exam. Biliary tree is unremarkable. Several small stones are seen lying dependently within the gallbladder but there is no CT evidence of cholecystitis. Pancreas: Unremarkable. No pancreatic ductal dilatation or surrounding inflammatory changes. Spleen: Normal in size without focal abnormality. Adrenals/Urinary Tract: 2-3 punctate nonobstructing stones are seen in the left kidney and there is a single punctate nonobstructing stone in the right kidney. The kidneys are otherwise unremarkable. No ureteral stone or hydronephrosis. The adrenal glands appear normal. Stomach/Bowel: Colonic diverticulosis without diverticulitis is seen. The colon is otherwise unremarkable. The appendix has been removed. The stomach and small bowel appear normal. There is no pneumatosis, portal venous gas or free intraperitoneal air. Vascular/Lymphatic: Aortoiliac atherosclerosis is seen. There is retroperitoneal lymphadenopathy. Index left periaortic nodes on image 81 measure 1.7 and 1.9 cm short axis dimension Reproductive: Prostate is unremarkable. Other: No ascites. Musculoskeletal: No evidence of neoplastic process. Degenerative disc disease at L2-3 and L3-4 is worse at L3-4. 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 IMPRESSION: Retroperitoneal lymphadenopathy is most worrisome for lymphoproliferative disease. Cirrhotic liver with associated gastroesophageal varices. Nonobstructing punctate nonobstructing stones in the kidneys. Calcific aortic and coronary atherosclerosis. Gallstones without evidence cholecystitis. Gynecomastia. Electronically Signed   By: Inge Rise M.D.   On: Mar 11, 2018 09:43   Dg Tibia/fibula Left  Result Date: Mar 11, 2018 CLINICAL DATA:  Cellulitis of bilateral lower legs. EXAM: LEFT TIBIA AND FIBULA - 2 VIEW COMPARISON:  Radiographs of February 11, 2014. FINDINGS: There is no evidence of fracture or other focal bone lesions. Diffuse subcutaneous edema is noted. IMPRESSION: No fracture dislocation is noted. Diffuse subcutaneous edema is seen in the left lower extremity. Electronically Signed   By: Marijo Conception, M.D.   On: 2018/03/11 08:41   Dg Tibia/fibula Right  Result Date: 03/11/18 CLINICAL DATA:  Bilateral lower leg cellulitis. EXAM: RIGHT TIBIA AND FIBULA - 2 VIEW  COMPARISON:  None. FINDINGS: There is no evidence of fracture or other focal bone lesions. Vascular calcifications are noted. Subcutaneous soft tissue edema is noted diffusely. IMPRESSION: No fracture or dislocation is noted. Diffuse subcutaneous soft tissue edema is noted in the right lower leg. Electronically Signed   By: Marijo Conception, M.D.   On: 03/26/2018 08:40   Ct Chest Wo Contrast  Result Date: 26-Mar-2018 CLINICAL DATA:  Onset of shortness of breath this morning with chest conjunction and crackles. Fever. Elevated lactic acid. EXAM: CT CHEST, ABDOMEN AND PELVIS WITHOUT CONTRAST TECHNIQUE: Multidetector CT imaging of the chest, abdomen and pelvis was performed following the standard protocol without IV contrast. COMPARISON:  CT chest 10/17/2017. FINDINGS: CT CHEST FINDINGS Cardiovascular: There is calcific aortic and coronary atherosclerosis. No aneurysm. Heart size is upper  normal. No pericardial effusion. Mediastinum/Nodes: No enlarged mediastinal, hilar, or axillary lymph nodes. Thyroid gland, trachea, and esophagus demonstrate no significant findings. Lungs/Pleura: No pleural effusion. The lungs demonstrate only mild dependent atelectasis. Musculoskeletal: Gynecomastia is noted. Remote inferior endplate compression fracture of T12 is seen. No lytic or sclerotic lesion. CT ABDOMEN PELVIS FINDINGS Hepatobiliary: The liver is shrunken with a nodular border consistent with cirrhosis. Gastroesophageal varices are better visualized on the prior CT. No focal liver lesion is seen on this uninfused exam. Biliary tree is unremarkable. Several small stones are seen lying dependently within the gallbladder but there is no CT evidence of cholecystitis. Pancreas: Unremarkable. No pancreatic ductal dilatation or surrounding inflammatory changes. Spleen: Normal in size without focal abnormality. Adrenals/Urinary Tract: 2-3 punctate nonobstructing stones are seen in the left kidney and there is a single punctate nonobstructing stone in the right kidney. The kidneys are otherwise unremarkable. No ureteral stone or hydronephrosis. The adrenal glands appear normal. Stomach/Bowel: Colonic diverticulosis without diverticulitis is seen. The colon is otherwise unremarkable. The appendix has been removed. The stomach and small bowel appear normal. There is no pneumatosis, portal venous gas or free intraperitoneal air. Vascular/Lymphatic: Aortoiliac atherosclerosis is seen. There is retroperitoneal lymphadenopathy. Index left periaortic nodes on image 81 measure 1.7 and 1.9 cm short axis dimension Reproductive: Prostate is unremarkable. Other: No ascites. Musculoskeletal: No evidence of neoplastic process. Degenerative disc disease at L2-3 and L3-4 is worse at L3-4. 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 IMPRESSION: Retroperitoneal lymphadenopathy is  most worrisome for lymphoproliferative disease. Cirrhotic liver with associated gastroesophageal varices. Nonobstructing punctate nonobstructing stones in the kidneys. Calcific aortic and coronary atherosclerosis. Gallstones without evidence cholecystitis. Gynecomastia. Electronically Signed   By: Inge Rise M.D.   On: 03-26-18 09:43   Dg Chest Portable 1 View  Result Date: 03/26/2018 CLINICAL DATA:  Shortness of breath. EXAM: PORTABLE CHEST 1 VIEW COMPARISON:  Radiographs of July 18, 2017. FINDINGS: Stable cardiomediastinal silhouette. No pneumothorax or pleural effusion is noted. Lungs are clear. Bony thorax is unremarkable. IMPRESSION: No acute cardiopulmonary abnormality seen. Electronically Signed   By: Marijo Conception, M.D.   On: 03/26/18 07:13     STUDIES:    CULTURES Sputum culture 26-Mar-2018 Blood cultures x2 03-26-18 Urinalysis 03-26-2018 Procalcitonin 2018-03-26 Lactic acid 03/26/18   ANTIBIOTICS: 03-26-18 vancomycin 14 2019 cefepime  SIGNIFICANT EVENTS: 26-Mar-2018 intubated  LINES/TUBES: 03/26/2018 endotracheal tube 03-26-18 right IJ CVL March 26, 2018 arterial line  DISCUSSION: 72 year old male with up with plethora of health issues that include but are not limited to, cirrhosis alcoholic, being treated, hypertension, gastroesophageal reflux disease, chronic anemia, thrombocytopenia, chronic lower extremity edema and cellulitis.  He was in his usual state of poor health [facility] for over 3 to 48 hours and increasing shortness of breath.  He was transferred to [facility] emergency department.  He was noted to be hypotensive with a lactic acid of greater than 15.  He was in acute respiratory distress although he had been a DNR in the past for his sister's request she is a full code and was intubated and line was placed  He was placed on pressors and was given fluid resuscitation.  He is on chronic diuresis for his chronic lower extremity  edema.  All diarrhea and diuretics have been held at this time.  Patient placed on broad-spectrum antibiotics panculture and is being transferred to the intensive care unit.  The sister says he would not want like prolonged life support but to give him an opportunity to get over this acute illness.  His baseline creatinine is 0.9 is now 2.88. ASSESSMENT / PLAN:  PULMONARY A: Vent dependent respiratory failure secondary to volume overload  sepsis Multiorgan dysfunction P:   Vent bundle Place arterial line monitor arterial blood gases adjustments of ventilator as needed   CARDIOVASCULAR A:  Chronic volume overload Shock most likely secondary to sepsis from infection P:  Fluid resuscitation Note he is on chronic Lasix and spironolactone will hold Vasopressors as needed Check oximetry 2D echo for heart function  RENAL Lab Results  Component Value Date   CREATININE 2.43 (H) 22-Mar-2018   CREATININE 0.90 12/19/2017   CREATININE 0.76 12/19/2017   Recent Labs  Lab 2018-03-22 0620  K 3.7    A:   Acute renal failure P:   Gentle hydration Avoid nephrotoxic agents  GASTROINTESTINAL A:   Alcoholic cirrhosis History of hepatitis C with treatment and resolution Chronic constipation P:    GI protectio monitor ammonia levels and liver function tests  HEMATOLOGIC Recent Labs    2018-03-22 0620  HGB 13.1   Lab Results  Component Value Date   INR 1.58 12/19/2017   INR 1.29 11/10/2017   INR 1.75 10/17/2017    A:   On chronic Eliquis. P:  Monitor hematological studies  INFECTIOUS A:   Presumed infection from lower extremity chronic cellulitis P:   Day 0 vancomycin and cefepime Pancultured 22-Mar-2018  ENDOCRINE CBG (last 3)  Recent Labs    2018-03-22 0931 Mar 22, 2018 1011  GLUCAP 28* 76    A:   Monitor glucose P:   Sliding scale insulin as needed Glucose as needed Frequent glucose checks  NEUROLOGIC A:   Noted to have altered mental status Will require  sedation for endotracheal tube tolerance P:   RASS goal: -1 Fentanyl Versed for sedation May need CT of head and future   FAMILY  - Updates: Sister updated at bedside on 03-22-2018.  Note he is now full code per his request.  - Inter-disciplinary family meet or Palliative Care meeting due by:  day 7      App cct 75 min  Richardson Landry Minor ACNP [name] PCCM Pager [phone] till 1 pm If no answer page [phone] 03-22-18, 11:43 AM

## 2018-03-25 NOTE — ED Notes (Signed)
2nd blood culture specimen sent nto lab , NS IV bolus infusing , Cefepime 2 grm IV infusing .

## 2018-03-25 NOTE — Progress Notes (Signed)
Clinical status much worse.  Refractory acidosis and shock.  Updated family.  Now dnr.    Chesley Mires, MD [facility] Pulmonary/Critical Care 03/17/2018, 2:08 PM

## 2018-03-25 NOTE — Progress Notes (Signed)
Pharmacy Antibiotic Note  Joshua [name] is a 72 y.o. male admitted on 2018/03/10 with sepsis.  Pharmacy has been consulted for Vancomycin/Cefepime dosing. CBC and CMET are both pending, lactic acid is markedly elevated.   Plan: -Vancomycin 1000 mg IV x 1 -Cefepime 2g IV x 1 -F/U CMET/renal function for additional dosing  Height: 5\' 10"  (177.8 cm) Weight: 190 lb (86.2 kg) IBW/kg (Calculated) : 73  Temp (24hrs), Avg:103.9 F (39.9 C), Min:103.9 F (39.9 C), Max:103.9 F (39.9 C)  Recent Labs  Lab 03/10/2018 0645  LATICACIDVEN 15.88*    CrCl cannot be calculated (Patient's most recent lab result is older than the maximum 21 days allowed.).    Allergies  Allergen Reactions  . Codeine Itching     Joshua [name] Mar 10, 2018 7:13 AM

## 2018-03-25 NOTE — ED Notes (Addendum)
Permit signed by pt's sister

## 2018-03-25 NOTE — Progress Notes (Signed)
Transported patient to CT Scan on vent with 100% FIO2 returned to 40% upon arrival back to ER.

## 2018-03-25 NOTE — ED Triage Notes (Signed)
Patient arrived EMS from [facility] staff reported SOB this morning with chest congestion/rales and crackles , placed on CPAP by EMS .

## 2018-03-25 NOTE — Procedures (Signed)
Central Venous Catheter Insertion Procedure Note Joshua [name] [id number] 07-11-1946  Procedure: Insertion of Central Venous Catheter Indications: Assessment of intravascular volume  Procedure Details Consent: Risks of procedure as well as the alternatives and risks of each were explained to the (patient/caregiver).  Consent for procedure obtained. Time Out: Verified patient identification, verified procedure, site/side was marked, verified correct patient position, special equipment/implants available, medications/allergies/relevent history reviewed, required imaging and test results available.  Performed Note unable to pass wire in LIJV. Maximum sterile technique was used including antiseptics, cap, gloves, gown, hand hygiene, mask and sheet. Skin prep: Chlorhexidine; local anesthetic administered A antimicrobial bonded/coated triple lumen catheter was placed in the right internal jugular vein using the Seldinger technique. Ultrasound guidance used.Yes.   Catheter placed to 16 cm. Blood aspirated via all 3 ports and then flushed x 3. Line sutured x 2 and dressing applied.  Evaluation Blood flow good Complications: No apparent complications Patient did tolerate procedure well. Chest X-ray ordered to verify placement.  CXR: pending.  [name] ACNP [name] PCCM Pager [phone] till 1 pm If no answer page [phone] 03-13-18, 11:30 AM

## 2018-03-25 DEATH — deceased

## 2018-04-25 ENCOUNTER — Ambulatory Visit: Payer: Medicare Other | Admitting: Gastroenterology

## 2019-06-24 IMAGING — CT CT ANGIO CHEST
2 of 6 series · 18 of 36 positions shown · IV contrast (Omni 300)
Comparison: None.

CLINICAL DATA: Lower extremity edema with apparent left lower
extremity DVT.

EXAM:
CT ANGIOGRAPHY CHEST WITH CONTRAST
TECHNIQUE: Multidetector CT imaging of the chest was performed using the
standard protocol during bolus administration of intravenous
contrast. Multiplanar CT image reconstructions and MIPs were
obtained to evaluate the vascular anatomy.
CONTRAST:  100mL 3GNA2C-190 IOPAMIDOL (3GNA2C-190) INJECTION 76%

[Series 10: pe thins · axial · 0.66mm/px · z∈[+1080,+1300]mm · 17 of 248 slices shown]
[im 14/248  lung]
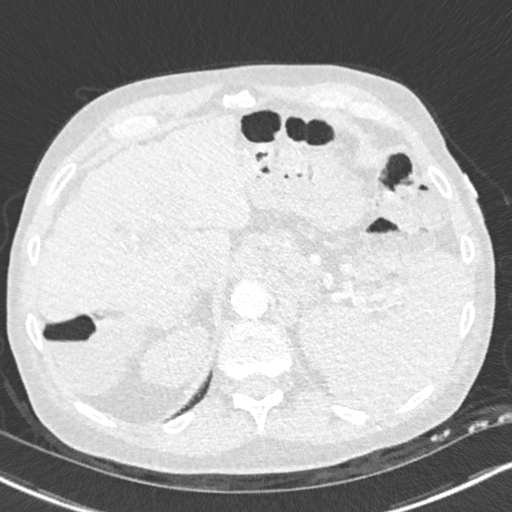
[im 28/248  mediastinal]
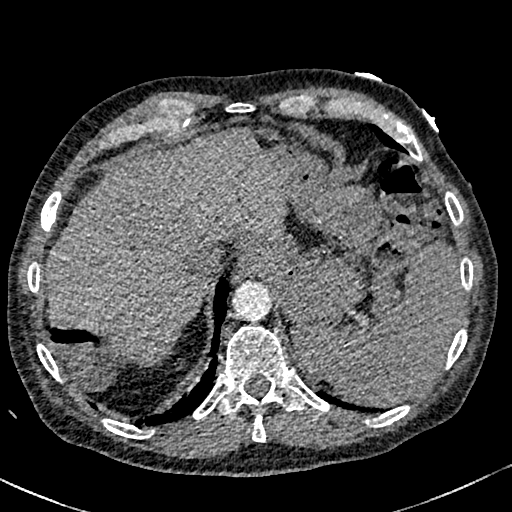
[im 42/248  lung]
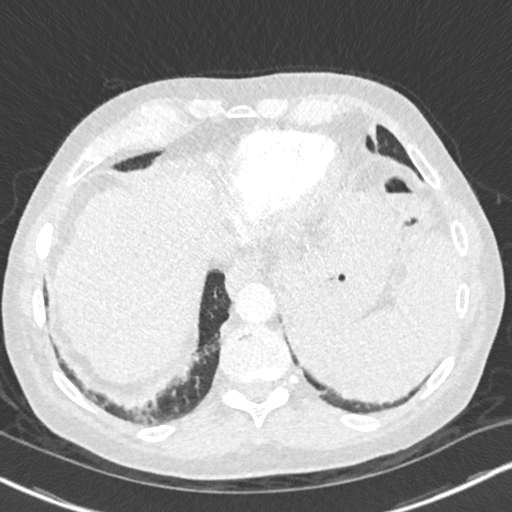
[im 55/248  mediastinal]
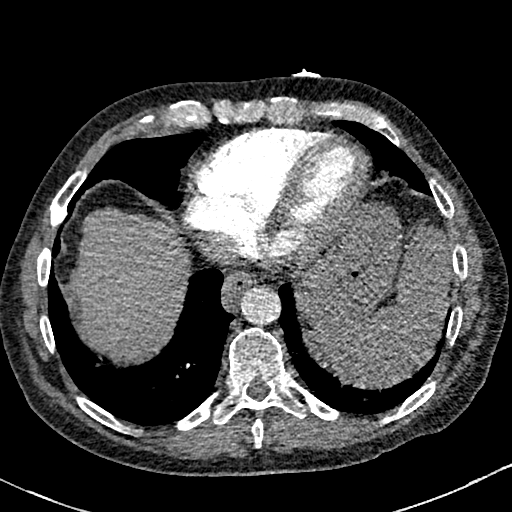
[im 69/248  lung]
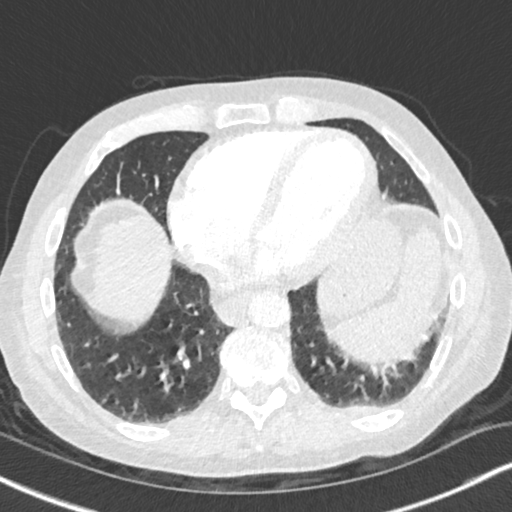
[im 83/248  mediastinal]
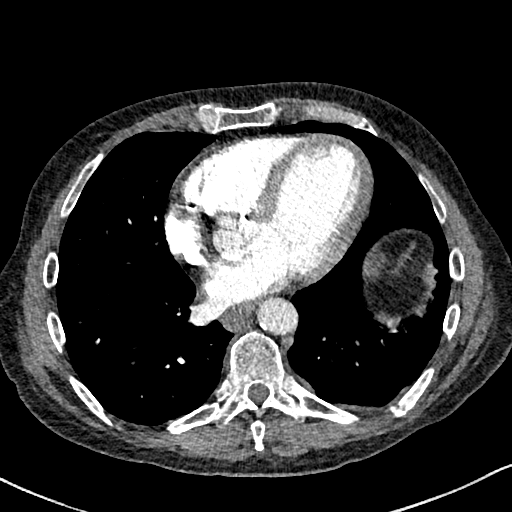
[im 97/248  lung]
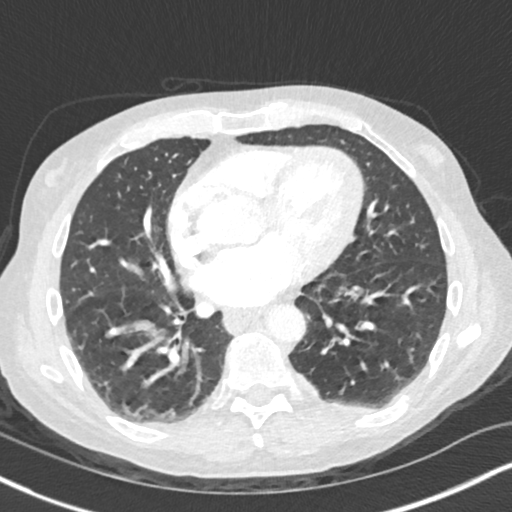
[im 110/248  mediastinal]
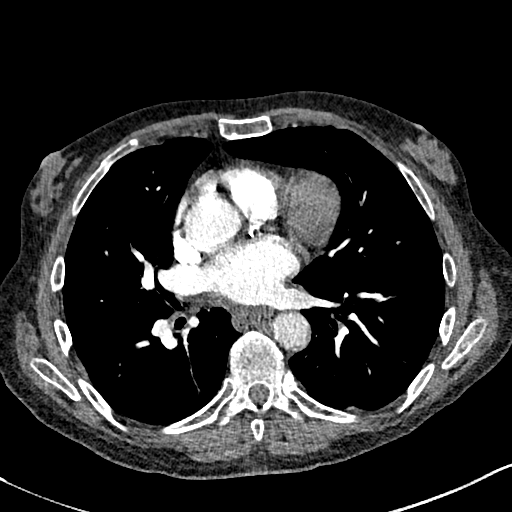
[im 124/248  lung]
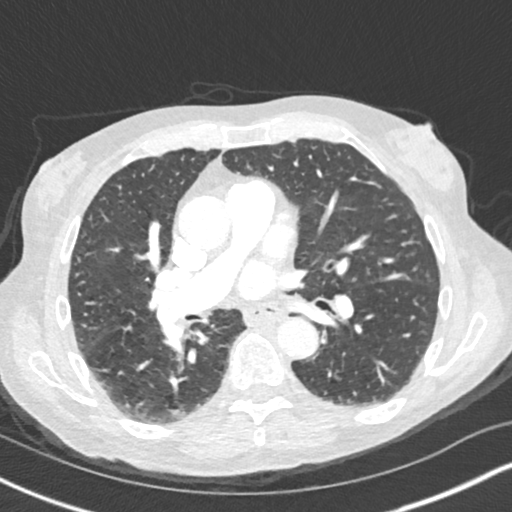
[im 138/248  mediastinal]
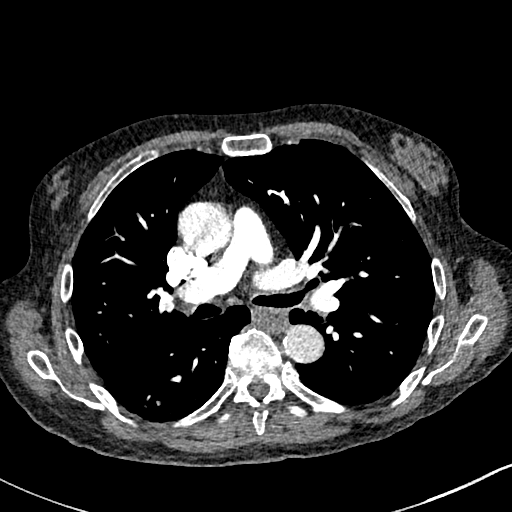
[im 151/248  lung]
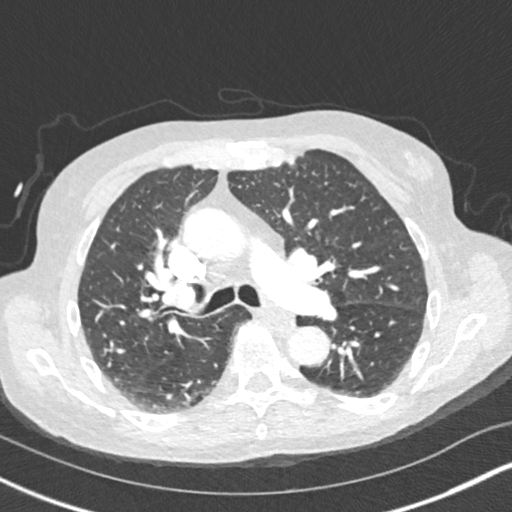
[im 165/248  mediastinal]
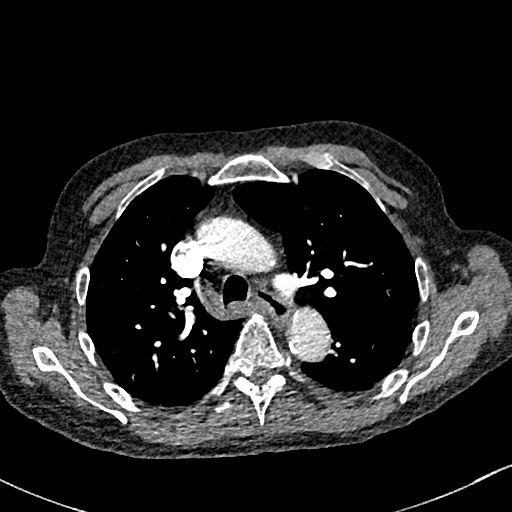
[im 179/248  lung]
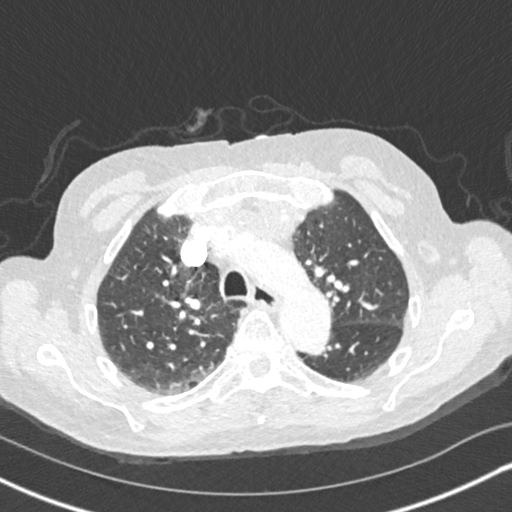
[im 193/248  mediastinal]
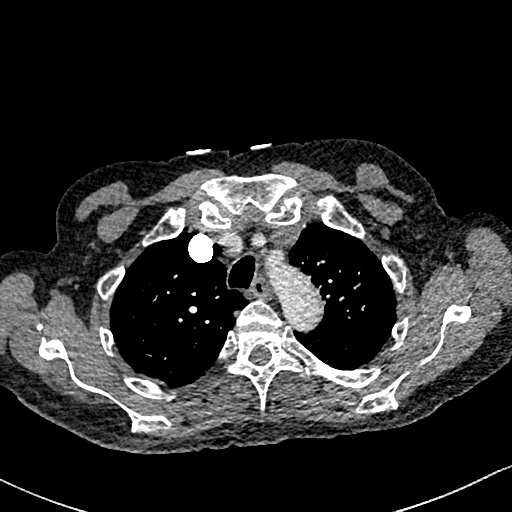
[im 206/248  lung]
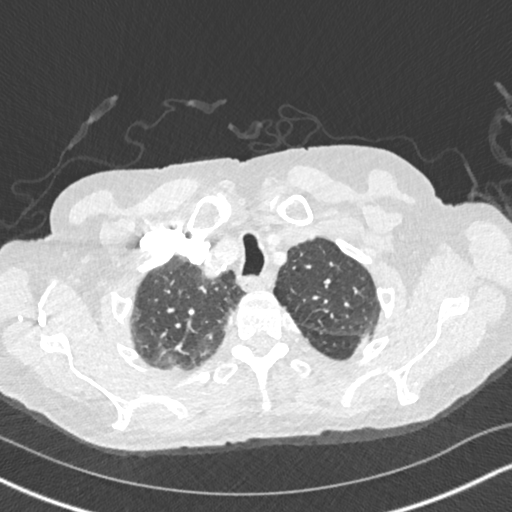
[im 220/248  mediastinal]
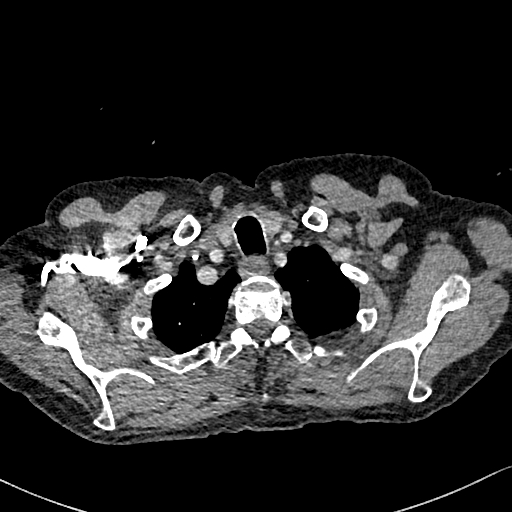
[im 234/248  lung]
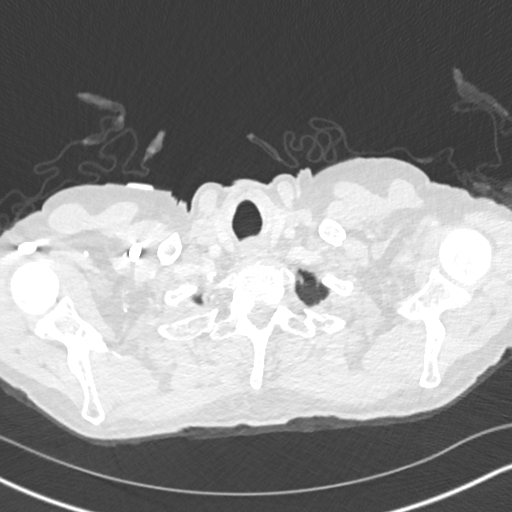

[Series 11: pe 2mm cor · coronal · 0.52mm/px · 1 of 147 slices shown]
[im 74/147  mediastinal]
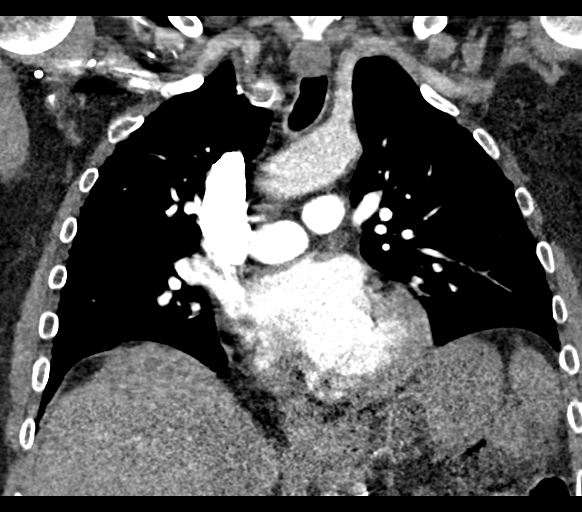

[18 of 36 positions shown; findings below may reference images not displayed]

FINDINGS: Cardiovascular: The pulmonary arteries are adequately opacified.
There is no evidence of pulmonary embolism. The thoracic aorta is
normal in caliber. The heart size is normal. No pericardial fluid.
Mild amount of calcified plaque is suspected in the distribution of
the LAD and left circumflex coronary arteries.

Mediastinum/Nodes: No evidence of mediastinal, hilar or axillary
lymphadenopathy.

Lungs/Pleura: Minimal scarring at the lung bases. There is no
evidence of pulmonary edema, consolidation, pneumothorax, nodule or
pleural fluid.

Upper Abdomen: The liver is overtly nodular, consistent with
cirrhosis. The spleen appears at least mildly enlarged. There may be
gastroesophageal varices. These are not opacified at the time of
imaging due to early contrast enhancement. No ascites is seen in the
upper abdomen.

Musculoskeletal: No chest wall abnormality. No acute or significant
osseous findings. Soft tissues show bilateral gynecomastia.

Review of the MIP images confirms the above findings.
IMPRESSION: 1. No evidence of pulmonary embolism or other acute pulmonary
process.
2. Coronary atherosclerosis with mild calcified plaque suspected in
the distribution of the LAD and left circumflex coronary arteries.
3. Cirrhotic liver splenomegaly.  Possible gastroesophageal varices.
